# Patient Record
Sex: Male | Born: 1950
Health system: Southern US, Community
[De-identification: ages and names within clinical notes are randomized; demographics above are authoritative.]

## PROBLEM LIST (undated history)

## (undated) DIAGNOSIS — J302 Other seasonal allergic rhinitis: Secondary | ICD-10-CM

## (undated) DIAGNOSIS — E785 Hyperlipidemia, unspecified: Secondary | ICD-10-CM

## (undated) DIAGNOSIS — K219 Gastro-esophageal reflux disease without esophagitis: Secondary | ICD-10-CM

## (undated) DIAGNOSIS — I10 Essential (primary) hypertension: Secondary | ICD-10-CM

## (undated) DIAGNOSIS — M1990 Unspecified osteoarthritis, unspecified site: Secondary | ICD-10-CM

## (undated) DIAGNOSIS — D119 Benign neoplasm of major salivary gland, unspecified: Secondary | ICD-10-CM

## (undated) DIAGNOSIS — T884XXA Failed or difficult intubation, initial encounter: Secondary | ICD-10-CM

## (undated) DIAGNOSIS — D369 Benign neoplasm, unspecified site: Secondary | ICD-10-CM

## (undated) DIAGNOSIS — M5090 Cervical disc disorder, unspecified, unspecified cervical region: Secondary | ICD-10-CM

## (undated) DIAGNOSIS — K635 Polyp of colon: Secondary | ICD-10-CM

## (undated) HISTORY — PX: OTHER SURGICAL HISTORY: SHX169

## (undated) HISTORY — DX: Benign neoplasm, unspecified site: D36.9

## (undated) HISTORY — DX: Benign neoplasm of major salivary gland, unspecified: D11.9

## (undated) HISTORY — PX: LIPOMA EXCISION: SHX5283

## (undated) HISTORY — DX: Hyperlipidemia, unspecified: E78.5

## (undated) HISTORY — DX: Other seasonal allergic rhinitis: J30.2

## (undated) HISTORY — DX: Essential (primary) hypertension: I10

## (undated) HISTORY — DX: Polyp of colon: K63.5

---

## 1971-12-20 HISTORY — PX: KNEE SURGERY: SHX244

## 1993-12-19 HISTORY — PX: APPENDECTOMY: SHX54

## 1999-05-11 ENCOUNTER — Ambulatory Visit (HOSPITAL_COMMUNITY): Admission: RE | Admit: 1999-05-11 | Discharge: 1999-05-11 | Payer: Self-pay | Admitting: Orthopedic Surgery

## 1999-05-11 ENCOUNTER — Encounter: Payer: Self-pay | Admitting: Orthopedic Surgery

## 1999-05-25 ENCOUNTER — Encounter: Payer: Self-pay | Admitting: Orthopedic Surgery

## 1999-05-25 ENCOUNTER — Ambulatory Visit (HOSPITAL_COMMUNITY): Admission: RE | Admit: 1999-05-25 | Discharge: 1999-05-25 | Payer: Self-pay | Admitting: Orthopedic Surgery

## 1999-06-08 ENCOUNTER — Encounter: Payer: Self-pay | Admitting: Orthopedic Surgery

## 1999-06-08 ENCOUNTER — Ambulatory Visit (HOSPITAL_COMMUNITY): Admission: RE | Admit: 1999-06-08 | Discharge: 1999-06-08 | Payer: Self-pay | Admitting: Orthopedic Surgery

## 2000-05-09 ENCOUNTER — Emergency Department (HOSPITAL_COMMUNITY): Admission: EM | Admit: 2000-05-09 | Discharge: 2000-05-09 | Payer: Self-pay | Admitting: *Deleted

## 2001-07-11 ENCOUNTER — Ambulatory Visit (HOSPITAL_COMMUNITY): Admission: RE | Admit: 2001-07-11 | Discharge: 2001-07-11 | Payer: Self-pay | Admitting: Surgery

## 2001-07-11 ENCOUNTER — Encounter (INDEPENDENT_AMBULATORY_CARE_PROVIDER_SITE_OTHER): Payer: Self-pay | Admitting: Specialist

## 2001-07-11 ENCOUNTER — Encounter: Payer: Self-pay | Admitting: Surgery

## 2001-12-19 HISTORY — PX: INGUINAL HERNIA REPAIR: SUR1180

## 2004-12-19 DIAGNOSIS — K635 Polyp of colon: Secondary | ICD-10-CM

## 2004-12-19 HISTORY — DX: Polyp of colon: K63.5

## 2004-12-19 HISTORY — PX: COLONOSCOPY W/ POLYPECTOMY: SHX1380

## 2005-01-13 ENCOUNTER — Ambulatory Visit (HOSPITAL_COMMUNITY): Admission: RE | Admit: 2005-01-13 | Discharge: 2005-01-13 | Payer: Self-pay | Admitting: *Deleted

## 2005-01-13 ENCOUNTER — Encounter (INDEPENDENT_AMBULATORY_CARE_PROVIDER_SITE_OTHER): Payer: Self-pay | Admitting: *Deleted

## 2005-09-23 ENCOUNTER — Ambulatory Visit (HOSPITAL_COMMUNITY): Admission: RE | Admit: 2005-09-23 | Discharge: 2005-09-23 | Payer: Self-pay | Admitting: Orthopedic Surgery

## 2005-09-23 ENCOUNTER — Ambulatory Visit (HOSPITAL_BASED_OUTPATIENT_CLINIC_OR_DEPARTMENT_OTHER): Admission: RE | Admit: 2005-09-23 | Discharge: 2005-09-23 | Payer: Self-pay | Admitting: Orthopedic Surgery

## 2005-12-19 HISTORY — PX: KNEE ARTHROSCOPY: SUR90

## 2007-07-19 ENCOUNTER — Emergency Department (HOSPITAL_COMMUNITY): Admission: EM | Admit: 2007-07-19 | Discharge: 2007-07-19 | Payer: Self-pay | Admitting: Emergency Medicine

## 2007-12-20 HISTORY — PX: REPLACEMENT TOTAL KNEE: SUR1224

## 2008-08-20 ENCOUNTER — Inpatient Hospital Stay (HOSPITAL_COMMUNITY): Admission: RE | Admit: 2008-08-20 | Discharge: 2008-08-23 | Payer: Self-pay | Admitting: Orthopedic Surgery

## 2008-08-24 ENCOUNTER — Inpatient Hospital Stay (HOSPITAL_COMMUNITY): Admission: EM | Admit: 2008-08-24 | Discharge: 2008-08-26 | Payer: Self-pay | Admitting: Emergency Medicine

## 2008-08-26 ENCOUNTER — Encounter (INDEPENDENT_AMBULATORY_CARE_PROVIDER_SITE_OTHER): Payer: Self-pay | Admitting: Orthopedic Surgery

## 2008-08-26 ENCOUNTER — Ambulatory Visit: Payer: Self-pay | Admitting: Surgery

## 2008-12-19 HISTORY — PX: CERVICAL LAMINECTOMY: SHX94

## 2009-04-23 ENCOUNTER — Encounter: Admission: RE | Admit: 2009-04-23 | Discharge: 2009-04-23 | Payer: Self-pay | Admitting: Orthopedic Surgery

## 2009-04-27 ENCOUNTER — Ambulatory Visit (HOSPITAL_COMMUNITY): Admission: RE | Admit: 2009-04-27 | Discharge: 2009-04-28 | Payer: Self-pay | Admitting: Neurosurgery

## 2009-06-12 ENCOUNTER — Encounter: Admission: RE | Admit: 2009-06-12 | Discharge: 2009-06-12 | Payer: Self-pay | Admitting: Neurosurgery

## 2010-02-26 ENCOUNTER — Encounter (INDEPENDENT_AMBULATORY_CARE_PROVIDER_SITE_OTHER): Payer: Self-pay | Admitting: *Deleted

## 2010-03-04 ENCOUNTER — Ambulatory Visit: Payer: Self-pay | Admitting: Internal Medicine

## 2010-03-04 DIAGNOSIS — Z86010 Personal history of colonic polyps: Secondary | ICD-10-CM

## 2010-03-04 DIAGNOSIS — I10 Essential (primary) hypertension: Secondary | ICD-10-CM

## 2010-03-04 DIAGNOSIS — J309 Allergic rhinitis, unspecified: Secondary | ICD-10-CM

## 2010-03-04 DIAGNOSIS — E785 Hyperlipidemia, unspecified: Secondary | ICD-10-CM | POA: Insufficient documentation

## 2010-03-09 ENCOUNTER — Encounter (INDEPENDENT_AMBULATORY_CARE_PROVIDER_SITE_OTHER): Payer: Self-pay | Admitting: *Deleted

## 2010-03-18 ENCOUNTER — Encounter (INDEPENDENT_AMBULATORY_CARE_PROVIDER_SITE_OTHER): Payer: Self-pay | Admitting: *Deleted

## 2010-03-22 ENCOUNTER — Ambulatory Visit: Payer: Self-pay | Admitting: Gastroenterology

## 2010-03-31 ENCOUNTER — Ambulatory Visit: Payer: Self-pay | Admitting: Gastroenterology

## 2010-07-08 ENCOUNTER — Ambulatory Visit: Payer: Self-pay | Admitting: Internal Medicine

## 2010-07-13 ENCOUNTER — Encounter: Payer: Self-pay | Admitting: Internal Medicine

## 2010-07-21 ENCOUNTER — Ambulatory Visit: Payer: Self-pay | Admitting: Internal Medicine

## 2011-01-16 LAB — CONVERTED CEMR LAB
BUN: 22 mg/dL (ref 6–23)
Calcium: 10.3 mg/dL (ref 8.4–10.5)
GFR calc non Af Amer: 76.44 mL/min (ref >60)
Glucose, Bld: 99 mg/dL (ref 70–99)
Hgb A1c MFr Bld: 5.8 % (ref 4.6–6.5)

## 2011-01-18 NOTE — Assessment & Plan Note (Signed)
Summary: new to est//bcbs ins//lch   Vital Signs:  Patient profile:   60 year old male Height:      67.25 inches Weight:      192.4 pounds BMI:     30.02 Temp:     98.6 degrees F oral Pulse rate:   64 / minute Resp:     14 per minute BP sitting:   120 / 76  (left arm) Cuff size:   large  Vitals Entered By: Shonna Chock (March 04, 2010 2:03 PM)  CC: New Patient , General Medical Evaluation   CC:  New Patient  and General Medical Evaluation.  History of Present Illness: Ryan Cobb is here  to establish as a new patient ; Lipitor 20 mg  was Rxed 30 days ago for LDL  of 147. Previously on Red Yeast Rice; LDL gradually increased since he quit running.  Preventive Screening-Counseling & Management  Alcohol-Tobacco     Smoking Status: quit  Caffeine-Diet-Exercise     Does Patient Exercise: yes  Allergies (verified): No Known Drug Allergies  Past History:  Past Medical History: Allergic rhinitis Hyperlipidemia Hypertension Colonic polyps, hx of  Past Surgical History: R knee arthroscopy 2007;L knee reconstruction 1973;   L Total knee replacement 2009; Lipoma ectomy R waist Cervical laminectomy 2010, Dr Channing Mutters Inguinal herniorrhaphy Colon polypectomy 2006, due 2011  Family History: Father: Deceased-Alcoholism, DM;  PGM :Elevated lipids;PGF:Elevated lipids,Angina,HTN,  lung CA Mother: Deceased-Alcoholism, cirrhosis; MGM:neg; MGF : lipids,CVA Siblings:  sister : Breast Cancer  Social History: 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[employer] Married Former Smoker total < 20 yrs(off for 25 yr span) Alcohol use-no Regular exercise-yes: biking, elliptical Smoking Status:  quit Does Patient Exercise:  yes  Review of Systems  The patient denies anorexia, fever, vision loss, decreased hearing, hoarseness, chest pain, syncope, dyspnea on exertion, peripheral edema, prolonged cough, headaches, hemoptysis, abdominal pain, melena, hematochezia, severe indigestion/heartburn, hematuria,  incontinence, suspicious skin lesions, depression, unusual weight change, abnormal bleeding, enlarged lymph nodes, and angioedema.         Weight up 10# over past year. Nose bleeds in Winter. Lexapro was initiated in 2009 due to work & finances stresses, "situational anxiety". He has taken it irregularly.  Physical Exam  General:  well-nourished; alert,appropriate and cooperative throughout examination Head:  Normocephalic and atraumatic without obvious abnormalities.  Eyes:  No corneal or conjunctival inflammation noted.Marland Kitchen Perrla. Funduscopic exam benign, without hemorrhages, exudates or papilledema.  Ears:  External ear exam shows no significant lesions or deformities.  Otoscopic examination reveals clear canals, tympanic membranes are intact bilaterally without bulging, retraction, inflammation or discharge. Hearing is grossly normal bilaterally. Nose:  External nasal examination shows no deformity or inflammation. Nasal mucosa are pink and moist without lesions or exudates. Mouth:  Oral mucosa and oropharynx without lesions or exudates.  Teeth in good repair. Neck:  No deformities, masses, or tenderness noted. Lungs:  Normal respiratory effort, chest expands symmetrically. Lungs are clear to auscultation, no crackles or wheezes. Heart:  Normal rate and regular rhythm.Increased  S1 ; S2 normal without gallop, murmur, click, rub or other extra sounds. Abdomen:  Bowel sounds positive,abdomen soft and non-tender without masses, organomegaly or hernias noted. Prostate:  Done in Fall; PSA was normal Pulses:  R and L carotid,radial,dorsalis pedis and posterior tibial pulses are full and equal bilaterally Extremities:  No clubbing, cyanosis, edema. SEVERE crepitus L knee Neurologic:  alert & oriented X3 and DTRs symmetrical and normal.   Skin:  Intact without suspicious lesions or rashes  Cervical Nodes:  No lymphadenopathy noted Axillary Nodes:  No palpable lymphadenopathy Psych:  memory intact for  recent and remote, normally interactive, good eye contact, not anxious appearing, and not depressed appearing.     Impression & Recommendations:  Problem # 1:  HYPERLIPIDEMIA (ICD-272.4)  The following medications were removed from the medication list:    Lipitor 20 Mg Tabs (Atorvastatin calcium) .Marland Kitchen... 1 by mouth once daily  Problem # 2:  HYPERTENSION (ICD-401.9) controlled His updated medication list for this problem includes:    Lisinopril-hydrochlorothiazide 20-25 Mg Tabs (Lisinopril-hydrochlorothiazide) .Marland Kitchen... 1 by mouth once daily  Problem # 3:  COLONIC POLYPS, HX OF (ICD-V12.72) F/U as per GI  Problem # 4:  ALLERGIC RHINITIS (ICD-477.9)  His updated medication list for this problem includes:    Loratadine 10 Mg Tabs (Loratadine) .Marland Kitchen... 1 by mouth once daily  Complete Medication List: 1)  Singulair 10 Mg Tabs (Montelukast sodium) .Marland Kitchen.. 1 by mouth once daily 2)  Lisinopril-hydrochlorothiazide 20-25 Mg Tabs (Lisinopril-hydrochlorothiazide) .Marland Kitchen.. 1 by mouth once daily 3)  Lexapro 5 Mg Tabs (Escitalopram oxalate) .Marland Kitchen.. 1 by mouth once daily 4)  Aspirin 81 Mg Tabs (Aspirin) .Marland Kitchen.. 1 by mouth once daily 5)  Loratadine 10 Mg Tabs (Loratadine) .Marland Kitchen.. 1 by mouth once daily  Patient Instructions: 1)  Read Dr Gildardo Griffes Eat,Drink & Be Healthy. CVE 3-4 X/week X 30-45 min. 2)  Please schedule a follow-up appointment in 4 months. 3)  BMP prior to visit, ICD-9:401.9  4)  NMR Lipoprofile Lipid Panel prior to visit, ICD-9:272.4 5)  HbgA1C prior to visit, ICD-9:272.4. Weaning trial of Lexapro. 6)  Schedule a colonoscopy as per [facility]  as discussed  to help detect colon cancer. 7)  Take  3  coated  Aspirin 81 mg  every day. Prescriptions: LISINOPRIL-HYDROCHLOROTHIAZIDE 20-25 MG TABS (LISINOPRIL-HYDROCHLOROTHIAZIDE) 1 by mouth once daily  #90 x 1   Entered and Authorized by:   Marga Melnick MD   Signed by:   Marga Melnick MD on 03/04/2010   Method used:   Faxed to ...       [facility]*  (retail)       [address]       [place], [place]  [id number]       Ph: [phone]       Fax: [phone]   RxID:   [id number] SINGULAIR 10 MG TABS (MONTELUKAST SODIUM) 1 by mouth once daily  #90 x 3   Entered and Authorized by:   Marga Melnick MD   Signed by:   Marga Melnick MD on 03/04/2010   Method used:   Faxed to ...       [facility]* (retail)       [address]       [place], [place]  [address]       Ph: [phone]       Fax: [phone]   RxID:   [id number]

## 2011-01-18 NOTE — Letter (Signed)
Summary: New Patient Letter  [facility] [place]  [address] [place], [place] [address]   Phone: [phone]  Fax: [phone]       02/26/2010 MRN: [id number]  Ryan Cobb [address] [place], [place]  [address]  Dear [name],   Welcome to [facility] and thank you for choosing [place] as your Primary Care Providers. Enclosed you will find information about our practice that we hope you find helpful. We have also enclosed forms to be filled out prior to your visit. This will provide [place] with the necessary information and facilitate your being seen in a timely manner. If you have any questions, please call us at:  [phone] and we will be happy to assist you. We look forward to seeing you at your scheduled appointment time.  Appointment   Thursday Mrch 17th @ 2:00pm  with Dr.  Alwyn Ren         Sincerely,  Primary Health Care Team  Please arrive 15 minutes early for your first appointment and bring your insurance card. Co-pay is required at the time of your visit.  *****Please call the office if you are not able to keep this appointment. There is a charge of $50.00 if any appointment is not cancelled or rescheduled within 24 hours.

## 2011-01-18 NOTE — Miscellaneous (Signed)
Summary: previsit  Clinical Lists Changes  Medications: Added new medication of MOVIPREP 100 GM  SOLR (PEG-KCL-NACL-NASULF-NA ASC-C) As directed - Signed Rx of MOVIPREP 100 GM  SOLR (PEG-KCL-NACL-NASULF-NA ASC-C) As directed;  #1 x 0;  Signed;  Entered by: Clide Cliff RN;  Authorized by: Mardella Layman MD [facility];  Method used: Electronically to [facility]*, [address] [place], [place]  [address], Ph: [phone], Fax: [phone] Observations: Added new observation of ALLERGY REV: Done (03/22/2010 16:14)    Prescriptions: MOVIPREP 100 GM  SOLR (PEG-KCL-NACL-NASULF-NA ASC-C) As directed  #1 x 0   Entered by:   Clide Cliff RN   Authorized by:   Mardella Layman MD [facility]   Signed by:   Clide Cliff RN on 03/22/2010   Method used:   Electronically to        [facility]* (retail)       [address]       [place], [place]  [address]       Ph: [phone]       Fax: [phone]   RxID:   [id number]

## 2011-01-18 NOTE — Procedures (Signed)
Summary: Colonoscopy  Patient: Ryan Cobb Note: All result statuses are Final unless otherwise noted.  Tests: (1) Colonoscopy (COL)   COL Colonoscopy           DONE     [facility]     [address]     [place], [place]  [address]           COLONOSCOPY PROCEDURE REPORT           PATIENT:  Ryan Cobb, Ryan Cobb  MR#:  [id number]     BIRTHDATE:  1951/06/17, 59 yrs. old  GENDER:  male     ENDOSCOPIST:  Vania Rea. Jarold Motto, MD, [facility]     REF. BY:  Marga Melnick, M.D.     PROCEDURE DATE:  03/31/2010     PROCEDURE:  Higher-risk screening colonoscopy G0105           ASA CLASS:  Class II     INDICATIONS:  history of polyps     MEDICATIONS:   Fentanyl 50 mcg IV, Versed 5 mg IV           DESCRIPTION OF PROCEDURE:   After the risks benefits and     alternatives of the procedure were thoroughly explained, informed     consent was obtained.  Digital rectal exam was performed and     revealed no abnormalities.   The LB [id number] endoscope     was introduced through the anus and advanced to the cecum, which     was identified by both the appendix and ileocecal valve, without     limitations.  The quality of the prep was excellent, using     MoviPrep.  The instrument was then slowly withdrawn as the colon     was fully examined.     <<PROCEDUREIMAGES>>           FINDINGS:  Moderate diverticulosis was found in the sigmoid to     descending colon segments.  No polyps or cancers were seen.  This     was otherwise a normal examination of the colon.   Retroflexed     views in the rectum revealed no abnormalities.    The scope was     then withdrawn from the patient and the procedure completed.           COMPLICATIONS:  None     ENDOSCOPIC IMPRESSION:     1) Moderate diverticulosis in the sigmoid to descending colon     segments     2) No polyps or cancers     3) Otherwise normal examination     RECOMMENDATIONS:     1) high fiber diet     2) Follow up colonoscopy in 5  years     REPEAT EXAM:  No           ______________________________     Vania Rea. Jarold Motto, MD, [facility]           CC:           n.     eSIGNED:   Vania Rea. Camaron Cammack at 03/31/2010 12:04 PM           [facility], [id number]  Note: An exclamation mark (!) indicates a result that was not dispersed into the flowsheet. Document Creation Date: 03/31/2010 12:05 PM _______________________________________________________________________  (1) Order result status: Final Collection or observation date-time: 03/31/2010 12:00 Requested date-time:  Receipt date-time:  Reported date-time:  Referring Physician:   Ordering Physician: Sheryn Bison (  [id number]) Specimen Source:  Source: Launa Grill Order Number: [id number] Lab site:   Appended Document: Colonoscopy    Clinical Lists Changes  Observations: Added new observation of COLONNXTDUE: 03/2015 (03/31/2010 14:19)

## 2011-01-18 NOTE — Assessment & Plan Note (Signed)
Summary: review lipid profile copy at chraes desk/cbs   Vital Signs:  Patient profile:   60 year old male Height:      67.25 inches Weight:      178 pounds Temp:     98.5 degrees F oral Pulse rate:   72 / minute Resp:     12 per minute BP sitting:   110 / 64  (left arm)  Vitals Entered By: Jeremy Johann CMA (July 21, 2010 1:16 PM) CC: review labs, Lipid Management Comments copy of labs given to pt   CC:  review labs and Lipid Management.  History of Present Illness: NMR Lipoprofile reviewed & risks discussed. CVE as elliptical > 30 min/ day  6 X/week & weights ,w/o symptoms.  He is on  decreased carb , heart healthy diet with 17# weight loss.  LDL previously 147 on Red Yeast Rice. He took Lipitor for < 30 days. Hypertension Follow-Up      This is a 60 year old man who also  presents for Hypertension follow-up.  The patient denies lightheadedness, urinary frequency, headaches, and fatigue.  The patient denies the following associated symptoms: exercise intolerance, syncope, leg edema, and pedal edema.  Compliance with medications (by patient report) has been near 100%.  Adjunctive measures currently used by the patient include salt restriction.   He is not monitoring BP.  Lipid Management History:      Positive NCEP/ATP III risk factors include male age 61 years old or older and hypertension.  Negative NCEP/ATP III risk factors include non-diabetic, no family history for ischemic heart disease, non-tobacco-user status, no ASHD (atherosclerotic heart disease), no prior stroke/TIA, no peripheral vascular disease, and no history of aortic aneurysm.     Current Medications (verified): 1)  Singulair 10 Mg Tabs (Montelukast Sodium) .Marland Kitchen.. 1 By Mouth Once Daily 2)  Lisinopril-Hydrochlorothiazide 20-25 Mg Tabs (Lisinopril-Hydrochlorothiazide) .Marland Kitchen.. 1 By Mouth Once Daily 3)  Aspirin 81 Mg Tabs (Aspirin) .Marland Kitchen.. 1 By Mouth Once Daily 4)  Loratadine 10 Mg Tabs (Loratadine) .Marland Kitchen.. 1 By Mouth Once  Daily  Allergies (verified): No Known Drug Allergies  Past History:  Past Medical History: Allergic rhinitis Hyperlipidemia: NMR 2011: LDL 98 (1665/ 396), HDL 57, TG 55. LDL goal =< 80. Framingham Study LDL goal = < 130. Hypertension Colonic polyps, hx of  Review of Systems CV:  Denies chest pain or discomfort, leg cramps with exertion, palpitations, shortness of breath with exertion, swelling of feet, and swelling of hands. Resp:  Denies cough. Allergy:  Denies hives or rash; No angioedema.  Physical Exam  General:  well-nourished; alert,appropriate and cooperative throughout examination Lungs:  Normal respiratory effort, chest expands symmetrically. Lungs are clear to auscultation, no crackles or wheezes. Heart:  Normal rate and regular rhythm. S1 and S2 normal without gallop, murmur, click, rub . S4 Abdomen:  Bowel sounds positive,abdomen soft and non-tender without masses, organomegaly or hernias noted. No AAA or bruits Pulses:  R and L carotid,radial,dorsalis pedis and posterior tibial pulses are full and equal bilaterally Extremities:  No clubbing, cyanosis, edema. Neurologic:  alert & oriented X3.   Skin:  Intact without suspicious lesions or rashes Psych:  memory intact for recent and remote, normally interactive, and good eye contact.   Focused & intelligent   Impression & Recommendations:  Problem # 1:  HYPERLIPIDEMIA (ICD-272.4)  Problem # 2:  HYPERTENSION (ICD-401.9)  Complete Medication List: 1)  Singulair 10 Mg Tabs (Montelukast sodium) .Marland Kitchen.. 1 by mouth once daily 2)  Lisinopril-hydrochlorothiazide 20-12.5 Mg Tabs  .Marland KitchenMarland Kitchen. 1 by mouth once daily 3)  Aspirin 81 Mg Tabs (Aspirin) .Marland Kitchen.. 1 by mouth once daily 4)  Loratadine 10 Mg Tabs (Loratadine) .Marland Kitchen.. 1 by mouth once daily  Lipid Assessment/Plan:      Based on NCEP/ATP III, the patient's risk factor category is "2 or more risk factors and a calculated 10 year CAD risk of > 20%".  The patient's lipid goals are as  follows: Total cholesterol goal is 200; LDL cholesterol goal is 130; HDL cholesterol goal is 40; Triglyceride goal is 150.    Patient Instructions: 1)  Monitor Lipids annually .Check your Blood Pressure regularly. If it is above: 135/85 ON AVERAGE on thee reduced dose you should make an appointment. Prescriptions: LISINOPRIL-HYDROCHLOROTHIAZIDE 20-12.5  MG TABS 1 by mouth once daily  #90 x 1   Entered and Authorized by:   Marga Melnick MD   Signed by:   Marga Melnick MD on 07/21/2010   Method used:   Print then Give to Patient   RxID:   [id number]

## 2011-01-18 NOTE — Letter (Signed)
Summary: Previsit letter  [facility]  [address] [place], [place] [address]   Phone: [phone]  Fax: [phone]       03/09/2010 MRN: [id number]  Ryan Cobb [address] [place], [place]  [address]  Dear [name],  Welcome to the Gastroenterology Division at [facility].    You are scheduled to see a nurse for your pre-procedure visit on 03/22/2010 at 4:30PM on the 3rd floor at [facility], [address]  We ask that you try to arrive at our office 15 minutes prior to your appointment time to allow for check-in.  Your nurse visit will consist of discussing your medical and surgical history, your immediate family medical history, and your medications.    Please bring a complete list of all your medications or, if you prefer, bring the medication bottles and we will list them.  We will need to be aware of both prescribed and over the counter drugs.  We will need to know exact dosage information as well.  If you are on blood thinners (Coumadin, Plavix, Aggrenox, Ticlid, etc.) please call our office today/prior to your appointment, as we need to consult with your physician about holding your medication.   Please be prepared to read and sign documents such as consent forms, a financial agreement, and acknowledgement forms.  If necessary, and with your consent, a friend or relative is welcome to sit-in on the nurse visit with you.  Please bring your insurance card so that we may make a copy of it.  If your insurance requires a referral to see a specialist, please bring your referral form from your primary care physician.  No co-pay is required for this nurse visit.     If you cannot keep your appointment, please call [phone] to cancel or reschedule prior to your appointment date.  This allows [place] the opportunity to schedule an appointment for another patient in need of care.    Thank you for choosing  Gastroenterology for your  medical needs.  We appreciate the opportunity to care for you.  Please visit [place] at our website  to learn more about our practice.                     Sincerely.                                                                                                                   The Gastroenterology Division

## 2011-01-18 NOTE — Letter (Signed)
Summary: [facility] Instructions  [facility]  [address] [place], [place] [address]   Phone: [phone]  Fax: [phone]       Ryan Cobb    May 25, 1951    MRN: [id number]        Procedure Day /Date: 03/31/2010     Arrival Time: 10:30AM      Procedure Time: 11:30AM     Location of Procedure:                    X  [facility] (4th Floor)                        PREPARATION FOR COLONOSCOPY WITH MOVIPREP   Starting 5 days prior to your procedure, 03/26/2010, do not eat nuts, seeds, popcorn, corn, beans, peas,  salads, or any raw vegetables.  Do not take any fiber supplements (e.g. Metamucil, Citrucel, and Benefiber).  THE DAY BEFORE YOUR PROCEDURE         DATE: 03/30/2010  DAY: Tuesday  1.  Drink clear liquids the entire day-NO SOLID FOOD  2.  Do not drink anything colored red or purple.  Avoid juices with pulp.  No orange juice.  3.  Drink at least 64 oz. (8 glasses) of fluid/clear liquids during the day to prevent dehydration and help the prep work efficiently.  CLEAR LIQUIDS INCLUDE: Water Jello Ice Popsicles Tea (sugar ok, no milk/cream) Powdered fruit flavored drinks Coffee (sugar ok, no milk/cream) Gatorade Juice: apple, white grape, white cranberry  Lemonade Clear bullion, consomm, broth Carbonated beverages (any kind) Strained chicken noodle soup Hard Candy                             4.  In the morning, mix first dose of MoviPrep solution:    Empty 1 Pouch A and 1 Pouch B into the disposable container    Add lukewarm drinking water to the top line of the container. Mix to dissolve    Refrigerate (mixed solution should be used within 24 hrs)  5.  Begin drinking the prep at 5:00 p.m. The MoviPrep container is divided by 4 marks.   Every 15 minutes drink the solution down to the next mark (approximately 8 oz) until the full liter is complete.   6.  Follow completed prep with 16 oz of clear liquid of your choice  (Nothing red or purple).  Continue to drink clear liquids until bedtime.  7.  Before going to bed, mix second dose of MoviPrep solution:    Empty 1 Pouch A and 1 Pouch B into the disposable container    Add lukewarm drinking water to the top line of the container. Mix to dissolve    Refrigerate  THE DAY OF YOUR PROCEDURE      DATE: 03/31/2010 DAY: Wednesday  Beginning at 6:30AM (5 hours before procedure):         1. Every 15 minutes, drink the solution down to the next mark (approx 8 oz) until the full liter is complete.  2. Follow completed prep with 16 oz. of clear liquid of your choice.    3. You may drink clear liquids until 09:30AM (2 HOURS BEFORE PROCEDURE).   MEDICATION INSTRUCTIONS  Unless otherwise instructed, you should take regular prescription medications with a small sip of water   as early as possible the morning of your procedure.  OTHER INSTRUCTIONS  You will need a responsible adult at least 61 years of age to accompany you and drive you home.   This person must remain in the waiting room during your procedure.  Wear loose fitting clothing that is easily removed.  Leave jewelry and other valuables at home.  However, you may wish to bring a book to read or  an iPod/MP3 player to listen to music as you wait for your procedure to start.  Remove all body piercing jewelry and leave at home.  Total time from sign-in until discharge is approximately 2-3 hours.  You should go home directly after your procedure and rest.  You can resume normal activities the  day after your procedure.  The day of your procedure you should not:   Drive   Make legal decisions   Operate machinery   Drink alcohol   Return to work  You will receive specific instructions about eating, activities and medications before you leave.    The above instructions have been reviewed and explained to me by   Clide Cliff, RN______________________    I fully  understand and can verbalize these instructions _____________________________ Date _________

## 2011-01-21 ENCOUNTER — Encounter: Payer: Self-pay | Admitting: Internal Medicine

## 2011-01-21 ENCOUNTER — Ambulatory Visit (INDEPENDENT_AMBULATORY_CARE_PROVIDER_SITE_OTHER): Payer: PRIVATE HEALTH INSURANCE | Admitting: Internal Medicine

## 2011-01-21 DIAGNOSIS — J209 Acute bronchitis, unspecified: Secondary | ICD-10-CM

## 2011-01-21 DIAGNOSIS — J069 Acute upper respiratory infection, unspecified: Secondary | ICD-10-CM | POA: Insufficient documentation

## 2011-01-26 NOTE — Assessment & Plan Note (Signed)
Summary: sore throat-congested/cbs   Vital Signs:  Patient profile:   60 year old male Weight:      171.4 pounds BMI:     26.74 Temp:     98.4 degrees F oral Pulse rate:   72 / minute Resp:     13 per minute BP sitting:   126 / 80  (left arm) Cuff size:   large  Vitals Entered By: Shonna Chock CMA (January 21, 2011 10:25 AM) CC: Congestion and sinus discomfort, URI symptoms   CC:  Congestion and sinus discomfort and URI symptoms.  History of Present Illness:    Onset as ST & burning in sinsuses 2 weeks ago;essential resolution by 01/27. On 01/30 same symptoms recurred with congestion.  The patient  now reports productive cough with scant yellow, but denies purulent nasal discharge and earache.  Associated symptoms include dyspnea with stairs.  The patient denies fever and wheezing.  The patient also reports frontal  headache.  Risk factors for Strep sinusitis include bilateral facial pain and double sickening.  The patient denies the following risk factors for Strep sinusitis: tooth pain and tender adenopathy. Rx: Claritin D, Zicam, Aleve.   Current Medications (verified): 1)  Singulair 10 Mg Tabs (Montelukast Sodium) .Marland Kitchen.. 1 By Mouth Once Daily 2)  Lisinopril-Hydrochlorothiazide 20-12.5  Mg Tabs .Marland Kitchen.. 1 By Mouth Once Daily 3)  Aspirin 81 Mg Tabs (Aspirin) .... 3 By Mouth Once Daily 4)  Loratadine 10 Mg Tabs (Loratadine) .Marland Kitchen.. 1 By Mouth Once Daily  Allergies (verified): No Known Drug Allergies  Physical Exam  General:  in no acute distress; alert,appropriate and cooperative throughout examination Ears:  External ear exam shows no significant lesions or deformities.  Otoscopic examination reveals clear canals, tympanic membranes are intact bilaterally without bulging, retraction, inflammation or discharge. Hearing is grossly normal bilaterally. Nose:  External nasal examination shows no deformity or inflammation. Nasal mucosa are  dry without lesions or exudates. Mouth:  Oral  mucosa and oropharynx without lesions or exudates.  Teeth in good repair. Slightly hoarse Lungs:  Normal respiratory effort, chest expands symmetrically. Lungs are clear to auscultation, no crackles or wheezes. Heart:  Normal rate and regular rhythm. S1 and S2 normal without gallop, murmur, click, rub.S4 Cervical Nodes:  No lymphadenopathy noted Axillary Nodes:  No palpable lymphadenopathy   Impression & Recommendations:  Problem # 1:  BRONCHITIS-ACUTE (ICD-466.0)  His updated medication list for this problem includes:    Singulair 10 Mg Tabs (Montelukast sodium) .Marland Kitchen... 1 by mouth once daily    Amoxicillin-pot Clavulanate 875-125 Mg Tabs (Amoxicillin-pot clavulanate) .Marland Kitchen... 1 every 12 hrs with food  Problem # 2:  URI (ICD-465.9) Sinusitis suggested His updated medication list for this problem includes:    Aspirin 81 Mg Tabs (Aspirin) .Marland KitchenMarland KitchenMarland KitchenMarland Kitchen 3 by mouth once daily    Loratadine 10 Mg Tabs (Loratadine) .Marland Kitchen... 1 by mouth once daily  Complete Medication List: 1)  Singulair 10 Mg Tabs (Montelukast sodium) .Marland Kitchen.. 1 by mouth once daily 2)  Lisinopril-hydrochlorothiazide 20-12.5 Mg Tabs  .Marland KitchenMarland Kitchen. 1 by mouth once daily 3)  Aspirin 81 Mg Tabs (Aspirin) .... 3 by mouth once daily 4)  Loratadine 10 Mg Tabs (Loratadine) .Marland Kitchen.. 1 by mouth once daily 5)  Amoxicillin-pot Clavulanate 875-125 Mg Tabs (Amoxicillin-pot clavulanate) .Marland Kitchen.. 1 every 12 hrs with food 6)  Fluticasone Propionate 50 Mcg/act Susp (Fluticasone propionate) .Marland Kitchen.. 1 spray two times a day as needed  Patient Instructions: 1)  Neti pot once daily - two times a day .  Stop decongestant. 2)  Drink as much NON dairy  fluid as you can tolerate for the next few days. Prescriptions: AMOXICILLIN-POT CLAVULANATE 875-125 MG TABS (AMOXICILLIN-POT CLAVULANATE) 1 every 12 hrs with food  #20 x 0   Entered and Authorized by:   Marga Melnick MD   Signed by:   Marga Melnick MD on 01/21/2011   Method used:   Electronically to        [facility]*  (retail)       [address]       [place], [place]  [id number]       Ph: [phone]       Fax: [phone]   RxID:   [id number]    Orders Added: 1)  Est. Patient Level III [24401]

## 2011-03-29 LAB — PROTIME-INR: INR: 1 (ref 0.00–1.49)

## 2011-03-29 LAB — CBC
HCT: 47 % (ref 39.0–52.0)
Hemoglobin: 16.2 g/dL (ref 13.0–17.0)
MCHC: 34.5 g/dL (ref 30.0–36.0)
MCV: 89.1 fL (ref 78.0–100.0)
RDW: 13.9 % (ref 11.5–15.5)

## 2011-03-29 LAB — COMPREHENSIVE METABOLIC PANEL
Alkaline Phosphatase: 56 U/L (ref 39–117)
BUN: 12 mg/dL (ref 6–23)
Calcium: 9.3 mg/dL (ref 8.4–10.5)
Creatinine, Ser: 0.89 mg/dL (ref 0.4–1.5)
Glucose, Bld: 87 mg/dL (ref 70–99)
Potassium: 3.6 mEq/L (ref 3.5–5.1)
Total Protein: 7.4 g/dL (ref 6.0–8.3)

## 2011-03-29 LAB — DIFFERENTIAL
Basophils Relative: 1 % (ref 0–1)
Lymphocytes Relative: 34 % (ref 12–46)
Monocytes Relative: 7 % (ref 3–12)
Neutro Abs: 4.3 10*3/uL (ref 1.7–7.7)
Neutrophils Relative %: 56 % (ref 43–77)

## 2011-03-29 LAB — TYPE AND SCREEN: Antibody Screen: NEGATIVE

## 2011-03-29 LAB — URINALYSIS, ROUTINE W REFLEX MICROSCOPIC
Bilirubin Urine: NEGATIVE
Ketones, ur: NEGATIVE mg/dL
Nitrite: NEGATIVE
Specific Gravity, Urine: 1.013 (ref 1.005–1.030)
Urobilinogen, UA: 0.2 mg/dL (ref 0.0–1.0)

## 2011-03-29 LAB — APTT: aPTT: 31 seconds (ref 24–37)

## 2011-04-11 ENCOUNTER — Other Ambulatory Visit: Payer: Self-pay | Admitting: *Deleted

## 2011-04-11 MED ORDER — MONTELUKAST SODIUM 10 MG PO TABS: 10.0000 mg | ORAL_TABLET | Freq: Every day | ORAL | Status: DC

## 2011-04-30 ENCOUNTER — Encounter: Payer: Self-pay | Admitting: Internal Medicine

## 2011-05-02 ENCOUNTER — Encounter: Payer: Self-pay | Admitting: Internal Medicine

## 2011-05-02 ENCOUNTER — Ambulatory Visit (INDEPENDENT_AMBULATORY_CARE_PROVIDER_SITE_OTHER): Payer: PRIVATE HEALTH INSURANCE | Admitting: Internal Medicine

## 2011-05-02 DIAGNOSIS — H9190 Unspecified hearing loss, unspecified ear: Secondary | ICD-10-CM

## 2011-05-02 DIAGNOSIS — H9390 Unspecified disorder of ear, unspecified ear: Secondary | ICD-10-CM

## 2011-05-02 DIAGNOSIS — H9319 Tinnitus, unspecified ear: Secondary | ICD-10-CM

## 2011-05-02 DIAGNOSIS — Z01118 Encounter for examination of ears and hearing with other abnormal findings: Secondary | ICD-10-CM

## 2011-05-02 NOTE — Progress Notes (Signed)
  Subjective:    Patient ID: Ryan Cobb, male    DOB: Sep 18, 1951, 60 y.o.   MRN: [id number]  HPI He has had some chronic hearing loss; he feels this has been progressive over the last several months. Additionally he has some tinnitus. He denies headache or vertigo. He's had no signs of significant rhinosinusitis. The major symptoms are lack of discrimination in crowds; this is especially problematic at church      Review of Systems     Objective:   Physical Exam   On exam he is in no acute distress. Pupils are equal round reactive to light. Extraocular motions are intact. The  Otolaryngologic  exam was unremarkable visually. He does have decreased  whisper at 6 feet, worse on the right than the left. The tuning fork exam suggests  lateralization to the right ear. Dental  hygiene is good; there are no exudates. He has no lymphadenopathy about the neck or axilla. Thyroid is normal to palpation. Gait is normal; Romberg and finger to nose testing are normal.        Assessment & Plan:  #1 hearing loss; this is a chronic issue which has become progressive over the last several months  #2 tinnitus  #3 abnormal tuning fork exam with lateralization to the right with bone conduction  Plan:  Audiology  referral

## 2011-05-02 NOTE — Patient Instructions (Signed)
Please review this note; add any  additional symptoms or signs that you've noted.

## 2011-05-03 NOTE — Op Note (Signed)
NAME:  Ryan Cobb, Ryan Cobb             ACCOUNT NO.:  [id number]   MEDICAL RECORD NO.:  [id number]          PATIENT TYPE:  INP   LOCATION:  5035                         FACILITY:  [facility]   PHYSICIAN:  Harvie Junior, M.D.   DATE OF BIRTH:  May 11, 1951   DATE OF PROCEDURE:  08/20/2008  DATE OF DISCHARGE:                               OPERATIVE REPORT   PREOPERATIVE DIAGNOSIS:  End-stage degenerative joint disease, left  knee.   POSTOPERATIVE DIAGNOSIS:  End-stage degenerative joint disease, left  knee.   PRINCIPAL PROCEDURE:  1. Left total knee replacement with a sigma system, size 4 femur, size      4 tibia, 10-mm bridging bearing and a 38-mm all polyethylene      patella.  2. Computer-assisted left total knee replacement.   SURGEON:  Harvie Junior, MD   ASSISTANT:  Marshia Ly, PA   ANESTHESIA:  General.   BRIEF HISTORY:  Ryan Cobb is a 60 year old male with long history of  having had a open meniscectomy many years ago.  He had off and on again  significant pain on the medial side and because of continuing complaints  of pain, he was evaluated and treated conservatively for a period of  time.  Because of continuing complaints of pain, he was also taken to  the operating room for operative fixation of his knee.   PROCEDURE:  The patient was taken to the operating room.  After adequate  anesthesia was obtained with general anesthetic, the patient was placed  supine upon the operating table, the left leg was prepped and draped in  usual sterile fashion.  Following this, the leg was exsanguinated and  the blood pressure insufflated to 350 mmHg.  Following this, a midline  incision was made with subcutaneous tissue was taken down to the level  of extensor mechanism.  Medial parapatellar pleurotomy was undertaken  and attention was then turned to the knee where medial and lateral  meniscus were removed.  A retropatellar fat pad was excised, a anterior  and posterior cruciates  were excised as well as medial and lateral  meniscus.  At this point, attention was turned towards exposing the knee  and allowing for the computer assistance modules to be placed and at  this point, with the computer assistance module, I placed 2 pins in the  tibia and 2 pins in the femur.  The arrays were placed and then the  registration process was undertaken, this had about 30 minutes to the  surgical procedure.  Once this was completed, the knee was then cut  perpendicular to the long axis under computer assistance technique.  The  femur was then cut perpendicular to the anatomic axis under computer  assistance.  Once this was completed, the spacer blocks were put in  place and we could get excellent full extension.  At this point,  attention was then turned to sizing the femur.  The femur was sized to a  size 5 and was cut anterior and posterior, and the chamfers and box were  cut.  At this point, the guides were used and  it just was very tight in  flexion.  At this point, we felt that we needed to downsize the femur to  a 4 to get better opening and then flexion.  The 4 block was then placed  at an anterior reference and the posterior portion was then opened up.  At this point, this phase block could be placed easily in flexion and  the patient was then turned towards back to the 4 where the block was  used anterior and posterior cuts, and chamfers were performed.  At that  point, attention was then turned towards the tibia, tibia was sized to a  size 4 and it was drilled and keeled, and then attention was turned  towards the 10-mm bridging bearing, which was put in place and excellent  full extension was achieved.  Computer assistance showed as perfect  neutral long alignment, perfect gap balance in extension, and flexion.  There was symmetric gap balance.  At this point, attention was turned  towards the tibia where all trial components were removed.  The patella  was then cut  down to a level of 15 mm, which removed 11 mm of bone, and  I drilled for a 38-mm patella and a 38-mm trial was then placed.  All  the trials were removed.  The knee was copiously and thoroughly lavaged  and suctioned dry.  The attention was then turned towards the placement  of the final components after the knee was also was thoroughly irrigated  with pulsatile lavage irrigation and suctioned dry.  Attention was then  turned towards the drying out the bone interfaces and the final  components were cemented into place, size 4 femur, size 4 tibia, 10-mm  bridging bearing trial was placed to allow the cement to harden fully  and attention was then turned towards the patella where the 38 mm was  held with a clamp.  All excess bone cement was removed at this point.  Attention was then turned towards one of the cement that was allowed to  harden to the tourniquet being let down, there was one bleeder medially,  which was controlled and the remainder of the bleeding seemed minimal.  At this point, the knee was thoroughly lavaged and irrigated.  The trial  poly was removed and the final poly was put in place and the knee was  then reduced.  Excellent easy full range of motion.  Medial and lateral  gaps were neutral that the computer alignment module was then removed at  this point and the knee was then allowed to move very easily through a  range of motion.  The medium Hemovac drain was placed and medial  parapatellar arthrotomy.  At this point, the cement was allowed to  harden and the medial parapatellar arthrotomy was closed with one Vicryl  running suture.  The skin was then closed with 2-0 Vicryl and the skin  with staples.  Sterile compressive dressing was applied and the patient  was taken to the recovery room and was noted to be in satisfactory  condition.  Estimated blood loss for this procedure was less than 25 mL.      Harvie Junior, M.D.  Electronically Signed     JLG/MEDQ   D:  08/20/2008  T:  08/21/2008  Job:  [id number]

## 2011-05-03 NOTE — H&P (Signed)
NAME:  Ryan Cobb, Ryan Cobb NO.:  [id number]   MEDICAL RECORD NO.:  [id number]          PATIENT TYPE:  OIB   LOCATION:  3022                         FACILITY:  [facility]   PHYSICIAN:  Payton Doughty, M.D.      DATE OF BIRTH:  07-Oct-1951   DATE OF ADMISSION:  04/27/2009  DATE OF DISCHARGE:                              HISTORY & PHYSICAL   ADMITTING DIAGNOSIS:  Herniated disk at C7-T1 on the left.   DICTATING DOCTOR:  Payton Doughty, MD, Neurosurgery.   BODY TEXT:  This is a 60 year old right-handed white gentleman, for some  weeks had pain in his left shoulder and neck.  It has been getting down  his arms, with tingling and dysesthesias of his left hand.  MR couple  ago shows a disk at C7-T1, he is now admitted for an anterior  decompression and fusion.   MEDICAL HISTORY:  Remarkable for hypertension.  He takes lisinopril,  hydrochlorothiazide, Lexapro, Singulair, aspirin, and Oracea.   ALLERGIES:  Has no allergies.   SURGICAL HISTORY:  There has been knee replacements, knee arthroscopies,  hernia, and appendectomy.   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   FAMILY HISTORY:  Both parents are deceased.  His father had cirrhosis.  Mother had pancreatitis and diabetes.   REVIEW OF SYSTEMS:  Remarkable for arm weakness, arm pain, and neck  pain.   PHYSICAL EXAMINATION:  HEENT:  Within normal limits.  NECK:  He has reasonable range of motion of neck with reproduction of  his left arm pain turning his head towards the left.  CHEST:  Clear.  CARDIOVASCULAR:  Regular rate and rhythm.  ABDOMEN:  Nontender with no hepatosplenomegaly.  EXTREMITIES:  Without clubbing, cyanosis.  Peripheral pulses are good.  GU:  Deferred.  NEUROLOGIC:  He is awake, alert, and oriented.  His cranial nerves were  intact.  Motor exam shows 5/5 strength throughout the right upper  extremity and left upper extremity.  He has weakness in the interossei  and the grip.  Reflexes are 2 at the biceps, 2 at the triceps, and 1 at  the brachial radialis.  Hoffmann is negative.  He is nonmyelopathic.   MRI shows large herniated disk at C7-T1 eccentric to the left,  compression of left C8 nerve root.   CLINICAL IMPRESSION:  Left C8 radiculopathy secondary to herniated disk  at C7-T1.   PLAN:  For an anterior decompression and fusion at C7-T1.  Foregoing  more conservative measures because of the weakness in his hand.  The  risks and benefits have been discussed with him and he wished to  proceed.           ______________________________  Payton Doughty, M.D.     MWR/MEDQ  D:  04/27/2009  T:  04/28/2009  Job:  [id number]

## 2011-05-03 NOTE — Op Note (Signed)
NAMECHALMER, ZHENG             ACCOUNT NO.:  [id number]   MEDICAL RECORD NO.:  [id number]          PATIENT TYPE:  OIB   LOCATION:  3022                         FACILITY:  [facility]   PHYSICIAN:  Payton Doughty, M.D.      DATE OF BIRTH:  August 26, 1951   DATE OF PROCEDURE:  04/27/2009  DATE OF DISCHARGE:                               OPERATIVE REPORT   PREOPERATIVE DIAGNOSES:  Left C8 radiculopathy and herniated disk at C7-  T1.   POSTOPERATIVE DIAGNOSES:  Left C8 radiculopathy and herniated disk at C7-  T1.   OPERATIVE PROCEDURE:  C7-T1 anterior cervical decompression and fusion  with a Reflex hybrid plate.   SURGEON:  Payton Doughty, MD   ANESTHESIA:  General endotracheal.   PREPARATION:  Prepped and draped with alcohol wipe.   COMPLICATION:  None.   ASSISTANT:  Bedelia Person, MD   A 60 year old with left C8 radiculopathy and herniated disk at C7-T1 on  the left.  Taken to operative room, smoothly anesthetized, intubated,  placed supine on the operating table.  Following shave, prep, and drape  in the usual sterile fashion, skin was incised from midline to the  medial border of sternocleidomastoid muscle on the left side  fingerbreadth above the clavicle.  The platysma was identified,  elevated, divided, and undermined.  Sternocleidomastoids identified.  Medial dissection revealed the carotid artery and jugular vein retracted  laterally to the left.  Trachea and esophagus retracted laterally to the  right, exposing the bones in the anterior cervical spine.  Marker was  placed.  An intraoperative x-ray obtained to confirm correct level and  confirmed correct level.  Diskectomy was carried out under gross  observation.  We then brought in the operating microscope and we used  microdissection technique to remove the remaining disks, divided the  posterior annulus, explored the neural foramina.  On the left side,  there was a large fragment of disk that extended out into the neural  foramen.  This was carefully dissected free and removed without  difficulty.  The nerve root had a good appearance.  Following complete  decompression and division of the posterior longitudinal ligament, an 8-  mm bone graft tapped into place.  A 14-mm Reflex hybrid plate was placed  using 12-mm screws, 2 in C7 and 2 in T1.  X-ray was not obtained because  it was obvious from the prior films that I was not going to be able to  visualize C7-T1 interspace.  Successive layers of 3-0  Vicryl and 4-0 Vicryl were used to close the platysma, subcutaneous  tissues, and skin.  Benzoin and Steri-Strips were placed, made occlusive  with Telfa and OpSite, and the patient returned to recovery room in good  condition.   .           ______________________________  Payton Doughty, M.D.     MWR/MEDQ  D:  04/27/2009  T:  04/28/2009  Job:  [id number]

## 2011-05-06 NOTE — Discharge Summary (Signed)
Ryan Cobb, Ryan Cobb             ACCOUNT NO.:  [id number]   MEDICAL RECORD NO.:  [id number]          PATIENT TYPE:  INP   LOCATION:  5035                         FACILITY:  [facility]   PHYSICIAN:  Harvie Junior, M.D.   DATE OF BIRTH:  09-28-1951   DATE OF ADMISSION:  08/20/2008  DATE OF DISCHARGE:  08/23/2008                               DISCHARGE SUMMARY   ADMITTING DIAGNOSES:  1. End-stage degenerative joint disease, left knee.  2. Hypertension.  3. Seasonal allergies.   DISCHARGE DIAGNOSES:  1. End-stage degenerative joint disease, left knee.  2. Hypertension.  3. Seasonal allergies.   PROCEDURES IN THE HOSPITAL:  Left total knee arthroplasty, computer-  assisted, by Jodi Geralds, MD, on August 20, 2008.   BRIEF HISTORY:  Ryan Cobb is a 60 year old male who has a long history of  pain in his left knee associated with ambulation.  Standing x-rays of  the left knee shows he has bone-on-bone degenerative arthritis.  He has  night pain and pain with ambulation and got only temporary relief with  conservative treatment including use of anti-inflammatories,  modification of activity, and cortisone injection therapy.  Based upon  his clinical and radiographic findings, he is felt to be a candidate for  a left total knee replacement and is admitted for this.   PERTINENT LABORATORY STUDIES:  The patient's hemoglobin on admission was  15.1, hematocrit 43.7, WBC 8.4.  On postop day #1, hemoglobin was 10.9,  on postop day #2, is 10.9, on postop day #3, 10.6.  Indices were within  normal limits.  Pro time on admission was 13.1 seconds and INR of 1.0  and a PTT of 34.  On the day of discharge, his pro time was 44.9 seconds  and INR of 4.3.  CMET on admission showed no abnormalities other than  slightly elevated glucose of 135.  His eGFR was normal.  Urinalysis  showed no abnormalities.   HOSPITAL COURSE:  The patient underwent left total knee arthroplasty, as  well described in Dr.  Luiz Blare' operative note.  Preoperatively, he was  given 80 mg IV of gentamicin and 2 g IV of Ancef.  Postoperatively, he  was given a gram of Ancef q.8 h. x3 doses prophylactically.  PCA  morphine pump was used for pain control.  Physical Therapy ordered for  walker and ambulation, weightbearing as tolerated on the left.  On  postoperative day #1, he had no complaints other than moderate knee  pain.  His hemoglobin was 10.9.  BMET was normal.  His INR was 1.3.  His  left leg bandage was somewhat bloody and this was reinforced.  He was  put on Coumadin anticoagulation for DVT prophylaxis.  On postoperative  day #2, he complained of knee soreness.  He was taking fluids and  voiding without difficulty.  His Foley catheter that was placed at the  time of surgery was removed on postoperative day #1.  His left knee  dressing was clean and dry at this point, and his dressing was changed  and the Hemovac was pulled.  His INR was 2.7.  His  hemoglobin was 10.9.  His PCA morphine pump discontinued.  His IV was converted to a saline  lock.  He continued to make good progress with physical therapy and was  then discharged home on August 23, 2008, on postoperative day #3.  The  patient was passing gas without difficulty and has not had a bowel  movement.  He was making progress with physical therapy.  His left knee  incision was clean and dry.  Calf was soft and he was intact distally.  The patient was in improved condition and was on a regular diet.  He was  given Rx Percocet 5 mg preop for pain and Coumadin times 1 month postop  for DVT prophylaxis.  He will need home health physical therapy and  home health RN for pro times and Coumadin management.  He will use a  home CPM as well.  Activity status, he will weight bear as tolerated on  the left with a walker.  He will follow up with Dr. Luiz Blare in the office  in about 10 days or sooner if any problems occur.      Marshia Ly, P.A.       Harvie Junior, M.D.  Electronically Signed    JB/MEDQ  D:  10/15/2008  T:  10/16/2008  Job:  [id number]   cc:   Tally Joe, M.D.

## 2011-05-06 NOTE — Op Note (Signed)
[facility]  Patient:    Ryan Cobb, Ryan Cobb                      MRN: [id number] Proc. Date: 07/11/01 Attending:  Thornton Park. Daphine Deutscher, M.D. [facility]  Elana Alm. Eliezer Lofts., M.D.   Operative Report  PREOPERATIVE DIAGNOSIS:  Left inguinal hernia.  POSTOPERATIVE DIAGNOSIS:  Left indirect inguinal hernia.  OPERATION/PROCEDURE:  Left indirect herniorrhaphy with mesh.  SURGEON:  Thornton Park. Daphine Deutscher, M.D.  ANESTHESIA:  Local, MAC.  DESCRIPTION OF PROCEDURE:  Mr. Haynes Bast was taken to Room #1 at [facility] on the afternoon of 7/24.  The abdomen was prepped with Betadine and draped sterilely.  I did a regional block with a mixture of Marcaine, lidocaine and bicarb.  This was done over the anterior superior iliac spine and then a small wound was injected and a small oblique incision made.  This was carried down to the external oblique which was identified, opened and cord was mobilized. The ilioinguinal nerve branch was separated from the cord and was preserved with the inferior flap.  We mobilized the cord and found a large indirect sac proximally along with the large lipoma on the cord.  I opened the sac, put my finger inside, felt the floor, felt the structures and then separated this from the rest of the cord structures and twisted it off and removed the remaining excess sac and oversewed the base with a figure-of-eight suture of 2-0 silk.  The sac remnant retracted up into the abdomen.  The large lipoma of the cord was removed and again the base was oversewn with 2-0 silk.  A piece of Marlex mesh was cut to fit and sutured to the inguinal ligament with a running 2-0 Prolene.  This was split and brought around the cord and sutured to itself again with a single horizontal mattress suture of 2-0 Prolene.  It was sutured medially with running 2-0 Prolene.  This was tucked beneath the external oblique and closed with a running 2-0 Vicryl.  A  4-0 Vicryl was used in the subcutaneous tissue and subcuticular with Benzoin and Steri-Strips in the skin.  The patient seemed to tolerate the procedure well and was taken to the recovery room in satisfactory condition.  He will be given Tylox to take for pain and he will be followed up in the office in about two to three weeks.DD:  07/11/01 TD:  07/12/01 Job: [id number]

## 2011-05-06 NOTE — Discharge Summary (Signed)
Ryan Cobb, Ryan Cobb             ACCOUNT NO.:  [id number]   MEDICAL RECORD NO.:  [id number]          PATIENT TYPE:  INP   LOCATION:  5031                         FACILITY:  [facility]   PHYSICIAN:  Ryan Cobb, M.D.   DATE OF BIRTH:  17-Jan-1951   DATE OF ADMISSION:  08/24/2008  DATE OF DISCHARGE:  08/26/2008                               DISCHARGE SUMMARY   ADMITTING DIAGNOSES:  1. Status post left total knee arthroplasty on August 20, 2008, with      pain and swelling.  2. Possible infection, left knee, status post total knee replacement.  3. Hemarthrosis, left knee, status post total knee replacement.  4. Elevated INR, on Coumadin therapy for deep vein thrombosis      prophylaxis.   DISCHARGE DIAGNOSES:  1. Status post left total knee arthroplasty on August 20, 2008, with      pain and swelling.  2. Tense hemarthrosis, left knee, status post total knee replacement      on August 20, 2008.  3. Elevated INR, on Coumadin therapy for deep vein thrombosis      prophylaxis.   PROCEDURE IN HOSPITAL:  None.   ADMITTING PHYSICIAN:  Ryan Severe, MD/Ryan Starling Manns, MD   BRIEF HISTORY:  [name] is a 60 year old male who on the date of  this admission was 4 days status post left total knee replacement.  He  was discharged from the hospital the day prior and had an INR of 4.3 on  Coumadin therapy.  His Coumadin was held.  He had increased left knee  pain and swelling in the past 24 hours.  He presented to the emergency  room with a swollen left knee with ecchymosis and induration along the  medial thigh approximately and a fairly significant effusion.  He had no  neurologic deficit distally and had good range of motion of his ankle  and limited motion of his knee secondary to the swelling but had distal  pulses intact.  The patient was felt need to be admitted to the  hospital, was held with physical therapy, and was instructed doing quad  sets.  Following morning, blood  cultures were obtained, physical therapy  was held, and Coumadin was managed per pharmacy.  The INR came down to  2.1 on August 25, 2008.  Dr. Luiz Cobb had evaluated the patient.  He had  little less pain and swelling.  He had an INR of 2.8 on August 26, 2008.  His vital signs were stable.  He was afebrile.  He had some  effusion and erythema of the medial side and anterior shin area.  He had  tenderness in the calf with a plus or minus Homans sign.  He was sent  for a venous Doppler which was negative for DVT.  He was given a couple  grams of Ancef IV and his knee was aspirated of blood.  This was sent to  the laboratory where cultures were negative.  The patient was then  discharged home in improved condition.  He will continue to work on  range of motion, icing, and elevation.  He was discharged home on Keflex  500 mg p.o. q.i.d. # 30 and Percocet 5 mg p.r.n. for pain.  He will  continue with anticoagulation per home health and home health physical  therapy will resume for knee range of motion.   LABORATORY DATA:  On admission, the patient had a venous Doppler on  August 26, 2008, which showed no evidence of DVT, superficial  thrombosis, or Baker cyst in left lower extremity.  The patient had a  hemoglobin of 10.4 on admission with WBC of 11.2.  Later the same day,  hemoglobin was 9.9.  On August 25, 2008, hemoglobin was 9.7.  On  August 26, 2008, hemoglobin was 10.1 with a WBC of 10.5.  Sed rate was  81 mm per hour.  Protime on admission was 24.8 seconds with an INR of  2.1.  On August 26, 2008, the INR was 2.8.  BMET  showed no abnormalities.  No abnormalities of CMET as well with normal  liver studies.  Blood cultures showed no growth in 5 days and gram stain  of the left knee showed no organisms and no growth in 3 days.  The  patient will follow up with Dr. Luiz Cobb in the office in about 10 days or  sooner if any problems occur.      Ryan Cobb, P.A.       Ryan Cobb, M.D.  Electronically Signed    JB/MEDQ  D:  10/15/2008  T:  10/16/2008  Job:  [id number]

## 2011-05-06 NOTE — Op Note (Signed)
NAME:  Ryan Cobb, Ryan Cobb             ACCOUNT NO.:  [id number]   MEDICAL RECORD NO.:  [id number]          PATIENT TYPE:  AMB   LOCATION:  DSC                          FACILITY:  [facility]   PHYSICIAN:  Harvie Junior, M.D.   DATE OF BIRTH:  01/25/1951   DATE OF PROCEDURE:  09/23/2005  DATE OF DISCHARGE:                                 OPERATIVE REPORT   PREOPERATIVE DIAGNOSIS:  Medial meniscal tear with suspected patellofemoral  problems.   POSTOPERATIVE DIAGNOSES:  1.  Medial meniscal tear.  2.  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   PROCEDURE:  1.  Partial posterior horn medial meniscal tear with corresponding      debridement of medial femoral condyle.  2.  Debridement of patellofemoral trochlea and under-surface of patella.   SURGEON:  Harvie Junior, M.D.   ASSISTANT:  Marshia Ly, P.A.   ANESTHESIA:  General.   INDICATIONS FOR PROCEDURE:  Mr. Bougie is a 60 year old male with a long  history of having had open meniscectomy on the left knee.  He has done  reasonably well with that.  The right knee began hurting him.  He was  thought to have significant pain over the medial joint line and suspected  medial meniscal tear.  We talked about treatment options including MRI,  versus arthroscopy.  Ultimately he elected to have an arthroscopy.  He is  brought to the operating room for this procedure.   DESCRIPTION OF PROCEDURE:  The patient was brought to the operating room.  After adequate anesthesia was obtained with general anesthesia, the patient  was placed on the operating room table.  The right leg was prepped and  draped in the usual sterile fashion.  Following this the arthroscopic  examination of the knee revealed that there was an obvious posterior horn  medial meniscal tear which was debrided back to a smooth and stable rim with  straight-biting forceps on the right-hand side and biting forceps and up-  biting forceps.  The remainder  of the meniscal rim was taken down with a  suction shaver.   Attention was turned to the medial femoral condyle where there were some  grade 2 and grade 3 changes.  This was debrided to a smooth and stable rim.  Attention was turned to the ACL which was normal.  Lateral side was normal.  Attention was turned up into the patellofemoral joint.  There were some  grade 2 changes in the trochlea and some fissuring of the patellar  cartilage.  The trochlea was debrided.  The tracking of the patella was  midline.  No significant tightness under the patella.  At this point the  knee was copiously irrigated and suctioned dry.  A final  check was made for any loose fragment pieces in the meniscus.  Seeing none,  a sterile compressive dressing was applied.   The patient was taken to the recovery room and noted to be in satisfactory  condition.      Harvie Junior, M.D.  Electronically Signed     JLG/MEDQ  D:  09/23/2005  T:  09/23/2005  Job:  [id number]

## 2011-08-11 ENCOUNTER — Other Ambulatory Visit: Payer: Self-pay | Admitting: Internal Medicine

## 2011-09-21 LAB — PROTIME-INR
INR: 2.8 — ABNORMAL HIGH
Prothrombin Time: 26.9 — ABNORMAL HIGH
Prothrombin Time: 31.1 — ABNORMAL HIGH
Prothrombin Time: 44.9 — ABNORMAL HIGH

## 2011-09-21 LAB — BASIC METABOLIC PANEL
CO2: 31
Glucose, Bld: 135 — ABNORMAL HIGH
Potassium: 3.6
Sodium: 139

## 2011-09-21 LAB — CBC
HCT: 31.9 — ABNORMAL LOW
HCT: 32.3 — ABNORMAL LOW
Hemoglobin: 10.4 — ABNORMAL LOW
Hemoglobin: 10.9 — ABNORMAL LOW
MCHC: 34
MCHC: 34.2
MCHC: 34.3
MCHC: 34.7
MCHC: 34.8
MCV: 90.6
MCV: 90.6
MCV: 90.8
MCV: 91.2
MCV: 92.2
Platelets: 183
Platelets: 192
Platelets: 236
RBC: 3.39 — ABNORMAL LOW
RDW: 12.9
RDW: 12.9
RDW: 13.1
RDW: 13.3
RDW: 13.4

## 2011-09-21 LAB — COMPREHENSIVE METABOLIC PANEL
ALT: 34
AST: 35
CO2: 29
Chloride: 103
GFR calc Af Amer: >60
GFR calc non Af Amer: >60
Sodium: 139
Total Bilirubin: 1.2

## 2011-09-21 LAB — POCT I-STAT, CHEM 8
BUN: 14
Hemoglobin: 9.9 — ABNORMAL LOW
Potassium: 4.4
Sodium: 139
TCO2: 29

## 2011-09-21 LAB — GRAM STAIN

## 2011-09-21 LAB — DIFFERENTIAL
Basophils Absolute: 0
Lymphocytes Relative: 16
Lymphs Abs: 1.8
Monocytes Absolute: 1
Neutro Abs: 8.2 — ABNORMAL HIGH

## 2011-09-21 LAB — CULTURE, BLOOD (ROUTINE X 2): Culture: NO GROWTH

## 2011-09-21 LAB — APTT: aPTT: 64 — ABNORMAL HIGH

## 2011-09-21 LAB — BODY FLUID CULTURE: Culture: NO GROWTH

## 2011-10-03 LAB — COMPREHENSIVE METABOLIC PANEL
ALT: 28
AST: 27
Albumin: 3.7
CO2: 24
Calcium: 8.8
GFR calc Af Amer: >60
GFR calc non Af Amer: >60
Sodium: 137
Total Protein: 6.8

## 2011-10-03 LAB — DIFFERENTIAL
Basophils Relative: 0
Eosinophils Relative: 0
Lymphs Abs: 0.8
Monocytes Absolute: 0.3
Neutro Abs: 15.8 — ABNORMAL HIGH

## 2011-10-03 LAB — CBC
MCHC: 35.2
RBC: 5.05
WBC: 16.9 — ABNORMAL HIGH

## 2011-10-26 ENCOUNTER — Encounter: Payer: Self-pay | Admitting: Internal Medicine

## 2011-10-27 ENCOUNTER — Ambulatory Visit (INDEPENDENT_AMBULATORY_CARE_PROVIDER_SITE_OTHER): Payer: PRIVATE HEALTH INSURANCE | Admitting: Internal Medicine

## 2011-10-27 ENCOUNTER — Encounter: Payer: Self-pay | Admitting: Internal Medicine

## 2011-10-27 VITALS — BP 116/70 | HR 58 | Temp 98.3°F | Resp 12 | Ht 65.75 in | Wt 165.0 lb

## 2011-10-27 DIAGNOSIS — E785 Hyperlipidemia, unspecified: Secondary | ICD-10-CM

## 2011-10-27 DIAGNOSIS — I10 Essential (primary) hypertension: Secondary | ICD-10-CM

## 2011-10-27 DIAGNOSIS — Z0000 Encounter for general adult medical examination without abnormal findings: Secondary | ICD-10-CM

## 2011-10-27 LAB — LIPID PANEL
Cholesterol: 159 mg/dL (ref 0–200)
HDL: 51 mg/dL (ref >39.00)
VLDL: 13.4 mg/dL (ref 0.0–40.0)

## 2011-10-27 LAB — BASIC METABOLIC PANEL
BUN: 20 mg/dL (ref 6–23)
Calcium: 9.2 mg/dL (ref 8.4–10.5)
Creatinine, Ser: 1 mg/dL (ref 0.4–1.5)
GFR: 79.6 mL/min (ref >60.00)
Glucose, Bld: 98 mg/dL (ref 70–99)

## 2011-10-27 LAB — HEPATIC FUNCTION PANEL
ALT: 24 U/L (ref 0–53)
AST: 28 U/L (ref 0–37)
Albumin: 4.3 g/dL (ref 3.5–5.2)
Alkaline Phosphatase: 53 U/L (ref 39–117)
Total Bilirubin: 0.8 mg/dL (ref 0.3–1.2)

## 2011-10-27 LAB — CBC WITH DIFFERENTIAL/PLATELET
Basophils Absolute: 0.1 10*3/uL (ref 0.0–0.1)
Eosinophils Absolute: 0 10*3/uL (ref 0.0–0.7)
Lymphocytes Relative: 18.1 % (ref 12.0–46.0)
MCHC: 33.7 g/dL (ref 30.0–36.0)
Monocytes Relative: 3.6 % (ref 3.0–12.0)
Platelets: 240 10*3/uL (ref 150.0–400.0)
RDW: 13.4 % (ref 11.5–14.6)

## 2011-10-27 NOTE — Progress Notes (Signed)
Subjective:    Patient ID: Ryan Cobb, male    DOB: [birth year], 60 y.o.   MRN: [id number]  HPI  Mr. Broadus  is here for a physical; he has no acute issues except having been rated based on elevated calcium level on  labs done for insurance coverage .   Review of Systems At the time of his cervical surgery in 2009; he was told that his bones were "soft". Since that time he has been taking calcium 600 mg daily and vitamin D 1000 mg daily. Mr. June his calcium was 12.3 on the insurance exam. Review of prior labs reveals a calcium of 9.3 and 04/2009 and 10.3 in 06/2010, both within normal limits.  He denies abdominal pain, dyspepsia, significant pain or palpitations.     Objective:   Physical Exam Gen.: Healthy and well-nourished in appearance. Alert, appropriate and cooperative throughout exam. Head: Normocephalic without obvious abnormalities  Eyes: No corneal or conjunctival inflammation noted. Pupils equal round reactive to light and accommodation. Fundal exam difficult to visualize. Extraocular motion intact. Vision grossly normal. Ears: External  ear exam reveals no significant lesions or deformities. Canals clear .TMs normal. Hearing is grossly decreased  Bilaterally to whisper @ 6 ft. Nose: External nasal exam reveals no deformity or inflammation. Nasal mucosa are pink and moist. No lesions or exudates noted.  Mouth: Oral mucosa and oropharynx reveal no lesions or exudates. Teeth in good repair. Neck: No deformities, masses, or tenderness noted. Range of motion &  Thyroid normal. Lungs: Normal respiratory effort; chest expands symmetrically. Lungs are clear to auscultation without rales, wheezes, or increased work of breathing. Heart: Slow rate and regular rhythm. Normal S1 and S2. No gallop, click, or rub. S 4 with slight slurring; no  murmur. Abdomen: Bowel sounds normal; abdomen soft and nontender. No masses, organomegaly or hernias noted. Genitalia /DRE:  Genital exam is  unremarkable. Prostate is normal without induration, and enlargement , asymmetry or nodules.  .                                                                                   Musculoskeletal/extremities: No deformity or scoliosis noted of  the thoracic or lumbar spine. No clubbing, cyanosis, edema, or deformity noted. Range of motion  normal .Tone & strength  normal.Joints normal. Nail health  good. Vascular: Carotid, radial artery, dorsalis pedis and  posterior tibial pulses are full and equal. No bruits present. Neurologic: Alert and oriented x3. Deep tendon reflexes symmetrical and normal.          Skin: Intact without suspicious lesions or rashes. Lymph: No cervical, axillary, or inguinal lymphadenopathy present. Psych: Mood and affect are normal. Normally interactive  Assessment & Plan:  #1 comprehensive physical exam; no acute findings #2 see Problem List with Assessments & Recommendations  #3 hypercalcemia; this is most likely related to calcium and vitamin D supplementation. Clinically and historically there is no evidence of hyperparathyroidism. Plan: see Orders   EKG reveals sinus bradycardia. There is slight "sagging"  depression of the ST-T segment in leads 2, 3, and aVF. No ischemic changes are noted. This may be related to hypertension or possibly to the hypercalcemia as noted above. Comparison with old EKGs is recommended.

## 2011-10-27 NOTE — Patient Instructions (Signed)
Preventive Health Care: Exercise at least 30-45 minutes a day,  3-4 days a week.  Eat a low-fat diet with lots of fruits and vegetables, up to 7-9 servings per day. Consume less than 40 grams of sugar per day from foods & drinks with High Fructose Corn Sugar as # 1,2,3 or # 4 on label.  

## 2011-10-28 LAB — VITAMIN D 25 HYDROXY (VIT D DEFICIENCY, FRACTURES): Vit D, 25-Hydroxy: 79 ng/mL (ref 30–89)

## 2011-11-14 ENCOUNTER — Other Ambulatory Visit: Payer: Self-pay | Admitting: Internal Medicine

## 2011-11-14 MED ORDER — MONTELUKAST SODIUM 10 MG PO TABS: 10.0000 mg | ORAL_TABLET | Freq: Every day | ORAL | Status: DC

## 2011-11-14 NOTE — Telephone Encounter (Signed)
RX called in, would not go through electronically  

## 2011-11-14 NOTE — Telephone Encounter (Signed)
Rx sent 

## 2012-01-24 ENCOUNTER — Encounter: Payer: Self-pay | Admitting: Internal Medicine

## 2012-01-24 ENCOUNTER — Ambulatory Visit (INDEPENDENT_AMBULATORY_CARE_PROVIDER_SITE_OTHER): Payer: PRIVATE HEALTH INSURANCE | Admitting: Internal Medicine

## 2012-01-24 ENCOUNTER — Telehealth: Payer: Self-pay | Admitting: Internal Medicine

## 2012-01-24 VITALS — BP 118/80 | HR 61 | Temp 98.1°F | Wt 175.8 lb

## 2012-01-24 DIAGNOSIS — A499 Bacterial infection, unspecified: Secondary | ICD-10-CM

## 2012-01-24 DIAGNOSIS — J329 Chronic sinusitis, unspecified: Secondary | ICD-10-CM

## 2012-01-24 DIAGNOSIS — B9689 Other specified bacterial agents as the cause of diseases classified elsewhere: Secondary | ICD-10-CM

## 2012-01-24 MED ORDER — FLUTICASONE PROPIONATE 50 MCG/ACT NA SUSP: 1.0000 | Freq: Two times a day (BID) | NASAL | Status: DC

## 2012-01-24 MED ORDER — CEFUROXIME AXETIL 500 MG PO TABS: 500.0000 mg | ORAL_TABLET | Freq: Two times a day (BID) | ORAL | Status: AC

## 2012-01-24 NOTE — Progress Notes (Signed)
  Subjective:    Patient ID: Ryan Cobb, male    DOB: February 11, 1951, 61 y.o.   MRN: [id number]  HPI Respiratory tract infection Onset/symptoms:3 weeks ago as ST & sniffles Exposures (illness/environmental/extrinsic):grandchildren ill Progression of symptoms:to head congestion Treatments/response:Neti pot twice a day, Mucinex with partial benefit; NSAIDS  Present symptoms: Fever/chills/sweats:no Frontal headache:yes Facial pain:yes Nasal purulence:green Sore throat:in am Dental pain:no Lymphadenopathy:no Wheezing/shortness of breath:slight DOE Cough/sputum/hemoptysis:scant green sputum Associated extrinsic/allergic symptoms:itchy eyes/ sneezing:no Smoking history;quit 1981  He takes amoxicillin pre-dental work because of a total knee replacement, he has no history of valvular heart           Review of Systems     Objective:   Physical Exam General appearance:good health ;well nourished; no acute distress or increased work of breathing is present.  No  lymphadenopathy about the head, neck, or axilla noted.   Eyes: No conjunctival inflammation or lid edema is present. .  Ears:  External ear exam shows no significant lesions or deformities.  Otoscopic examination reveals clear canals, tympanic membranes are intact bilaterally without bulging, retraction, inflammation or discharge.  Nose:  External nasal examination shows no deformity or inflammation. Nasal mucosa are dry without lesions or exudates. No septal dislocation or deviation.No obstruction to airflow.   Oral exam: Dental hygiene is good; lips and gums are healthy appearing.There is mild  oropharyngeal erythema , but no  exudate noted.      Heart:  Normal rate and regular rhythm. S1 accentuated; no gallop, murmur, click, rub or other extra sounds.   Lungs:Chest clear to auscultation; no wheezes, rhonchi,rales ,or rubs present.No increased work of breathing.    Extremities:  No cyanosis, edema, or clubbing   noted    Skin: Warm & dry           Assessment & Plan:  #1 rhinosinusitis, acute.  Plan: Amoxicillin should be avoided as he uses this for SBE prophylaxis and there  could be increased risk of resistant organisms

## 2012-01-24 NOTE — Patient Instructions (Signed)
Plain Mucinex for thick secretions ;force NON dairy fluids . Use a Neti pot daily as needed for sinus congestion .Flonase 1 spray in each nostril twice a day as needed. Use the "crossover" technique as discussed   

## 2012-01-24 NOTE — Telephone Encounter (Signed)
[facility] Triage Call Report Triage Record Num: [id number] Operator: Baldomero Lamy Patient Name: Ryan Cobb Call Date & Time: 01/24/2012 9:10:02AM Patient Phone: [phone] PCP: Marga Melnick Patient Gender: Male PCP Fax : [phone] Patient DOB: Dec 27, 1950 Practice Name: [facility] Reason for Call: Caller: Kaleem/Patient; PCP: Marga Melnick; CB#: [phone]; ; ; Call regarding Cough/Congestion; Pt calling regarding URI sxs for 3 weeks now. Pt c/o yellow/green mucus and "just not getting better". Afebrile. Using Robitussin x 2 bottles-no relief. Emergent sxs of Upper Respiratory Infection r/o. Disp: 24 hrs. Made pt appt in [facility] with Dr. Alwyn Ren at 1530 on 2/5. Relayed to pt. Protocol(s) Used: Upper Respiratory Infection (URI) Recommended Outcome per Protocol: See Provider within 24 hours Reason for Outcome: Productive cough with colored sputum (other than clear or white sputum) Care Advice: ~ Use a cool mist humidifier to moisten air. Be sure to clean according to manufacturer's instructions. ~ May inhale steam from hot shower or heated water. Be careful to avoid burns. Limit or avoid exposure to irritants and allergens (e.g. air pollution, smoke/smoking, chemicals, dust, pollen, pet dander, etc.) ~ ~ SYMPTOM / CONDITION MANAGEMENT ~ CAUTIONS Analgesic/Antipyretic Advice - Acetaminophen: Consider acetaminophen as directed on label or by pharmacist/provider for pain or fever PRECAUTIONS: - Use if there is no history of liver disease, alcoholism, or intake of three or more alcohol drinks per day - Only if approved by provider during pregnancy or when breastfeeding - During pregnancy, acetaminophen should not be taken more than 3 consecutive days without telling provider - Do not exceed recommended dose or frequency ~ ~ Go to the ED if having chest pain with breathing or breathing is becoming more difficult. Call provider if has a fever over 101.5  F (38.6 C) that has not responded to home care measures, having shaking chills or any fever in someone immunocompromised/frail elderly. ~ Analgesic/Antipyretic Advice - NSAIDs: Consider aspirin, ibuprofen, naproxen or ketoprofen for pain or fever as directed on label or by pharmacist/provider. PRECAUTIONS: - If over 53 years of age, should not take longer than 1 week without consulting provider. EXCEPTIONS: - Should not be used if taking blood thinners or have bleeding problems. - Do not use if have history of sensitivity/allergy to any of these medications; or history of cardiovascular, ulcer, kidney, liver disease or diabetes unless approved by provider. - Do not exceed recommended dose or frequency. ~ 02/

## 2012-02-01 ENCOUNTER — Telehealth: Payer: Self-pay | Admitting: Internal Medicine

## 2012-02-01 NOTE — Telephone Encounter (Signed)
Spoke with patient, patient would like to update his Tetanus vaccine prior to go to [place] in March of 2013. Appointment schedule for Friday to receive TDaP for patient is not sure if he had TD or TDaP in 2003

## 2012-02-01 NOTE — Telephone Encounter (Signed)
The pt called and is hoping to get advice on if he needs a Tetanus vaccine before going to [place] in March 2013.  His last tetanus was 07/2002.  Please advise.   pts number - [phone]  Thanks!

## 2012-02-03 ENCOUNTER — Ambulatory Visit (INDEPENDENT_AMBULATORY_CARE_PROVIDER_SITE_OTHER): Payer: PRIVATE HEALTH INSURANCE | Admitting: *Deleted

## 2012-02-03 DIAGNOSIS — Z23 Encounter for immunization: Secondary | ICD-10-CM

## 2012-02-09 ENCOUNTER — Other Ambulatory Visit: Payer: Self-pay | Admitting: Internal Medicine

## 2012-08-15 ENCOUNTER — Other Ambulatory Visit: Payer: Self-pay | Admitting: Internal Medicine

## 2012-08-15 MED ORDER — LISINOPRIL-HYDROCHLOROTHIAZIDE 20-12.5 MG PO TABS: ORAL_TABLET | ORAL | Status: DC

## 2012-08-15 NOTE — Telephone Encounter (Signed)
Refill Lisinopril-Hydrochlorothiazide (Tab) PRINZIDE,ZESTORETIC 20-12.5 MG TAKE 1 TABLET ONCE DAILY # 31-last fill 07.28.13 Last ov acute 2.513

## 2012-08-15 NOTE — Telephone Encounter (Signed)
RX sent

## 2012-09-26 ENCOUNTER — Ambulatory Visit (INDEPENDENT_AMBULATORY_CARE_PROVIDER_SITE_OTHER): Payer: BC Managed Care – PPO | Admitting: Internal Medicine

## 2012-09-26 ENCOUNTER — Encounter: Payer: Self-pay | Admitting: Internal Medicine

## 2012-09-26 VITALS — BP 110/66 | HR 67 | Temp 98.3°F | Wt 176.8 lb

## 2012-09-26 DIAGNOSIS — A499 Bacterial infection, unspecified: Secondary | ICD-10-CM

## 2012-09-26 DIAGNOSIS — J069 Acute upper respiratory infection, unspecified: Secondary | ICD-10-CM

## 2012-09-26 DIAGNOSIS — J209 Acute bronchitis, unspecified: Secondary | ICD-10-CM

## 2012-09-26 DIAGNOSIS — B9689 Other specified bacterial agents as the cause of diseases classified elsewhere: Secondary | ICD-10-CM

## 2012-09-26 DIAGNOSIS — J329 Chronic sinusitis, unspecified: Secondary | ICD-10-CM

## 2012-09-26 MED ORDER — FLUTICASONE PROPIONATE 50 MCG/ACT NA SUSP: 1.0000 | Freq: Two times a day (BID) | NASAL | Status: DC

## 2012-09-26 MED ORDER — CEFUROXIME AXETIL 500 MG PO TABS: 500.0000 mg | ORAL_TABLET | Freq: Two times a day (BID) | ORAL | Status: DC

## 2012-09-26 NOTE — Progress Notes (Signed)
  Subjective:    Patient ID: Ryan Cobb, male    DOB: July 25, 1951, 61 y.o.   MRN: [id number]  HPI   Symptoms began approximately 7 days ago as postnasal drainage and scratchy throat. He also experienced some itchy, watery eyes as well as sneezing.  He's had progressive nasal congestion despite using nasal lavage twice a day. He has now developed burning pain in the maxillary sinus area. Nasal discharge has been clear, but he's developed cough with some yellow sputum. He's also noticed some exertional dyspnea. He has had chills but no fever or sweats.    Review of Systems  He denies frontal headache, nasal purulence, otic pain, dental pain, or otic discharge.     Objective:   Physical Exam  General appearance:good health ;well nourished; no acute distress or increased work of breathing is present.  No  lymphadenopathy about the head, neck, or axilla noted. ? Cervical spur on L  Eyes: No conjunctival inflammation or lid edema is present.   Ears:  External ear exam shows no significant lesions or deformities.  Otoscopic examination reveals clear canals, tympanic membranes are intact bilaterally without bulging, retraction, inflammation or discharge.  Nose:  External nasal examination shows no deformity or inflammation. Nasal mucosa are dry & erythematous  without lesions or exudates. No septal dislocation or deviation.No obstruction to airflow.   Oral exam: Dental hygiene is good; lips and gums are healthy appearing.There is no oropharyngeal erythema or exudate noted.   Neck:  No deformities,  masses, or tenderness noted.    Heart:  Normal rate and regular rhythm. S1 and S2 normal without gallop, click, rub or other extra sounds. Faint R base murmur   Lungs:Chest clear to auscultation; no wheezes, rhonchi,rales ,or rubs present.No increased work of breathing.    Extremities:  No cyanosis, edema, or clubbing  noted    Skin: Warm & dry           Assessment & Plan:  #1  rhinosinusitis & bronchitis  Plan: Nasal hygiene interventions discussed. See prescription medications

## 2012-09-26 NOTE — Patient Instructions (Addendum)
Plain Mucinex for thick secretions ;force NON dairy fluids . Use a Neti pot only  as needed for sinus congestion; going from open side to congested side . Nasal cleansing in the shower as discussed. Make sure that all residual soap is removed to prevent irritation. Fluticasone 1 spray in each nostril twice a day as needed. Use the "crossover" technique as discussed. Plain Allegra 160 daily as needed for itchy eyes & sneezing.

## 2012-10-18 ENCOUNTER — Ambulatory Visit (INDEPENDENT_AMBULATORY_CARE_PROVIDER_SITE_OTHER): Payer: BC Managed Care – PPO

## 2012-10-18 DIAGNOSIS — Z23 Encounter for immunization: Secondary | ICD-10-CM

## 2012-10-18 DIAGNOSIS — Z299 Encounter for prophylactic measures, unspecified: Secondary | ICD-10-CM

## 2012-11-11 ENCOUNTER — Other Ambulatory Visit: Payer: Self-pay | Admitting: Internal Medicine

## 2013-01-01 ENCOUNTER — Ambulatory Visit (INDEPENDENT_AMBULATORY_CARE_PROVIDER_SITE_OTHER): Payer: BC Managed Care – PPO | Admitting: Internal Medicine

## 2013-01-01 ENCOUNTER — Encounter: Payer: Self-pay | Admitting: Internal Medicine

## 2013-01-01 VITALS — BP 130/86 | HR 52 | Temp 98.0°F | Resp 12 | Ht 65.08 in | Wt 176.0 lb

## 2013-01-01 DIAGNOSIS — Z0000 Encounter for general adult medical examination without abnormal findings: Secondary | ICD-10-CM

## 2013-01-01 DIAGNOSIS — R9431 Abnormal electrocardiogram [ECG] [EKG]: Secondary | ICD-10-CM | POA: Insufficient documentation

## 2013-01-01 NOTE — Progress Notes (Signed)
  Subjective:    Patient ID: Ryan Cobb, male    DOB: Apr 30, 1951, 62 y.o.   MRN: [id number]  HPI  Mr Burdin is here for a physical;acute issues include rotator cuff for which he is seeing Dr Luiz Blare      Review of Systems He is on modified heart healthy diet; he exercises 25-30 minutes 3-4 times per week without symptoms. Specifically he denies chest pain, palpitations, dyspnea, or claudication. Family history is negative for premature coronary disease. Advanced cholesterol testing reveals his LDL goal was less than 100.     Objective:   Physical Exam Gen.: Healthy and well-nourished in appearance. Alert, appropriate and cooperative throughout exam. Appears younger than stated age  Head: Normocephalic without obvious abnormalities  Eyes: No corneal or conjunctival inflammation noted. Pupils equal round reactive to light and accommodation.  Extraocular motion intact. Mono vision grossly normal. Ears: External  ear exam reveals no significant lesions or deformities. Canals clear .TMs normal. Hearing is grossly decreased L > R. Nose: External nasal exam reveals no deformity or inflammation. Nasal mucosa are pink and moist. No lesions or exudates noted.   Mouth: Oral mucosa and oropharynx reveal no lesions or exudates. Teeth in good repair. Neck: No deformities, masses, or tenderness noted. Range of motion & Thyroid normal. Lungs: Normal respiratory effort; chest expands symmetrically. Lungs are clear to auscultation without rales, wheezes, or increased work of breathing. Heart: Normal rate and rhythm. Accentuated S1; normal S2. No gallop, click, or rub.S4 with slurring. No murmur. Abdomen: Bowel sounds normal; abdomen soft and nontender. No masses, organomegaly or hernias noted. Genitalia: Genitalia normal except for left varices. Prostate is essentially normal ; minimal  asymmetry R > L  w/o nodularity or induration.                                    Musculoskeletal/extremities: No  deformity or scoliosis noted of  the thoracic or lumbar spine. No clubbing, cyanosis, edema, or significant extremity  deformity noted. Range of motion normal .Tone & strength  normal.Joints normal . Nail health good. Able to lie down & sit up w/o help. Negative SLR bilaterally Vascular: Carotid, radial artery, dorsalis pedis and  posterior tibial pulses are full and equal. No bruits present. Neurologic: Alert and oriented x3. Deep tendon reflexes symmetrical and normal.  Skin: Intact without suspicious lesions or rashes. Lymph: No cervical, axillary, or inguinal lymphadenopathy present. Psych: Mood and affect are normal. Normally interactive                                                                                        Assessment & Plan:  #1 comprehensive physical exam; no acute findings #2 see Problem List with Assessments & Recommendations Plan: see Orders

## 2013-01-01 NOTE — Patient Instructions (Addendum)
Preventive Health Care: Exercise at least 30-45 minutes a day,  3-4 days a week.  Eat a low-fat diet with lots of fruits and vegetables, up to 7-9 servings per day.  Consume less than 40 grams of sugar per day from foods & drinks with High Fructose Corn Sugar as #1,2,3 or # 4 on label.  If you activate My Chart; the results can be released to you as soon as they populate from the lab. If you choose not to use this program; the labs have to be reviewed, copied & mailed   causing a delay in getting the results to you.  Take the EKG to any emergency room or preop visits. There are nonspecific changes; as long as there is no new change these are not clinically significant . If the old EKG is not available for comparison; it may result in unnecessary hospitalization for observation with significant unnecessary expense.

## 2013-01-02 LAB — BASIC METABOLIC PANEL
BUN: 17 mg/dL (ref 6–23)
CO2: 27 mEq/L (ref 19–32)
Calcium: 9.5 mg/dL (ref 8.4–10.5)
Creatinine, Ser: 0.9 mg/dL (ref 0.4–1.5)
Glucose, Bld: 77 mg/dL (ref 70–99)
Sodium: 139 mEq/L (ref 135–145)

## 2013-01-02 LAB — CBC WITH DIFFERENTIAL/PLATELET
Basophils Absolute: 0 10*3/uL (ref 0.0–0.1)
Eosinophils Absolute: 0.1 10*3/uL (ref 0.0–0.7)
Hemoglobin: 14.8 g/dL (ref 13.0–17.0)
Lymphocytes Relative: 24.6 % (ref 12.0–46.0)
MCHC: 34 g/dL (ref 30.0–36.0)
Neutro Abs: 6.1 10*3/uL (ref 1.4–7.7)
Platelets: 206 10*3/uL (ref 150.0–400.0)
RDW: 13.2 % (ref 11.5–14.6)

## 2013-01-02 LAB — HEPATIC FUNCTION PANEL
Albumin: 4.3 g/dL (ref 3.5–5.2)
Alkaline Phosphatase: 50 U/L (ref 39–117)
Total Bilirubin: 1.1 mg/dL (ref 0.3–1.2)

## 2013-01-02 LAB — PSA: PSA: 0.82 ng/mL (ref 0.10–4.00)

## 2013-01-02 LAB — TSH: TSH: 1.47 u[IU]/mL (ref 0.35–5.50)

## 2013-01-02 LAB — LIPID PANEL
Cholesterol: 187 mg/dL (ref 0–200)
VLDL: 15.8 mg/dL (ref 0.0–40.0)

## 2013-02-08 ENCOUNTER — Other Ambulatory Visit: Payer: Self-pay | Admitting: Internal Medicine

## 2013-02-16 HISTORY — PX: OTHER SURGICAL HISTORY: SHX169

## 2013-02-24 ENCOUNTER — Encounter: Payer: Self-pay | Admitting: Internal Medicine

## 2013-02-25 ENCOUNTER — Ambulatory Visit (INDEPENDENT_AMBULATORY_CARE_PROVIDER_SITE_OTHER): Payer: BC Managed Care – PPO | Admitting: Internal Medicine

## 2013-02-25 ENCOUNTER — Encounter: Payer: Self-pay | Admitting: Internal Medicine

## 2013-02-25 VITALS — BP 122/76 | HR 63 | Temp 98.1°F | Wt 170.0 lb

## 2013-02-25 DIAGNOSIS — J209 Acute bronchitis, unspecified: Secondary | ICD-10-CM

## 2013-02-25 MED ORDER — AZITHROMYCIN 250 MG PO TABS: ORAL_TABLET | ORAL | Status: DC

## 2013-02-25 MED ORDER — HYDROCODONE-HOMATROPINE 5-1.5 MG/5ML PO SYRP: 5.0000 mL | ORAL_SOLUTION | Freq: Four times a day (QID) | ORAL | Status: DC | PRN

## 2013-02-25 NOTE — Progress Notes (Signed)
  Subjective:    Patient ID: Ryan Cobb, male    DOB: 1951/10/26, 62 y.o.   MRN: [id number]  HPI The respiratory tract symptoms began  02/22/13 as sneezing, chest congestion, & productive cough with clear sputum.  Exposures reported  to wife who is on treatment. No allergic or environmental factors as triggers except recent travel [place] .  Significant active  associated symptoms include dental pain, sore throat.  Cough is associated with  dyspnea on stairs. No wheezing.    Flu shot current  Treatment with Flonase,  Mucinex was partially effective. There is no history of asthma . He has  seasonal  allergies.  The patient had quit smoking in 1981                Review of Systems Myalgias and arthralgias were present but not associated but due to cold weather.  Symptoms not present include frontal headache,facial pain, nasal purulence, earache, &  otic discharge. Fever, chills, sweats were not present . Itchy/  watery eyes  were not noted.       Objective:   Physical Exam  General appearance:good health ;well nourished; no acute distress or increased work of breathing is present.   Eyes: No conjunctival inflammation or lid edema is present.  Ears:  External ear exam shows no significant lesions or deformities.  Otoscopic examination reveals clear canals, tympanic membranes are intact bilaterally without bulging, retraction, inflammation or discharge. Nose:  External nasal examination shows no deformity or inflammation. Nasal mucosa are pink and moist without lesions or exudates. No septal dislocation or deviation.No obstruction to airflow.  Oral exam: Dental hygiene is good; lips and gums are healthy appearing.There is no oropharyngeal erythema or exudate noted.  Neck:  No deformities, masses, or tenderness noted.   Heart:  Normal rate and regular rhythm. S1 and S2 normal without gallop, click, rub or murmur.  Lungs:Chest clear to auscultation; no wheezes,  rhonchi,rales ,or rubs present.No increased work of breathing.   Extremities:  No cyanosis, edema, or clubbing  noted  No  lymphadenopathy about the head, neck, or axilla noted.  Skin: Warm & dry           Assessment & Plan:  #1 acute bronchitis w/o bronchospasm or sinusitis Plan: See orders and recommendations

## 2013-02-25 NOTE — Telephone Encounter (Signed)
Called pt. He is scheduled for 1:00pm this afternoon.

## 2013-02-25 NOTE — Patient Instructions (Addendum)

## 2013-05-19 ENCOUNTER — Other Ambulatory Visit: Payer: Self-pay | Admitting: Internal Medicine

## 2013-08-13 ENCOUNTER — Encounter: Payer: Self-pay | Admitting: Family Medicine

## 2013-08-13 ENCOUNTER — Ambulatory Visit (INDEPENDENT_AMBULATORY_CARE_PROVIDER_SITE_OTHER): Payer: BC Managed Care – PPO | Admitting: Family Medicine

## 2013-08-13 VITALS — BP 154/82 | HR 54 | Temp 98.6°F | Wt 183.2 lb

## 2013-08-13 DIAGNOSIS — I10 Essential (primary) hypertension: Secondary | ICD-10-CM

## 2013-08-13 MED ORDER — LISINOPRIL-HYDROCHLOROTHIAZIDE 20-25 MG PO TABS: 1.0000 | ORAL_TABLET | Freq: Every day | ORAL | Status: DC

## 2013-08-13 NOTE — Patient Instructions (Addendum)

## 2013-08-14 ENCOUNTER — Encounter: Payer: Self-pay | Admitting: Family Medicine

## 2013-08-14 DIAGNOSIS — E669 Obesity, unspecified: Secondary | ICD-10-CM | POA: Insufficient documentation

## 2013-08-14 NOTE — Progress Notes (Signed)
  Subjective:    Patient here for follow-up of elevated blood pressure.  He  is not adherent to a low-salt diet.  Blood pressure is not well controlled at home. Cardiac symptoms: none. Patient denies: chest pain, chest pressure/discomfort, claudication, dyspnea, exertional chest pressure/discomfort, fatigue, irregular heart beat, lower extremity edema, near-syncope, orthopnea, palpitations, paroxysmal nocturnal dyspnea, syncope and tachypnea. Cardiovascular risk factors: advanced age (older than 85 for men, 53 for women), dyslipidemia, hypertension and male gender. Use of agents associated with hypertension: none. History of target organ damage: none.  The following portions of the patient's history were reviewed and updated as appropriate: allergies, current medications, past family history, past medical history, past social history, past surgical history and problem list.  Review of Systems Pertinent items are noted in HPI.     Objective:    BP 154/82  Pulse 54  Temp(Src) 98.6 F (37 C) (Oral)  Wt 183 lb 3.2 oz (83.099 kg)  BMI 30.41 kg/m2  SpO2 96% General appearance: alert, cooperative, appears stated age and no distress Nose: Nares normal. Septum midline. Mucosa normal. No drainage or sinus tenderness. Throat: lips, mucosa, and tongue normal; teeth and gums normal Neck: no adenopathy, supple, symmetrical, trachea midline and thyroid not enlarged, symmetric, no tenderness/mass/nodules Lungs: clear to auscultation bilaterally Heart: S1, S2 normal Extremities: extremities normal, atraumatic, no cyanosis or edema    Assessment:    Hypertension, stage 1 . Evidence of target organ damage: none.    Plan:    Medication: increase to lisinopril 20/25. Dietary sodium restriction. Regular aerobic exercise. Check blood pressures 2-3 times weekly and record. Follow up: 3 weeks and as needed.

## 2013-08-14 NOTE — Assessment & Plan Note (Addendum)
Inc lisinopril 20/25 Watch salt in diet F/u 3-4 week s with hopp

## 2013-09-13 ENCOUNTER — Ambulatory Visit (INDEPENDENT_AMBULATORY_CARE_PROVIDER_SITE_OTHER): Payer: BC Managed Care – PPO | Admitting: Internal Medicine

## 2013-09-13 ENCOUNTER — Encounter: Payer: Self-pay | Admitting: Internal Medicine

## 2013-09-13 VITALS — BP 137/83 | HR 58 | Temp 97.7°F | Resp 16 | Wt 174.0 lb

## 2013-09-13 DIAGNOSIS — I10 Essential (primary) hypertension: Secondary | ICD-10-CM

## 2013-09-13 LAB — CREATININE, SERUM: Creatinine, Ser: 1 mg/dL (ref 0.4–1.5)

## 2013-09-13 LAB — BUN: BUN: 18 mg/dL (ref 6–23)

## 2013-09-13 MED ORDER — LISINOPRIL-HYDROCHLOROTHIAZIDE 20-25 MG PO TABS: 1.0000 | ORAL_TABLET | Freq: Every day | ORAL | Status: DC

## 2013-09-13 NOTE — Patient Instructions (Addendum)

## 2013-09-13 NOTE — Assessment & Plan Note (Signed)
Good control See labs

## 2013-09-13 NOTE — Progress Notes (Signed)
  Subjective:    Patient ID: Ryan Cobb, male    DOB: 12-09-1951, 62 y.o.   MRN: [id number]  HPI CHRONIC HYPERTENSION follow-up:  Home blood pressure range 140-150/80s  Patient is compliant with medications  No adverse effects noted from medication  Exercise program as biking & calisthentics @ 4   times per week for 30-60 minutes  On low-salt diet         Review of Systems No chest pain, palpitations, dyspnea,cough, claudication,edema or paroxysmal nocturnal dyspnea described  Some positional lightheadedness. No headache, epistaxis, or syncope       Objective:   Physical Exam  Appears healthy and well-nourished & in no acute distress  No carotid bruits are present.No neck pain distention present at 10 - 15 degrees. Thyroid normal to palpation  Heart rhythm and rate are normal with no significant murmurs or gallops. Accentuated S1  Chest is clear with no increased work of breathing  There is no evidence of aortic aneurysm or renal artery bruits  Abdomen soft with no organomegaly or masses. No HJR  No clubbing, cyanosis or edema present.  Pedal pulses are intact   No ischemic skin changes are present . Nails healthy   Alert and oriented. Strength, tone normal         Assessment & Plan:  #1HTN Plan: see Orders

## 2013-09-19 ENCOUNTER — Encounter: Payer: Self-pay | Admitting: Internal Medicine

## 2013-10-24 ENCOUNTER — Other Ambulatory Visit: Payer: Self-pay

## 2013-12-17 ENCOUNTER — Encounter: Payer: Self-pay | Admitting: Internal Medicine

## 2013-12-17 ENCOUNTER — Ambulatory Visit (INDEPENDENT_AMBULATORY_CARE_PROVIDER_SITE_OTHER): Payer: BC Managed Care – PPO | Admitting: Internal Medicine

## 2013-12-17 ENCOUNTER — Other Ambulatory Visit: Payer: Self-pay | Admitting: Internal Medicine

## 2013-12-17 VITALS — BP 119/69 | HR 48 | Temp 98.4°F | Wt 191.6 lb

## 2013-12-17 DIAGNOSIS — J209 Acute bronchitis, unspecified: Secondary | ICD-10-CM

## 2013-12-17 MED ORDER — AZITHROMYCIN 250 MG PO TABS: ORAL_TABLET | ORAL | Status: DC

## 2013-12-17 MED ORDER — BENZONATATE 200 MG PO CAPS: 200.0000 mg | ORAL_CAPSULE | Freq: Three times a day (TID) | ORAL | Status: DC | PRN

## 2013-12-17 MED ORDER — PREDNISONE 20 MG PO TABS: 20.0000 mg | ORAL_TABLET | Freq: Two times a day (BID) | ORAL | Status: DC

## 2013-12-17 NOTE — Progress Notes (Signed)
   Subjective:    Patient ID: Ryan Cobb, male    DOB: Dec 09, 1951, 62 y.o.   MRN: [id number]  HPI  Symptoms began 12/11/13 as shortness of breath and weakness after 30 minutes on elliptical machine. This was followed by cough which has been productive of yellow sputum. He describes a cough as "raspy". It did improve with narcotic cough syrup but this caused headaches. There's been some wheezing accompanying they shortness of breath.  His headaches are in the occipital not frontal area.  The shortness of breath and weakness post exercise were not associated with chest pain or palpitations.  He has no history of asthma; he quit smoking in 1981.    Review of Systems As stated he denies frontal headache; he also denies facial pain, nasal purulence, dental pain, sore throat, otic pain, otic discharge  He does take an antihistamine; he has not had extrinsic symptoms of itchy, watery eyes, or sneezing with present illness.  He has had no definite fever, chills, or sweats.       Objective:   Physical Exam  General appearance:good health ;well nourished; no acute distress or increased work of breathing is present.  No  lymphadenopathy about the head, neck, or axilla noted.   Eyes: No conjunctival inflammation or lid edema is present. .  Ears:  External ear exam shows no significant lesions or deformities.  Otoscopic examination reveals clear canals, tympanic membranes are intact bilaterally without bulging, retraction, inflammation or discharge.  Nose:  External nasal examination shows no deformity or inflammation. Nasal mucosa are pink and moist without lesions or exudates. No septal dislocation or deviation.No obstruction to airflow.   Oral exam: Dental hygiene is good; lips and gums are healthy appearing.There is no oropharyngeal erythema or exudate noted.   Neck:  No deformities,  masses, or tenderness noted.      Heart:  Normal rate and regular rhythm. S1 and S2 normal without  gallop, murmur, click, rub or other extra sounds.   Lungs:Chest clear to auscultation; no wheezes, rhonchi,rales ,or rubs present.No increased work of breathing.  Intermittent raspy cough Extremities:  No cyanosis or clubbing  noted    Skin: Warm & dry .         Assessment & Plan:  #1 acute bronchitis without associated rhinosinusitis  Plan :see orders

## 2013-12-17 NOTE — Progress Notes (Signed)
Pre visit review using our clinic review tool, if applicable. No additional management support is needed unless otherwise documented below in the visit note. 

## 2013-12-17 NOTE — Patient Instructions (Signed)
Carry room temperature water and sip liberally after coughing.The prednisone can be discontinued once the cough has been resolved for 36 hours; it does not need to be weaned. Plain Mucinex (NOT D) for thick secretions ;force NON dairy fluids .   Nasal cleansing in the shower as discussed with lather of mild shampoo.After 10 seconds wash off lather while  exhaling through nostrils. Make sure that all residual soap is removed to prevent irritation.  Fluticasone 1 spray in each nostril twice a day as needed. Use the "crossover" technique into opposite nostril spraying toward opposite ear @ 45 degree angle, not straight up into nostril.  Use a Neti pot daily only  as needed for significant sinus congestion; going from open side to congested side . Plain Allegra (NOT D )  160 daily , Loratidine 10 mg , OR Zyrtec 10 mg @ bedtime  as needed for itchy eyes & sneezing.

## 2013-12-17 NOTE — Telephone Encounter (Signed)
Montelukast refilled per protocol. JG//CMA 

## 2014-03-15 ENCOUNTER — Other Ambulatory Visit: Payer: Self-pay | Admitting: Internal Medicine

## 2014-04-25 ENCOUNTER — Ambulatory Visit (INDEPENDENT_AMBULATORY_CARE_PROVIDER_SITE_OTHER): Payer: BC Managed Care – PPO | Admitting: Internal Medicine

## 2014-04-25 ENCOUNTER — Encounter: Payer: Self-pay | Admitting: Internal Medicine

## 2014-04-25 ENCOUNTER — Other Ambulatory Visit (INDEPENDENT_AMBULATORY_CARE_PROVIDER_SITE_OTHER): Payer: BC Managed Care – PPO

## 2014-04-25 VITALS — BP 138/90 | HR 61 | Temp 98.0°F | Resp 14 | Ht 67.0 in | Wt 183.4 lb

## 2014-04-25 DIAGNOSIS — R9431 Abnormal electrocardiogram [ECG] [EKG]: Secondary | ICD-10-CM

## 2014-04-25 DIAGNOSIS — I10 Essential (primary) hypertension: Secondary | ICD-10-CM

## 2014-04-25 DIAGNOSIS — Z0000 Encounter for general adult medical examination without abnormal findings: Secondary | ICD-10-CM

## 2014-04-25 DIAGNOSIS — J209 Acute bronchitis, unspecified: Secondary | ICD-10-CM

## 2014-04-25 LAB — HEPATIC FUNCTION PANEL
ALT: 27 U/L (ref 0–53)
AST: 27 U/L (ref 0–37)
Albumin: 4.2 g/dL (ref 3.5–5.2)
Alkaline Phosphatase: 57 U/L (ref 39–117)
BILIRUBIN TOTAL: 0.9 mg/dL (ref 0.2–1.2)
Bilirubin, Direct: 0.1 mg/dL (ref 0.0–0.3)
Total Protein: 6.9 g/dL (ref 6.0–8.3)

## 2014-04-25 LAB — BASIC METABOLIC PANEL
BUN: 12 mg/dL (ref 6–23)
CALCIUM: 9.5 mg/dL (ref 8.4–10.5)
CO2: 29 meq/L (ref 19–32)
Chloride: 100 mEq/L (ref 96–112)
Creatinine, Ser: 1 mg/dL (ref 0.4–1.5)
GFR: 79.88 mL/min (ref >60.00)
GLUCOSE: 98 mg/dL (ref 70–99)
Potassium: 3.7 mEq/L (ref 3.5–5.1)
Sodium: 138 mEq/L (ref 135–145)

## 2014-04-25 LAB — CBC WITH DIFFERENTIAL/PLATELET
BASOS ABS: 0 10*3/uL (ref 0.0–0.1)
Basophils Relative: 0.2 % (ref 0.0–3.0)
Eosinophils Absolute: 0.3 10*3/uL (ref 0.0–0.7)
Eosinophils Relative: 2.5 % (ref 0.0–5.0)
HEMATOCRIT: 45.1 % (ref 39.0–52.0)
Hemoglobin: 15.3 g/dL (ref 13.0–17.0)
LYMPHS ABS: 2.2 10*3/uL (ref 0.7–4.0)
LYMPHS PCT: 20.2 % (ref 12.0–46.0)
MCHC: 33.9 g/dL (ref 30.0–36.0)
MCV: 90.7 fl (ref 78.0–100.0)
Monocytes Absolute: 0.5 10*3/uL (ref 0.1–1.0)
Monocytes Relative: 4.1 % (ref 3.0–12.0)
Neutro Abs: 8.1 10*3/uL — ABNORMAL HIGH (ref 1.4–7.7)
Neutrophils Relative %: 73 % (ref 43.0–77.0)
PLATELETS: 230 10*3/uL (ref 150.0–400.0)
RBC: 4.98 Mil/uL (ref 4.22–5.81)
RDW: 12.9 % (ref 11.5–15.5)
WBC: 11 10*3/uL — ABNORMAL HIGH (ref 4.0–10.5)

## 2014-04-25 LAB — LIPID PANEL
CHOLESTEROL: 171 mg/dL (ref 0–200)
HDL: 40.2 mg/dL (ref >39.00)
LDL Cholesterol: 111 mg/dL — ABNORMAL HIGH (ref 0–99)
Total CHOL/HDL Ratio: 4
Triglycerides: 98 mg/dL (ref 0.0–149.0)
VLDL: 19.6 mg/dL (ref 0.0–40.0)

## 2014-04-25 LAB — PSA: PSA: 1.04 ng/mL (ref 0.10–4.00)

## 2014-04-25 LAB — TSH: TSH: 1.5 u[IU]/mL (ref 0.35–4.50)

## 2014-04-25 MED ORDER — AMOXICILLIN-POT CLAVULANATE 875-125 MG PO TABS: 1.0000 | ORAL_TABLET | Freq: Two times a day (BID) | ORAL | Status: DC

## 2014-04-25 NOTE — Patient Instructions (Addendum)
Your next office appointment will be determined based upon review of your pending labs . Those instructions will be transmitted to you through My Chart    Minimal Blood Pressure Goal= AVERAGE < 140/90;  Ideal is an AVERAGE < 135/85. This AVERAGE should be calculated from @ least 5-7 BP readings taken @ different times of day on different days of week. You should not respond to isolated BP readings , but rather the AVERAGE for that week .Please bring your  blood pressure cuff to office visits to verify that it is reliable.It  can also be checked against the blood pressure device at the pharmacy.  

## 2014-04-25 NOTE — Assessment & Plan Note (Signed)
Lipids, LFTs, TSH  

## 2014-04-25 NOTE — Assessment & Plan Note (Signed)
Blood pressure goals reviewed. BMET 

## 2014-04-25 NOTE — Progress Notes (Signed)
Subjective:    Patient ID: Ryan Cobb, male    DOB: [birth year], 63 y.o.   MRN: [id number]  HPI  He  is here for a physical;acute issues include RTI symptoms as of 04/21/14  A heart healthy diet is followed; exercise encompasses 25-35 minutes 3  times per week as  Bike/walk/elliptical without symptoms.  Family history is negative for premature coronary disease. Advanced cholesterol testing reveals  LDL goal is less than 100 ; ideally < 70 . To date no statin.  Low dose ASA taken. BP @ home 120s/70-80s.    Review of Systems Specifically denied are  chest pain, palpitations, dyspnea, or claudication.   At this time he denies frontal headache, facial pain, dental pain, or otic discharge. He has had some discomfort anterior to the right ear. He's had some yellow nasal discharge as well as a greater volume of yellow sputum. He is not having fever, chills, sweats.  Significantly he does take amoxicillin for SBE prophylaxis with dental work       Objective:   Physical Exam   Significant or distinguishing findings include accentuated S1 without murmur. There is crepitus, fusiform change, and decreased range of motion of the left knee. The prostate is upper limits of normal to minimally enlarged. There is minimal asymmetry. There is slight induration to the lateral aspect of the right lobe.  Gen.: Healthy and well-nourished in appearance. Alert, appropriate and cooperative throughout exam. Appears younger than stated age  Head: Normocephalic without obvious abnormalities  Eyes: No corneal or conjunctival inflammation noted. Pupils equal round reactive to light and accommodation. Extraocular motion intact.  Ears: External  ear exam reveals no significant lesions or deformities. Canals clear .TMs normal. Hearing is grossly decreased bilaterally. Nose: External nasal exam reveals no deformity or inflammation. Nasal mucosa are pink and moist. No lesions or exudates noted.   Mouth: Oral  mucosa and oropharynx reveal no lesions or exudates. Teeth in good repair. Neck: No deformities, masses, or tenderness noted. Range of motion decreased. Thyroid normal. Lungs: Normal respiratory effort; chest expands symmetrically. Lungs are clear to auscultation without rales, wheezes, or increased work of breathing. Heart: Normal rate and rhythm. Normal S2. No gallop, click, or rub.  Abdomen: Bowel sounds normal; abdomen soft and nontender. No masses, organomegaly or hernias noted. Genitalia: Genitalia normal except for left varices.See prostate exam above.  Musculoskeletal/extremities: No deformity or scoliosis noted of  the thoracic or lumbar spine.  No clubbing, cyanosis, edema, or significant extremity  deformity noted. Range of motion normal .Tone & strength normal. Hand joints normal  Fingernail  health good. Able to lie down & sit up w/o help. Negative SLR bilaterally Vascular: Carotid, radial artery, dorsalis pedis and  posterior tibial pulses are full and equal. No bruits present. Neurologic: Alert and oriented x3. Deep tendon reflexes symmetrical and normal.  Gait normal . Skin: Intact without suspicious lesions or rashes. Lymph: No cervical, axillary, or inguinal lymphadenopathy present. Psych: Mood and affect are normal. Normally interactive  Assessment & Plan:  #1 comprehensive physical exam; no acute findings #2 RTI #3 new NS ST-T changes on EKG  Plan: see Orders  & Recommendations

## 2014-04-25 NOTE — Progress Notes (Signed)
Pre visit review using our clinic review tool, if applicable. No additional management support is needed unless otherwise documented below in the visit note. 

## 2014-05-19 ENCOUNTER — Other Ambulatory Visit: Payer: Self-pay | Admitting: Internal Medicine

## 2014-05-27 ENCOUNTER — Encounter (HOSPITAL_COMMUNITY): Payer: BC Managed Care – PPO

## 2014-06-03 ENCOUNTER — Ambulatory Visit (HOSPITAL_COMMUNITY)
Admission: RE | Admit: 2014-06-03 | Discharge: 2014-06-03 | Disposition: A | Discharge status: Home / Self Care | Payer: BC Managed Care – PPO | Source: Ambulatory Visit | Attending: Internal Medicine | Admitting: Internal Medicine

## 2014-06-03 DIAGNOSIS — R9431 Abnormal electrocardiogram [ECG] [EKG]: Secondary | ICD-10-CM | POA: Insufficient documentation

## 2014-06-03 NOTE — Procedures (Signed)
Exercise Treadmill Test  Pre-Exercise Testing Evaluation Rhythm: normal sinus  Rate: 92   ST Segments:  no significant ST changes at rest     Test  Exercise Tolerance Test Ordering MD: Glenard Haring  Interpreting MD: Kerman Passey  Unique Test No:   Treadmill:  1  Indication for ETT: chest pain - rule out ischemia  Contraindication to ETT: No   Stress Modality: exercise - treadmill  Cardiac Imaging Performed: non   Protocol: standard Bruce - maximal  Max BP:  186/92  Max MPHR (bpm): 156 85% MPR (bpm): 132  MPHR obtained (bpm):  155 % MPHR obtained: 99  Reached 85% MPHR (min:sec):   Total Exercise Time (min-sec): 10:28  Workload in METS:  12.40 Borg Scale:   Reason ETT Terminated:  fatigue    ST Segment Analysis At Rest:  With Exercise:   Other Information Arrhythmia:  No Angina during ETT:  absent (0) Quality of ETT:  diagnostic  ETT Interpretation:  normal - no evidence of ischemia by ST analysis; minimal upsloping STsegments in V4-V6 at peak exercise.  Comments: Excellent exercise tolerance for Age. Hypertensive BP response to exercise. Nl GXT - Duke TM Score + 10.  LOW RISK   Recommendations: No EKG evidence of Myocardial Ischemia at an adequate HR response to exercise.  Additional Imaging not required.  Leonie Man, M.D., M.S. Interventional Cardiologist   Pager # [phone] 06/04/2014

## 2014-07-22 ENCOUNTER — Other Ambulatory Visit: Payer: Self-pay | Admitting: Internal Medicine

## 2015-01-23 ENCOUNTER — Other Ambulatory Visit: Payer: Self-pay | Admitting: Internal Medicine

## 2015-01-23 ENCOUNTER — Ambulatory Visit (INDEPENDENT_AMBULATORY_CARE_PROVIDER_SITE_OTHER): Payer: BLUE CROSS/BLUE SHIELD | Admitting: Internal Medicine

## 2015-01-23 ENCOUNTER — Other Ambulatory Visit (INDEPENDENT_AMBULATORY_CARE_PROVIDER_SITE_OTHER): Payer: BLUE CROSS/BLUE SHIELD

## 2015-01-23 ENCOUNTER — Encounter: Payer: Self-pay | Admitting: Internal Medicine

## 2015-01-23 VITALS — BP 128/80 | HR 53 | Temp 98.4°F | Resp 15 | Ht 67.0 in | Wt 195.2 lb

## 2015-01-23 DIAGNOSIS — E785 Hyperlipidemia, unspecified: Secondary | ICD-10-CM

## 2015-01-23 DIAGNOSIS — I10 Essential (primary) hypertension: Secondary | ICD-10-CM

## 2015-01-23 DIAGNOSIS — Z86010 Personal history of colonic polyps: Secondary | ICD-10-CM

## 2015-01-23 LAB — BASIC METABOLIC PANEL
BUN: 13 mg/dL (ref 6–23)
CALCIUM: 9.5 mg/dL (ref 8.4–10.5)
CO2: 29 mEq/L (ref 19–32)
Chloride: 103 mEq/L (ref 96–112)
Creatinine, Ser: 1.08 mg/dL (ref 0.40–1.50)
GFR: 72.92 mL/min (ref >60.00)
GLUCOSE: 102 mg/dL — AB (ref 70–99)
POTASSIUM: 4 meq/L (ref 3.5–5.1)
SODIUM: 138 meq/L (ref 135–145)

## 2015-01-23 LAB — CBC WITH DIFFERENTIAL/PLATELET
BASOS ABS: 0 10*3/uL (ref 0.0–0.1)
Basophils Relative: 0.5 % (ref 0.0–3.0)
EOS PCT: 1.9 % (ref 0.0–5.0)
Eosinophils Absolute: 0.2 10*3/uL (ref 0.0–0.7)
HCT: 45.3 % (ref 39.0–52.0)
HEMOGLOBIN: 15.9 g/dL (ref 13.0–17.0)
Lymphocytes Relative: 27 % (ref 12.0–46.0)
Lymphs Abs: 2.3 10*3/uL (ref 0.7–4.0)
MCHC: 35.2 g/dL (ref 30.0–36.0)
MCV: 88.2 fl (ref 78.0–100.0)
MONO ABS: 0.7 10*3/uL (ref 0.1–1.0)
Monocytes Relative: 8 % (ref 3.0–12.0)
NEUTROS ABS: 5.4 10*3/uL (ref 1.4–7.7)
NEUTROS PCT: 62.6 % (ref 43.0–77.0)
PLATELETS: 245 10*3/uL (ref 150.0–400.0)
RBC: 5.13 Mil/uL (ref 4.22–5.81)
RDW: 13.2 % (ref 11.5–15.5)
WBC: 8.7 10*3/uL (ref 4.0–10.5)

## 2015-01-23 LAB — HEPATIC FUNCTION PANEL
ALK PHOS: 57 U/L (ref 39–117)
ALT: 22 U/L (ref 0–53)
AST: 21 U/L (ref 0–37)
Albumin: 4.2 g/dL (ref 3.5–5.2)
Bilirubin, Direct: 0.1 mg/dL (ref 0.0–0.3)
Total Bilirubin: 0.5 mg/dL (ref 0.2–1.2)
Total Protein: 7.2 g/dL (ref 6.0–8.3)

## 2015-01-23 LAB — TSH: TSH: 2.64 u[IU]/mL (ref 0.35–4.50)

## 2015-01-23 MED ORDER — LISINOPRIL-HYDROCHLOROTHIAZIDE 20-25 MG PO TABS: ORAL_TABLET | ORAL | Status: DC

## 2015-01-23 NOTE — Progress Notes (Signed)
Pre visit review using our clinic review tool, if applicable. No additional management support is needed unless otherwise documented below in the visit note. 

## 2015-01-23 NOTE — Progress Notes (Signed)
   Subjective:    Patient ID: Ryan Cobb, male    DOB: 03-22-1951, 64 y.o.   MRN: [id number]  HPI The patient is here to assess status of active health conditions.  PMH, FH, & Social History reviewed & updated.  He has been compliant with his antihypertensive medication as well as his medication for allergies without adverse effects. He is on a modified heart healthy, low-salt diet. Blood pressure ranges 125-130/80-85. He exercises 3 times a week 20-30 minutes at a high level without cardiopulmonary symptoms.  Paternal grandfather had a stroke in his 44s. There is no family history heart attack.  Colonoscopy is up-to-date; it was last performed 2011. He was negative that time. He does have a history of hyperplastic polyps. He has no active GI symptoms. There is no family history colon cancer.  Review of Systems   Chest pain, palpitations, tachycardia, exertional dyspnea, paroxysmal nocturnal dyspnea, claudication or edema are absent.  Unexplained weight loss, abdominal pain, significant dyspepsia, dysphagia, melena, rectal bleeding, or persistently small caliber stools are denied.  GU ROS negative except nocturia X1    Objective:   Physical Exam Appears healthy and well-nourished & in no acute distress  No carotid bruits are present.No neck vein distention present at 10 - 15 degrees. Thyroid normal to palpation  Heart rhythm and rate are normal with no gallop or murmur  Chest is clear with no increased work of breathing  There is no evidence of aortic aneurysm or renal artery bruits  Abdomen soft with no organomegaly or masses. No HJR  No clubbing, cyanosis or edema present.  Pedal pulses are intact   No ischemic skin changes are present . Fingernails/ toenails healthy   Alert and oriented. Strength, tone, DTRs reflexes normal         Assessment & Plan:  See Current Assessment & Plan in Problem List under specific Diagnosis

## 2015-01-23 NOTE — Assessment & Plan Note (Signed)
Blood pressure goals reviewed. BMET 

## 2015-01-23 NOTE — Assessment & Plan Note (Signed)
CBC & dif 

## 2015-01-23 NOTE — Assessment & Plan Note (Signed)
NMR Lipoprofile LFTs, TSH

## 2015-01-23 NOTE — Patient Instructions (Signed)
Your next office appointment will be determined based upon review of your pending labs . Those instructions will be transmitted to you through My Chart  Critical values will be called . Followup as needed for your acute issue. Please report any significant change in your symptoms.Minimal Blood Pressure Goal= AVERAGE < 140/90;  Ideal is an AVERAGE < 135/85. This AVERAGE should be calculated from @ least 5-7 BP readings taken @ different times of day on different days of week. You should not respond to isolated BP readings , but rather the AVERAGE for that week .Please bring your  blood pressure cuff to office visits to verify that it is reliable.It  can also be checked against the blood pressure device at the pharmacy. Finger or wrist cuffs are not dependable; an arm cuff is.

## 2015-01-26 ENCOUNTER — Telehealth: Payer: Self-pay

## 2015-01-26 ENCOUNTER — Other Ambulatory Visit (INDEPENDENT_AMBULATORY_CARE_PROVIDER_SITE_OTHER): Payer: BLUE CROSS/BLUE SHIELD

## 2015-01-26 DIAGNOSIS — R739 Hyperglycemia, unspecified: Secondary | ICD-10-CM

## 2015-01-26 LAB — NMR LIPOPROFILE WITH LIPIDS
CHOLESTEROL, TOTAL: 190 mg/dL (ref 100–199)
HDL Particle Number: 27.7 umol/L — ABNORMAL LOW (ref >=30.5)
HDL SIZE: 8.8 nm — AB (ref >=9.2)
HDL-C: 45 mg/dL (ref >39)
LARGE HDL: 3 umol/L — AB (ref >=4.8)
LDL CALC: 121 mg/dL — AB (ref 0–99)
LDL PARTICLE NUMBER: 1639 nmol/L — AB (ref <1000)
LDL Size: 20.9 nm (ref >=20.8)
LP-IR Score: 39 (ref <=45)
Large VLDL-P: 0.9 nmol/L (ref <=2.7)
Small LDL Particle Number: 868 nmol/L — ABNORMAL HIGH (ref <=527)
Triglycerides: 122 mg/dL (ref 0–149)
VLDL Size: 40.4 nm (ref <=46.6)

## 2015-01-26 LAB — HEMOGLOBIN A1C: Hgb A1c MFr Bld: 5.9 % (ref 4.6–6.5)

## 2015-01-26 NOTE — Telephone Encounter (Signed)
Request for add a1c on has been faxed to lab

## 2015-02-02 ENCOUNTER — Encounter: Payer: Self-pay | Admitting: Internal Medicine

## 2015-03-05 ENCOUNTER — Encounter: Payer: Self-pay | Admitting: Internal Medicine

## 2015-03-17 ENCOUNTER — Encounter: Payer: Self-pay | Admitting: Internal Medicine

## 2015-03-23 ENCOUNTER — Other Ambulatory Visit: Payer: Self-pay | Admitting: Internal Medicine

## 2015-05-05 ENCOUNTER — Ambulatory Visit (AMBULATORY_SURGERY_CENTER): Payer: Self-pay

## 2015-05-05 VITALS — Ht 67.0 in | Wt 183.2 lb

## 2015-05-05 DIAGNOSIS — Z86010 Personal history of colonic polyps: Secondary | ICD-10-CM

## 2015-05-05 MED ORDER — NA SULFATE-K SULFATE-MG SULF 17.5-3.13-1.6 GM/177ML PO SOLN
ORAL | Status: DC
Start: 2015-05-05 — End: 2015-12-18

## 2015-05-05 NOTE — Progress Notes (Signed)
Per pt, no allergies to soy or egg products.Pt not taking any weight loss meds or using  O2 at home. 

## 2015-05-20 ENCOUNTER — Encounter: Payer: Self-pay | Admitting: Gastroenterology

## 2015-05-20 ENCOUNTER — Encounter: Payer: Self-pay | Admitting: Internal Medicine

## 2015-05-26 ENCOUNTER — Encounter: Payer: Self-pay | Admitting: Internal Medicine

## 2015-05-26 ENCOUNTER — Ambulatory Visit (AMBULATORY_SURGERY_CENTER): Payer: BLUE CROSS/BLUE SHIELD | Admitting: Internal Medicine

## 2015-05-26 VITALS — BP 126/73 | HR 54 | Temp 96.5°F | Resp 20 | Ht 67.0 in | Wt 183.0 lb

## 2015-05-26 DIAGNOSIS — D124 Benign neoplasm of descending colon: Secondary | ICD-10-CM | POA: Diagnosis not present

## 2015-05-26 DIAGNOSIS — D122 Benign neoplasm of ascending colon: Secondary | ICD-10-CM

## 2015-05-26 DIAGNOSIS — D123 Benign neoplasm of transverse colon: Secondary | ICD-10-CM

## 2015-05-26 DIAGNOSIS — Z86010 Personal history of colonic polyps: Secondary | ICD-10-CM | POA: Diagnosis not present

## 2015-05-26 MED ORDER — SODIUM CHLORIDE 0.9 % IV SOLN: 500.0000 mL | INTRAVENOUS | Status: DC

## 2015-05-26 NOTE — Progress Notes (Signed)
To recovery, report to Smith, RN, VSS 

## 2015-05-26 NOTE — Op Note (Addendum)
[place]  [address] [place], [id number]   COLONOSCOPY PROCEDURE REPORT  PATIENT: Ryan, Cobb  MR#: [id number] BIRTHDATE: December 21, 1950 , 33  yrs. old GENDER: male ENDOSCOPIST: Jerene Bears, MD REFERRED EH:[id number] Patterson, M.D. PROCEDURE DATE:  05/26/2015 PROCEDURE:   Colonoscopy, surveillance and Colonoscopy with snare polypectomy First Screening Colonoscopy - Avg.  risk and is 50 yrs.  old or older - No.  Prior Negative Screening - Now for repeat screening. N/A  History of Adenoma - Now for follow-up colonoscopy & has been > or = to 3 yrs.  N/A  Polyps removed today? Yes ASA CLASS:   Class II INDICATIONS:Surveillance prior colonic neoplasia and PH hyperplastic polyps, ssibility of sessile serrated polyp, last 5 yrs ago MEDICATIONS: Monitored anesthesia care and Propofol 200 mg IV  DESCRIPTION OF PROCEDURE:   After the risks benefits and alternatives of the procedure were thoroughly explained, informed consent was obtained.  The digital rectal exam revealed no rectal mass.   The LB [id number]  endoscope was introduced through the anus and advanced to the cecum, which was identified by both the appendix and ileocecal valve. No adverse events experienced. The quality of the prep was good.  (Suprep was used)  The instrument was then slowly withdrawn as the colon was fully examined. Estimated blood loss is zero unless otherwise noted in this procedure report  COLON FINDINGS: A single flat polyp measuring 8 mm in size with a mucous cap was found in the ascending colon.  A polypectomy was performed with a cold snare.  The resection was complete, the polyp tissue was completely retrieved and sent to histology.   Two sessile polyps ranging between 3-52mm in size were found in the transverse colon and descending colon.  Polypectomies were performed with a cold snare.  The resection was complete, the polyp tissue was completely retrieved and sent to  histology.   There was moderate diverticulosis noted in the ascending colon and left colon with associated muscular hypertrophy.  Retroflexed views revealed no abnormalities. The time to cecum = 2.2 Withdrawal time = 12.7 The scope was withdrawn and the procedure completed. COMPLICATIONS: There were no immediate complications.  ENDOSCOPIC IMPRESSION: 1.   Single polyp was found in the ascending colon; polypectomy was performed with a cold snare 2.   Two sessile polyps ranging between 3-76mm in size were found in the transverse colon and descending colon; polypectomies were performed with a cold snare 3.   There was moderate diverticulosis noted in the ascending colon and left colon  RECOMMENDATIONS: 1.  Await pathology results 2.  High fiber diet 3.  Timing of repeat colonoscopy will be determined by pathology findings. 4.  You will receive a letter within 1-2 weeks with the results of your biopsy as well as final recommendations.  Please call my office if you have not received a letter after 3 weeks.  eSigned:  Jerene Bears, MD 05/26/2015 9:51 AM Revised: 05/26/2015 9:51 AM  cc: The Patient and Hendricks Limes, MD   PATIENT NAME:  Ryan, Cobb MR#: [id number]

## 2015-05-26 NOTE — Patient Instructions (Signed)
YOU HAD AN ENDOSCOPIC PROCEDURE TODAY AT [facility]:   Refer to the procedure report that was given to you for any specific questions about what was found during the examination.  If the procedure report does not answer your questions, please call your gastroenterologist to clarify.  If you requested that your care partner not be given the details of your procedure findings, then the procedure report has been included in a sealed envelope for you to review at your convenience later.  YOU SHOULD EXPECT: Some feelings of bloating in the abdomen. Passage of more gas than usual.  Walking can help get rid of the air that was put into your GI tract during the procedure and reduce the bloating. If you had a lower endoscopy (such as a colonoscopy or flexible sigmoidoscopy) you may notice spotting of blood in your stool or on the toilet paper. If you underwent a bowel prep for your procedure, you may not have a normal bowel movement for a few days.  Please Note:  You might notice some irritation and congestion in your nose or some drainage.  This is from the oxygen used during your procedure.  There is no need for concern and it should clear up in a day or so.  SYMPTOMS TO REPORT IMMEDIATELY:   Following lower endoscopy (colonoscopy or flexible sigmoidoscopy):  Excessive amounts of blood in the stool  Significant tenderness or worsening of abdominal pains  Swelling of the abdomen that is new, acute  Fever of 100F or higher  For urgent or emergent issues, a gastroenterologist can be reached at any hour by calling [phone].   DIET: Your first meal following the procedure should be a small meal and then it is ok to progress to your normal diet. Heavy or fried foods are harder to digest and may make you feel nauseous or bloated.  Likewise, meals heavy in dairy and vegetables can increase bloating.  Drink plenty of fluids but you should avoid alcoholic beverages for 24  hours.  ACTIVITY:  You should plan to take it easy for the rest of today and you should NOT DRIVE or use heavy machinery until tomorrow (because of the sedation medicines used during the test).    FOLLOW UP: Our staff will call the number listed on your records the next business day following your procedure to check on you and address any questions or concerns that you may have regarding the information given to you following your procedure. If we do not reach you, we will leave a message.  However, if you are feeling well and you are not experiencing any problems, there is no need to return our call.  We will assume that you have returned to your regular daily activities without incident.  If any biopsies were taken you will be contacted by phone or by letter within the next 1-3 weeks.  Please call us at [phone] if you have not heard about the biopsies in 3 weeks.    SIGNATURES/CONFIDENTIALITY: You and/or your care partner have signed paperwork which will be entered into your electronic medical record.  These signatures attest to the fact that that the information above on your After Visit Summary has been reviewed and is understood.  Full responsibility of the confidentiality of this discharge information lies with you and/or your care-partner.  Polyps/Diverticulosis handouts given Await pathology report You will receive a letter within 1-2 weeks with results and recommendations High fiber diet

## 2015-05-26 NOTE — Progress Notes (Signed)
Called to room to assist during endoscopic procedure.  Patient ID and intended procedure confirmed with present staff. Received instructions for my participation in the procedure from the performing physician.  

## 2015-05-27 ENCOUNTER — Telehealth: Payer: Self-pay

## 2015-05-27 NOTE — Telephone Encounter (Signed)
No answer, left voicemail message.

## 2015-06-01 ENCOUNTER — Encounter: Payer: Self-pay | Admitting: Internal Medicine

## 2015-07-22 ENCOUNTER — Ambulatory Visit (INDEPENDENT_AMBULATORY_CARE_PROVIDER_SITE_OTHER): Payer: BLUE CROSS/BLUE SHIELD | Admitting: Internal Medicine

## 2015-07-22 ENCOUNTER — Encounter: Payer: Self-pay | Admitting: Internal Medicine

## 2015-07-22 VITALS — BP 140/80 | HR 52 | Temp 97.8°F | Resp 16 | Wt 178.0 lb

## 2015-07-22 DIAGNOSIS — J069 Acute upper respiratory infection, unspecified: Secondary | ICD-10-CM

## 2015-07-22 MED ORDER — AZITHROMYCIN 250 MG PO TABS: ORAL_TABLET | ORAL | Status: DC

## 2015-07-22 NOTE — Progress Notes (Signed)
Pre visit review using our clinic review tool, if applicable. No additional management support is needed unless otherwise documented below in the visit note. 

## 2015-07-22 NOTE — Progress Notes (Signed)
   Subjective:    Patient ID: Ryan Cobb, male    DOB: August 12, 1951, 64 y.o.   MRN: [id number]  HPI  His symptoms began 07/19/15 as sore throat and burning in his ears. He gargled with saline with some benefit. He's also used a steroid nasal spray and cough drops with sore throat. He describes pain in the facial sinus area. He has itchy, watery eyes. The cough is productive of clear sputum; he does have shortness of breath climbing stairs.   Review of Systems He denies fever, chills, or sweats. He has no frontal headache. There is no nasal purulence; he's had no associated wheezing with the cough    Objective:   Physical Exam  General appearance:Adequately nourished; no acute distress or increased work of breathing is present.    Lymphatic: No  lymphadenopathy about the head, neck, or axilla .  Eyes: No conjunctival inflammation or lid edema is present. There is no scleral icterus.  Ears:  External ear exam shows no significant lesions or deformities.  Otoscopic examination reveals clear canals, tympanic membranes are intact bilaterally without bulging, retraction, inflammation or discharge.  Nose:  External nasal examination shows no deformity or inflammation. Nasal mucosa are erythematous without lesions or exudates No septal dislocation or deviation.No obstruction to airflow.   Oral exam: Dental hygiene is good; lips and gums are healthy appearing.There is no oropharyngeal erythema or exudate.Hoarse  Neck:  No deformities, thyromegaly, masses, or tenderness noted.   Supple with full range of motion without pain.   Heart:  Normal rate and regular rhythm. S1 and S2 normal without gallop, murmur, click, rub or other extra sounds.   Lungs:Chest clear to auscultation; no wheezes, rhonchi,rales ,or rubs present.  Extremities:  No cyanosis, edema, or clubbing  noted    Skin: Warm & dry w/o tenting or jaundice. No significant lesions or rash.       Assessment & Plan:  #1 upper  respiratory infection with pharyngitis # bronchitis , acute w/o bronchospasm Plan: See orders and recommendations

## 2015-07-22 NOTE — Patient Instructions (Signed)
Plain Mucinex (NOT D) for thick secretions ;force NON dairy fluids .   Nasal cleansing in the shower as discussed with lather of mild shampoo.After 10 seconds wash off lather while  exhaling through nostrils. Make sure that all residual soap is removed to prevent irritation.  Flonase OR Nasacort AQ 1 spray in each nostril twice a day as needed. Use the "crossover" technique into opposite nostril spraying toward opposite ear @ 45 degree angle, not straight up into nostril.  Plain Allegra (NOT D )  160 daily , Loratidine 10 mg , OR Zyrtec 10 mg @ bedtime  as needed for itchy eyes & sneezing.Zicam Melts or Zinc lozenges as per package label for sore throat .  Complementary options to boost immunity include  vitamin C 2000 mg daily; & Echinacea for 4-7 days.

## 2015-11-09 ENCOUNTER — Other Ambulatory Visit: Payer: Self-pay | Admitting: Orthopedic Surgery

## 2015-11-09 DIAGNOSIS — M5412 Radiculopathy, cervical region: Secondary | ICD-10-CM

## 2015-11-18 ENCOUNTER — Ambulatory Visit: Payer: BLUE CROSS/BLUE SHIELD | Admitting: Internal Medicine

## 2015-11-18 ENCOUNTER — Ambulatory Visit
Admission: RE | Admit: 2015-11-18 | Discharge: 2015-11-18 | Disposition: A | Discharge status: Home / Self Care | Payer: BLUE CROSS/BLUE SHIELD | Source: Ambulatory Visit | Attending: Orthopedic Surgery | Admitting: Orthopedic Surgery

## 2015-11-18 ENCOUNTER — Encounter: Payer: Self-pay | Admitting: Family

## 2015-11-18 ENCOUNTER — Ambulatory Visit (INDEPENDENT_AMBULATORY_CARE_PROVIDER_SITE_OTHER): Payer: BLUE CROSS/BLUE SHIELD | Admitting: Family

## 2015-11-18 VITALS — BP 152/98 | HR 57 | Temp 98.1°F | Resp 18 | Ht 67.0 in | Wt 183.4 lb

## 2015-11-18 DIAGNOSIS — J019 Acute sinusitis, unspecified: Secondary | ICD-10-CM

## 2015-11-18 DIAGNOSIS — M5412 Radiculopathy, cervical region: Secondary | ICD-10-CM

## 2015-11-18 DIAGNOSIS — J0100 Acute maxillary sinusitis, unspecified: Secondary | ICD-10-CM | POA: Insufficient documentation

## 2015-11-18 MED ORDER — HYDROCODONE-HOMATROPINE 5-1.5 MG/5ML PO SYRP: 5.0000 mL | ORAL_SOLUTION | Freq: Three times a day (TID) | ORAL | Status: DC | PRN

## 2015-11-18 MED ORDER — AMOXICILLIN-POT CLAVULANATE 875-125 MG PO TABS: 1.0000 | ORAL_TABLET | Freq: Two times a day (BID) | ORAL | Status: DC

## 2015-11-18 NOTE — Progress Notes (Signed)
Pre visit review using our clinic review tool, if applicable. No additional management support is needed unless otherwise documented below in the visit note. 

## 2015-11-18 NOTE — Assessment & Plan Note (Signed)
Symptoms and exam consistent with sinusitis. Start Augmentin. Start Hycodan as needed for cough and sleep. Continue over-the-counter medications as needed for symptom relief and supportive care. Follow-up if symptoms worsen or fail to improve. 

## 2015-11-18 NOTE — Patient Instructions (Signed)
Thank you for choosing [facility].  Summary/Instructions:  Your prescription(s) have been submitted to your pharmacy or been printed and provided for you. Please take as directed and contact our office if you believe you are having problem(s) with the medication(s) or have any questions.   If your symptoms worsen or fail to improve, please contact our office for further instruction, or in case of emergency go directly to the emergency room at the closest medical facility.   General Recommendations:    Please drink plenty of fluids.  Get plenty of rest   Sleep in humidified air  Use saline nasal sprays  Netti pot   OTC Medications:  Decongestants - helps relieve congestion   Flonase (generic fluticasone) or Nasacort (generic triamcinolone) - please make sure to use the "cross-over" technique at a 45 degree angle towards the opposite eye as opposed to straight up the nasal passageway.   If you have HIGH BLOOD PRESSURE - Coricidin HBP; AVOID any product that is -D as this contains pseudoephedrine which may increase your blood pressure.  Afrin (oxymetazoline) every 6-8 hours for up to 3 days.   Allergies - helps relieve runny nose, itchy eyes and sneezing   Claritin (generic loratidine), Allegra (fexofenidine), or Zyrtec (generic cyrterizine) for runny nose. These medications should not cause drowsiness.  Note - Benadryl (generic diphenhydramine) may be used however may cause drowsiness  Cough -   Delsym or Robitussin (generic dextromethorphan)  Expectorants - helps loosen mucus to ease removal   Mucinex (generic guaifenesin) as directed on the package.  Headaches / General Aches   Tylenol (generic acetaminophen) - DO NOT EXCEED 3 grams (3,000 mg) in a 24 hour time period  Advil/Motrin (generic ibuprofen)   Sore Throat -   Salt water gargle   Chloraseptic (generic benzocaine) spray or lozenges / Sucrets (generic dyclonine)    Sinusitis,  Adult Sinusitis is redness, soreness, and inflammation of the paranasal sinuses. Paranasal sinuses are air pockets within the bones of your face. They are located beneath your eyes, in the middle of your forehead, and above your eyes. In healthy paranasal sinuses, mucus is able to drain out, and air is able to circulate through them by way of your nose. However, when your paranasal sinuses are inflamed, mucus and air can become trapped. This can allow bacteria and other germs to grow and cause infection. Sinusitis can develop quickly and last only a short time (acute) or continue over a long period (chronic). Sinusitis that lasts for more than 12 weeks is considered chronic. CAUSES Causes of sinusitis include:  Allergies.  Structural abnormalities, such as displacement of the cartilage that separates your nostrils (deviated septum), which can decrease the air flow through your nose and sinuses and affect sinus drainage.  Functional abnormalities, such as when the small hairs (cilia) that line your sinuses and help remove mucus do not work properly or are not present. SIGNS AND SYMPTOMS Symptoms of acute and chronic sinusitis are the same. The primary symptoms are pain and pressure around the affected sinuses. Other symptoms include:  Upper toothache.  Earache.  Headache.  Bad breath.  Decreased sense of smell and taste.  A cough, which worsens when you are lying flat.  Fatigue.  Fever.  Thick drainage from your nose, which often is green and may contain pus (purulent).  Swelling and warmth over the affected sinuses. DIAGNOSIS Your health care provider will perform a physical exam. During your exam, your health care provider may perform any   of the following to help determine if you have acute sinusitis or chronic sinusitis:  Look in your nose for signs of abnormal growths in your nostrils (nasal polyps).  Tap over the affected sinus to check for signs of infection.  View the  inside of your sinuses using an imaging device that has a light attached (endoscope). If your health care provider suspects that you have chronic sinusitis, one or more of the following tests may be recommended:  Allergy tests.  Nasal culture. A sample of mucus is taken from your nose, sent to a lab, and screened for bacteria.  Nasal cytology. A sample of mucus is taken from your nose and examined by your health care provider to determine if your sinusitis is related to an allergy. TREATMENT Most cases of acute sinusitis are related to a viral infection and will resolve on their own within 10 days. Sometimes, medicines are prescribed to help relieve symptoms of both acute and chronic sinusitis. These may include pain medicines, decongestants, nasal steroid sprays, or saline sprays. However, for sinusitis related to a bacterial infection, your health care provider will prescribe antibiotic medicines. These are medicines that will help kill the bacteria causing the infection. Rarely, sinusitis is caused by a fungal infection. In these cases, your health care provider will prescribe antifungal medicine. For some cases of chronic sinusitis, surgery is needed. Generally, these are cases in which sinusitis recurs more than 3 times per year, despite other treatments. HOME CARE INSTRUCTIONS  Drink plenty of water. Water helps thin the mucus so your sinuses can drain more easily.  Use a humidifier.  Inhale steam 3-4 times a day (for example, sit in the bathroom with the shower running).  Apply a warm, moist washcloth to your face 3-4 times a day, or as directed by your health care provider.  Use saline nasal sprays to help moisten and clean your sinuses.  Take medicines only as directed by your health care provider.  If you were prescribed either an antibiotic or antifungal medicine, finish it all even if you start to feel better. SEEK IMMEDIATE MEDICAL CARE IF:  You have increasing pain or  severe headaches.  You have nausea, vomiting, or drowsiness.  You have swelling around your face.  You have vision problems.  You have a stiff neck.  You have difficulty breathing.   This information is not intended to replace advice given to you by your health care provider. Make sure you discuss any questions you have with your health care provider.   Document Released: 12/05/2005 Document Revised: 12/26/2014 Document Reviewed: 12/20/2011 Elsevier Interactive Patient Education 2016 [employer]   

## 2015-11-18 NOTE — Progress Notes (Signed)
Subjective:    Patient ID: Ryan Cobb, male    DOB: 02-28-1951, 64 y.o.   MRN: [id number]  Chief Complaint  Patient presents with  . Nasal Congestion    has congestion, cough, ear pain, x4 days    HPI:  Ryan Cobb is a 64 y.o. male who  has a past medical history of Hyperlipidemia; Hypertension; Seasonal rhinitis; Colon polyps (2006); and Basal cell adenoma. and presents today for an acute office visit.  This is a new problem. Associated symptoms of congestion, cough, and ear pain has been going on for approximately 4-5 days. Severity of the cough and pressure is enough to disturb his sleep. Modifying factors include Mucinex which has helped very little, but has not improved the symptoms. Course has been worsening since the initial onset. Notes there has been no signs of improvements. Denies any recent antibiotic use.   No Known Allergies   Current Outpatient Prescriptions on File Prior to Visit  Medication Sig Dispense Refill  . aspirin 81 MG tablet Take 81 mg by mouth as directed. 3 by mouth daily     . b complex vitamins tablet Take 1 tablet by mouth daily.    Marland Kitchen Bioflavonoid Products (ESTER C PO) Take 500 mg by mouth daily.    . calcium carbonate (OS-CAL) 600 MG TABS tablet Take 600 mg by mouth daily.    . Cholecalciferol (VITAMIN D3) 3000 UNITS TABS Take by mouth. Take 5000 units 4 times a week    . Coenzyme Q10 (CO Q 10) 10 MG CAPS Take by mouth daily.    . fluticasone (FLONASE) 50 MCG/ACT nasal spray Place 1 spray into the nose 2 (two) times daily. 16 g 11  . Garlic 123XX123 MG CAPS Take by mouth daily.    Marland Kitchen LECITHIN PO Take 1,200 mg by mouth daily.    Marland Kitchen lisinopril-hydrochlorothiazide (PRINZIDE,ZESTORETIC) 20-25 MG per tablet TAKE 1 TABLET EACH DAY. 90 tablet 3  . loratadine (CLARITIN) 10 MG tablet Take 10 mg by mouth daily.    . Magnesium 250 MG TABS Take by mouth. Magnesium 240 mg daily    . montelukast (SINGULAIR) 10 MG tablet TAKE ONE TABLET AT BEDTIME. 30 tablet  11  . Multiple Vitamins-Minerals (CENTRUM SILVER PO) Take by mouth daily.    . Multiple Vitamins-Minerals (OCUVITE EYE + MULTI PO) Take by mouth daily.    . Na Sulfate-K Sulfate-Mg Sulf (SUPREP BOWEL PREP) SOLN Suprep as directed / no substitutions 354 mL 0  . Omega 3 1200 MG CAPS Take by mouth daily.     No current facility-administered medications on file prior to visit.     Review of Systems  Constitutional: Negative for fever and chills.  HENT: Positive for congestion, ear pain, postnasal drip, sinus pressure and sore throat.   Respiratory: Positive for cough and chest tightness. Negative for shortness of breath and wheezing.   Neurological: Negative for headaches.      Objective:    BP 152/98 mmHg  Pulse 57  Temp(Src) 98.1 F (36.7 C) (Oral)  Resp 18  Ht 5\' 7"  (1.702 m)  Wt 183 lb 6.4 oz (83.19 kg)  BMI 28.72 kg/m2  SpO2 98% Nursing note and vital signs reviewed.  Physical Exam  Constitutional: He is oriented to person, place, and time. He appears well-developed and well-nourished. No distress.  HENT:  Right Ear: Hearing, tympanic membrane, external ear and ear canal normal.  Left Ear: Hearing, tympanic membrane, external ear and ear canal  normal.  Nose: Right sinus exhibits maxillary sinus tenderness and frontal sinus tenderness. Left sinus exhibits maxillary sinus tenderness and frontal sinus tenderness.  Mouth/Throat: Uvula is midline, oropharynx is clear and moist and mucous membranes are normal.  Neck: Neck supple.  Cardiovascular: Normal rate, regular rhythm, normal heart sounds and intact distal pulses.   Pulmonary/Chest: Effort normal and breath sounds normal.  Neurological: He is alert and oriented to person, place, and time.  Skin: Skin is warm and dry.  Psychiatric: He has a normal mood and affect. His behavior is normal. Judgment and thought content normal.       Assessment & Plan:   Problem List Items Addressed This Visit      Respiratory    Sinusitis, acute - Primary    Symptoms and exam consistent with sinusitis. Start Augmentin. Start Hycodan as needed for cough and sleep. Continue over-the-counter medications as needed for symptom relief and supportive care. Follow-up if symptoms worsen or fail to improve.      Relevant Medications   amoxicillin-clavulanate (AUGMENTIN) 875-125 MG tablet   HYDROcodone-homatropine (HYCODAN) 5-1.5 MG/5ML syrup

## 2015-11-30 ENCOUNTER — Other Ambulatory Visit (INDEPENDENT_AMBULATORY_CARE_PROVIDER_SITE_OTHER): Payer: PRIVATE HEALTH INSURANCE

## 2015-11-30 ENCOUNTER — Ambulatory Visit (INDEPENDENT_AMBULATORY_CARE_PROVIDER_SITE_OTHER): Payer: PRIVATE HEALTH INSURANCE | Admitting: Internal Medicine

## 2015-11-30 ENCOUNTER — Encounter: Payer: Self-pay | Admitting: Internal Medicine

## 2015-11-30 VITALS — BP 162/90 | HR 57 | Temp 97.7°F | Ht 67.0 in | Wt 184.0 lb

## 2015-11-30 DIAGNOSIS — I10 Essential (primary) hypertension: Secondary | ICD-10-CM

## 2015-11-30 LAB — BASIC METABOLIC PANEL
BUN: 19 mg/dL (ref 6–23)
CHLORIDE: 105 meq/L (ref 96–112)
CO2: 29 meq/L (ref 19–32)
CREATININE: 0.97 mg/dL (ref 0.40–1.50)
Calcium: 9.2 mg/dL (ref 8.4–10.5)
GFR: 82.32 mL/min (ref >60.00)
GLUCOSE: 84 mg/dL (ref 70–99)
Potassium: 4.1 mEq/L (ref 3.5–5.1)
Sodium: 140 mEq/L (ref 135–145)

## 2015-11-30 MED ORDER — CARVEDILOL 6.25 MG PO TABS: ORAL_TABLET | ORAL | Status: DC

## 2015-11-30 NOTE — Patient Instructions (Signed)
Minimal Blood Pressure Goal= AVERAGE < 140/90;  Ideal is an AVERAGE < 135/85. This AVERAGE should be calculated from @ least 5-7 BP readings taken @ different times of day on different days of week. You should not respond to isolated BP readings , but rather the AVERAGE for that week .Please bring your  blood pressure cuff to office visits to verify that it is reliable.It  can also be checked against the blood pressure device at the pharmacy. Finger or wrist cuffs are not dependable; an arm cuff is.  The carvedilol will be titrated up to a maximum of 25 mg twice a day if the blood pressure  does not average less than 140/90. The increase in dose can be initiated every 5 days.

## 2015-11-30 NOTE — Progress Notes (Signed)
Pre visit review using our clinic review tool, if applicable. No additional management support is needed unless otherwise documented below in the visit note. 

## 2015-11-30 NOTE — Progress Notes (Signed)
   Subjective:    Patient ID: Ryan Cobb, male    DOB: 1951-02-19, 64 y.o.   MRN: [id number]  HPI He was recently treated for sinus infection and was found have a blood pressure of 180/95. He's been monitoring it since; it runs in the 170s over 90s-110. He also has been on meloxicam because of pain in his neck. Surgery is planned in January of 2017.  He's been restricting salt and decreased his red meat intake. He has not been able to exercise because of the neck issues.  Review of Systems  Chest pain, palpitations, tachycardia, exertional dyspnea, paroxysmal nocturnal dyspnea, claudication or edema are absent.      Objective:   Physical Exam Appears healthy and well-nourished & in no acute distress.  Arteriolar narrowing is noted on fundal exam  No carotid bruits are present.No neck vein distention present at 10 - 15 degrees. Thyroid normal to palpation  Heart rhythm and rate are normal with no gallop or murmur. S1 and S2 or accentuated.  Chest is clear with no increased work of breathing  There is no evidence of aortic aneurysm or renal artery bruits  Abdomen soft with no organomegaly or masses. No HJR  No clubbing, cyanosis or edema present.  Pedal pulses are intact   No ischemic skin changes are present . Fingernails healthy   Alert and oriented. Strength, tone, DTRs reflexes normal      Assessment & Plan:  #1 uncontrolled blood pressure  #2 cervical radiculopathy  Plan: See orders and recommendations

## 2015-12-08 ENCOUNTER — Other Ambulatory Visit: Payer: Self-pay | Admitting: Orthopedic Surgery

## 2015-12-18 ENCOUNTER — Encounter: Payer: Self-pay | Admitting: Internal Medicine

## 2015-12-18 ENCOUNTER — Ambulatory Visit (INDEPENDENT_AMBULATORY_CARE_PROVIDER_SITE_OTHER): Payer: PRIVATE HEALTH INSURANCE | Admitting: Internal Medicine

## 2015-12-18 ENCOUNTER — Other Ambulatory Visit (INDEPENDENT_AMBULATORY_CARE_PROVIDER_SITE_OTHER): Payer: PRIVATE HEALTH INSURANCE

## 2015-12-18 VITALS — BP 124/70 | HR 52 | Temp 98.1°F | Resp 16 | Wt 182.0 lb

## 2015-12-18 DIAGNOSIS — M5010 Cervical disc disorder with radiculopathy, unspecified cervical region: Secondary | ICD-10-CM | POA: Diagnosis not present

## 2015-12-18 DIAGNOSIS — Z0000 Encounter for general adult medical examination without abnormal findings: Secondary | ICD-10-CM | POA: Diagnosis not present

## 2015-12-18 DIAGNOSIS — Z23 Encounter for immunization: Secondary | ICD-10-CM | POA: Diagnosis not present

## 2015-12-18 DIAGNOSIS — I10 Essential (primary) hypertension: Secondary | ICD-10-CM | POA: Diagnosis not present

## 2015-12-18 DIAGNOSIS — E785 Hyperlipidemia, unspecified: Secondary | ICD-10-CM

## 2015-12-18 LAB — COMPREHENSIVE METABOLIC PANEL
ALK PHOS: 64 U/L (ref 39–117)
ALT: 23 U/L (ref 0–53)
AST: 23 U/L (ref 0–37)
Albumin: 4.4 g/dL (ref 3.5–5.2)
BILIRUBIN TOTAL: 0.6 mg/dL (ref 0.2–1.2)
BUN: 21 mg/dL (ref 6–23)
CO2: 28 mEq/L (ref 19–32)
CREATININE: 1.07 mg/dL (ref 0.40–1.50)
Calcium: 10.4 mg/dL (ref 8.4–10.5)
Chloride: 103 mEq/L (ref 96–112)
GFR: 73.5 mL/min (ref >60.00)
GLUCOSE: 127 mg/dL — AB (ref 70–99)
Potassium: 4.1 mEq/L (ref 3.5–5.1)
SODIUM: 140 meq/L (ref 135–145)
TOTAL PROTEIN: 7.4 g/dL (ref 6.0–8.3)

## 2015-12-18 LAB — CBC WITH DIFFERENTIAL/PLATELET
BASOS ABS: 0.1 10*3/uL (ref 0.0–0.1)
Basophils Relative: 0.4 % (ref 0.0–3.0)
Eosinophils Absolute: 0.3 10*3/uL (ref 0.0–0.7)
Eosinophils Relative: 2.7 % (ref 0.0–5.0)
HCT: 48.4 % (ref 39.0–52.0)
Hemoglobin: 16.5 g/dL (ref 13.0–17.0)
LYMPHS ABS: 2.5 10*3/uL (ref 0.7–4.0)
Lymphocytes Relative: 20.2 % (ref 12.0–46.0)
MCHC: 34.2 g/dL (ref 30.0–36.0)
MCV: 90.1 fl (ref 78.0–100.0)
MONO ABS: 0.8 10*3/uL (ref 0.1–1.0)
Monocytes Relative: 6.5 % (ref 3.0–12.0)
NEUTROS PCT: 70.2 % (ref 43.0–77.0)
Neutro Abs: 8.7 10*3/uL — ABNORMAL HIGH (ref 1.4–7.7)
Platelets: 250 10*3/uL (ref 150.0–400.0)
RBC: 5.37 Mil/uL (ref 4.22–5.81)
RDW: 13.4 % (ref 11.5–15.5)
WBC: 12.4 10*3/uL — AB (ref 4.0–10.5)

## 2015-12-18 LAB — LIPID PANEL
CHOL/HDL RATIO: 4
Cholesterol: 198 mg/dL (ref 0–200)
HDL: 51.8 mg/dL (ref >39.00)
LDL Cholesterol: 128 mg/dL — ABNORMAL HIGH (ref 0–99)
NONHDL: 145.83
Triglycerides: 87 mg/dL (ref 0.0–149.0)
VLDL: 17.4 mg/dL (ref 0.0–40.0)

## 2015-12-18 LAB — HEMOGLOBIN A1C: Hgb A1c MFr Bld: 5.8 % (ref 4.6–6.5)

## 2015-12-18 LAB — TSH: TSH: 1.88 u[IU]/mL (ref 0.35–4.50)

## 2015-12-18 NOTE — Progress Notes (Signed)
Pre visit review using our clinic review tool, if applicable. No additional management support is needed unless otherwise documented below in the visit note. 

## 2015-12-18 NOTE — Progress Notes (Signed)
Subjective:    Patient ID: Ryan Cobb, male    DOB: 11/15/1951, 64 y.o.   MRN: [id number]  HPI He is here for a physical exam.   He is having neck surgery next month - fusion.  He has neck pain and pain down his left arm.  He is not exercising as much because of that.    Hypertension: He is taking his medication daily. He was recently started on coreg in addition to his other medication.  He is compliant with a low sodium diet.  He denies chest pain, palpitations, edema, shortness of breath and regular headaches. He is not exercising regularly.  He does monitor his blood pressure at home and it has been coming down with the addition of coreg.  He wonders if he should continue both medications.   Medications and allergies reviewed with patient and updated if appropriate.  Patient Active Problem List   Diagnosis Date Noted  . Sinusitis, acute 11/18/2015  . Nonspecific abnormal electrocardiogram (ECG) (EKG) 01/01/2013  . Hyperlipidemia 03/04/2010  . Essential hypertension 03/04/2010  . ALLERGIC RHINITIS 03/04/2010  . History of colonic polyps 03/04/2010    Current Outpatient Prescriptions on File Prior to Visit  Medication Sig Dispense Refill  . aspirin 81 MG tablet Take 81 mg by mouth as directed. 3 by mouth daily     . b complex vitamins tablet Take 1 tablet by mouth daily.    Marland Kitchen Bioflavonoid Products (ESTER C PO) Take 500 mg by mouth daily.    . calcium carbonate (OS-CAL) 600 MG TABS tablet Take 600 mg by mouth daily.    . carvedilol (COREG) 6.25 MG tablet 1/2 bid 60 tablet 1  . Cholecalciferol (VITAMIN D3) 3000 UNITS TABS Take by mouth. Take 5000 units 4 times a week    . Coenzyme Q10 (CO Q 10) 10 MG CAPS Take by mouth daily.    . fluticasone (FLONASE) 50 MCG/ACT nasal spray Place 1 spray into the nose 2 (two) times daily. 16 g 11  . Garlic 123XX123 MG CAPS Take by mouth daily.    Marland Kitchen LECITHIN PO Take 1,200 mg by mouth daily.    Marland Kitchen lisinopril-hydrochlorothiazide  (PRINZIDE,ZESTORETIC) 20-25 MG per tablet TAKE 1 TABLET EACH DAY. 90 tablet 3  . loratadine (CLARITIN) 10 MG tablet Take 10 mg by mouth daily.    . Magnesium 250 MG TABS Take by mouth. Magnesium 240 mg daily    . montelukast (SINGULAIR) 10 MG tablet TAKE ONE TABLET AT BEDTIME. 30 tablet 11  . Multiple Vitamins-Minerals (CENTRUM SILVER PO) Take by mouth daily.    . Multiple Vitamins-Minerals (OCUVITE EYE + MULTI PO) Take by mouth daily.    . Omega 3 1200 MG CAPS Take by mouth daily.     No current facility-administered medications on file prior to visit.    Past Medical History  Diagnosis Date  . Hyperlipidemia   . Hypertension   . Seasonal rhinitis     Flonase as needed; Singulair daily  . Colon polyps 2006    hyperplastic  . Basal cell adenoma     history of    Past Surgical History  Procedure Laterality Date  . Knee arthroscopy  2007    Dr Berenice Primas  . Knee surgery  1973    Left Knee Surgery   . Replacement total knee  2009    Dr Berenice Primas  . Lipoma excision      Right Waist  . Cervical laminectomy  2010  Dr. Carloyn Manner  . Inguinal hernia repair  2003    left  . Colonoscopy w/ polypectomy  2006    negative 2011  . Labrum tear & bone spur surgery  02/2013    Dr Berenice Primas  . Appendectomy  1995  . Abdominal cyst      removed in 1994    Social History   Social History  . Marital Status: Married    Spouse Name: N/A  . Number of Children: N/A  . Years of Education: N/A   Social History Main Topics  . Smoking status: Former Smoker    Types: Cigarettes    Quit date: 12/20/1979  . Smokeless tobacco: Never Used     Comment: smoked 1967-1981, up to 1 ppd  . Alcohol Use: 1.8 - 2.4 oz/week    3-4 Standard drinks or equivalent per week     Comment: occasional beer   . Drug Use: No  . Sexual Activity:    Partners: Female   Other Topics Concern  . Not on file   Social History Narrative    Review of Systems  Constitutional: Negative for fever, chills and fatigue.  HENT:  Positive for hearing loss and tinnitus.   Eyes: Negative for visual disturbance.  Respiratory: Negative for cough, shortness of breath and wheezing.   Cardiovascular: Negative for chest pain, palpitations and leg swelling.  Gastrointestinal: Negative for nausea, abdominal pain, constipation and blood in stool.       No GERD  Genitourinary: Negative for dysuria and difficulty urinating.  Musculoskeletal: Positive for back pain (intermittent, herniated disc) and neck pain.  Skin: Negative for rash.  Neurological: Positive for weakness (left UE - mild) and headaches (from neck problems). Negative for dizziness, light-headedness and numbness.  Psychiatric/Behavioral: Negative for sleep disturbance and dysphoric mood. The patient is not nervous/anxious.        Objective:   Filed Vitals:   12/18/15 0809  BP: 124/70  Pulse: 52  Temp: 98.1 F (36.7 C)  Resp: 16   Filed Weights   12/18/15 0809  Weight: 182 lb (82.555 kg)   Body mass index is 28.5 kg/(m^2).   Physical Exam  Constitutional: He is oriented to person, place, and time. He appears well-developed and well-nourished. No distress.  HENT:  Head: Normocephalic and atraumatic.  Right Ear: External ear normal.  Left Ear: External ear normal.  Mouth/Throat: Oropharynx is clear and moist.  Normal ear canals and TM  Eyes: Conjunctivae are normal.  Neck: Neck supple. No tracheal deviation present. No thyromegaly present.  Cardiovascular: Normal rate, regular rhythm, normal heart sounds and intact distal pulses.   No murmur heard. Pulmonary/Chest: Effort normal and breath sounds normal. No respiratory distress. He has no wheezes. He has no rales.  Abdominal: Soft. Bowel sounds are normal. He exhibits no distension. There is no tenderness. There is no rebound.  Genitourinary: Prostate normal.  Musculoskeletal: He exhibits no edema.  Lymphadenopathy:    He has no cervical adenopathy.  Neurological: He is alert and oriented to  person, place, and time.  Skin: Skin is warm and dry. He is not diaphoretic.  Psychiatric: He has a normal mood and affect. His behavior is normal.          Assessment & Plan:    Physical exam: Screening blood owork ordered Colonoscopy up to date prevnar today, other immunizations up to date Sees derm annually Eye exams up to date EKG up to date - done last year as well as a  stress test, EKG also ordered to be done prior to surgery next month Will get back into exercise after surgery  See Problem List for chronic problems A/P

## 2015-12-18 NOTE — Patient Instructions (Signed)
We have reviewed your prior records including labs and tests today.  Test(s) ordered today. Your results will be released to MyChart (or called to you) after review, usually within 72hours after test completion. If any changes need to be made, you will be notified at that same time.  All other Health Maintenance issues reviewed.   All recommended immunizations and age-appropriate screenings are up-to-date.  prevnar vaccine administered today.   Medications reviewed and updated . No changes recommended at this time.      Health Maintenance, Male A healthy lifestyle and preventative care can promote health and wellness.  Maintain regular health, dental, and eye exams.  Eat a healthy diet. Foods like vegetables, fruits, whole grains, low-fat dairy products, and lean protein foods contain the nutrients you need and are low in calories. Decrease your intake of foods high in solid fats, added sugars, and salt. Get information about a proper diet from your health care provider, if necessary.  Regular physical exercise is one of the most important things you can do for your health. Most adults should get at least 150 minutes of moderate-intensity exercise (any activity that increases your heart rate and causes you to sweat) each week. In addition, most adults need muscle-strengthening exercises on 2 or more days a week.   Maintain a healthy weight. The body mass index (BMI) is a screening tool to identify possible weight problems. It provides an estimate of body fat based on height and weight. Your health care provider can find your BMI and can help you achieve or maintain a healthy weight. For males 20 years and older:  A BMI below 18.5 is considered underweight.  A BMI of 18.5 to 24.9 is normal.  A BMI of 25 to 29.9 is considered overweight.  A BMI of 30 and above is considered obese.  Maintain normal blood lipids and cholesterol by exercising and minimizing your intake of saturated fat.  Eat a balanced diet with plenty of fruits and vegetables. Blood tests for lipids and cholesterol should begin at age 20 and be repeated every 5 years. If your lipid or cholesterol levels are high, you are over age 50, or you are at high risk for heart disease, you may need your cholesterol levels checked more frequently.Ongoing high lipid and cholesterol levels should be treated with medicines if diet and exercise are not working.  If you smoke, find out from your health care provider how to quit. If you do not use tobacco, do not start.  Lung cancer screening is recommended for adults aged 55-80 years who are at high risk for developing lung cancer because of a history of smoking. A yearly low-dose CT scan of the lungs is recommended for people who have at least a 30-pack-year history of smoking and are current smokers or have quit within the past 15 years. A pack year of smoking is smoking an average of 1 pack of cigarettes a day for 1 year (for example, a 30-pack-year history of smoking could mean smoking 1 pack a day for 30 years or 2 packs a day for 15 years). Yearly screening should continue until the smoker has stopped smoking for at least 15 years. Yearly screening should be stopped for people who develop a health problem that would prevent them from having lung cancer treatment.  If you choose to drink alcohol, do not have more than 2 drinks per day. One drink is considered to be 12 oz (360 mL) of beer, 5 oz (150 mL)   of wine, or 1.5 oz (45 mL) of liquor.  Avoid the use of street drugs. Do not share needles with anyone. Ask for help if you need support or instructions about stopping the use of drugs.  High blood pressure causes heart disease and increases the risk of stroke. High blood pressure is more likely to develop in:  People who have blood pressure in the end of the normal range (100-139/85-89 mm Hg).  People who are overweight or obese.  People who are African American.  If you are  18-39 years of age, have your blood pressure checked every 3-5 years. If you are 40 years of age or older, have your blood pressure checked every year. You should have your blood pressure measured twice--once when you are at a hospital or clinic, and once when you are not at a hospital or clinic. Record the average of the two measurements. To check your blood pressure when you are not at a hospital or clinic, you can use:  An automated blood pressure machine at a pharmacy.  A home blood pressure monitor.  If you are 45-79 years old, ask your health care provider if you should take aspirin to prevent heart disease.  Diabetes screening involves taking a blood sample to check your fasting blood sugar level. This should be done once every 3 years after age 45 if you are at a normal weight and without risk factors for diabetes. Testing should be considered at a younger age or be carried out more frequently if you are overweight and have at least 1 risk factor for diabetes.  Colorectal cancer can be detected and often prevented. Most routine colorectal cancer screening begins at the age of 50 and continues through age 75. However, your health care provider may recommend screening at an earlier age if you have risk factors for colon cancer. On a yearly basis, your health care provider may provide home test kits to check for hidden blood in the stool. A small camera at the end of a tube may be used to directly examine the colon (sigmoidoscopy or colonoscopy) to detect the earliest forms of colorectal cancer. Talk to your health care provider about this at age 50 when routine screening begins. A direct exam of the colon should be repeated every 5-10 years through age 75, unless early forms of precancerous polyps or small growths are found.  People who are at an increased risk for hepatitis B should be screened for this virus. You are considered at high risk for hepatitis B if:  You were born in a country where  hepatitis B occurs often. Talk with your health care provider about which countries are considered high risk.  Your parents were born in a high-risk country and you have not received a shot to protect against hepatitis B (hepatitis B vaccine).  You have HIV or AIDS.  You use needles to inject street drugs.  You live with, or have sex with, someone who has hepatitis B.  You are a man who has sex with other men (MSM).  You get hemodialysis treatment.  You take certain medicines for conditions like cancer, organ transplantation, and autoimmune conditions.  Hepatitis C blood testing is recommended for all people born from 1945 through 1965 and any individual with known risk factors for hepatitis C.  Healthy men should no longer receive prostate-specific antigen (PSA) blood tests as part of routine cancer screening. Talk to your health care provider about prostate cancer screening.  Testicular cancer   screening is not recommended for adolescents or adult males who have no symptoms. Screening includes self-exam, a health care provider exam, and other screening tests. Consult with your health care provider about any symptoms you have or any concerns you have about testicular cancer.  Practice safe sex. Use condoms and avoid high-risk sexual practices to reduce the spread of sexually transmitted infections (STIs).  You should be screened for STIs, including gonorrhea and chlamydia if:  You are sexually active and are younger than 24 years.  You are older than 24 years, and your health care provider tells you that you are at risk for this type of infection.  Your sexual activity has changed since you were last screened, and you are at an increased risk for chlamydia or gonorrhea. Ask your health care provider if you are at risk.  If you are at risk of being infected with HIV, it is recommended that you take a prescription medicine daily to prevent HIV infection. This is called pre-exposure  prophylaxis (PrEP). You are considered at risk if:  You are a man who has sex with other men (MSM).  You are a heterosexual man who is sexually active with multiple partners.  You take drugs by injection.  You are sexually active with a partner who has HIV.  Talk with your health care provider about whether you are at high risk of being infected with HIV. If you choose to begin PrEP, you should first be tested for HIV. You should then be tested every 3 months for as long as you are taking PrEP.  Use sunscreen. Apply sunscreen liberally and repeatedly throughout the day. You should seek shade when your shadow is shorter than you. Protect yourself by wearing long sleeves, pants, a wide-brimmed hat, and sunglasses year round whenever you are outdoors.  Tell your health care provider of new moles or changes in moles, especially if there is a change in shape or color. Also, tell your health care provider if a mole is larger than the size of a pencil eraser.  A one-time screening for abdominal aortic aneurysm (AAA) and surgical repair of large AAAs by ultrasound is recommended for men aged 65-75 years who are current or former smokers.  Stay current with your vaccines (immunizations).   This information is not intended to replace advice given to you by your health care provider. Make sure you discuss any questions you have with your health care provider.   Document Released: 06/02/2008 Document Revised: 12/26/2014 Document Reviewed: 05/02/2011 Elsevier Interactive Patient Education 2016 [employer] Inc[employer]     

## 2015-12-18 NOTE — Assessment & Plan Note (Signed)
BP controlled today  Continue both meds - we may be able to decrease the coreg, but will hold off for now Continue to monitor at home

## 2015-12-19 LAB — PSA, TOTAL AND FREE
PSA, Free Pct: 33 % (ref >25)
PSA, Free: 0.33 ng/mL
PSA: 1.01 ng/mL (ref <=4.00)

## 2015-12-21 ENCOUNTER — Encounter: Payer: Self-pay | Admitting: Internal Medicine

## 2015-12-28 NOTE — Pre-Procedure Instructions (Signed)
    Cace Orlov Derden  12/28/2015      [facility] - [place], [place] - [address] [place] [address] Phone: [phone] Fax: [phone]    Your procedure is scheduled on 01/06/16.  Report to [facility] Admitting at 630 A.M.  Call this number if you have problems the morning of surgery:  [phone]   Remember:  Do not eat food or drink liquids after midnight.  Take these medicines the morning of surgery with A SIP OF WATER --carvedilol,singular   Do not wear jewelry, make-up or nail polish.  Do not wear lotions, powders, or perfumes.  You may wear deodorant.  Do not shave 48 hours prior to surgery.  Men may shave face and neck.  Do not bring valuables to the hospital.  [facility] is not responsible for any belongings or valuables.  Contacts, dentures or bridgework may not be worn into surgery.  Leave your suitcase in the car.  After surgery it may be brought to your room.  For patients admitted to the hospital, discharge time will be determined by your treatment team.  Patients discharged the day of surgery will not be allowed to drive home.   Name and phone number of your driver:   Special instructions:    Please read over the following fact sheets that you were given. Pain Booklet, Coughing and Deep Breathing, Blood Transfusion Information, MRSA Information and Surgical Site Infection Prevention

## 2015-12-29 ENCOUNTER — Encounter (HOSPITAL_COMMUNITY)
Admission: RE | Admit: 2015-12-29 | Discharge: 2015-12-29 | Disposition: A | Discharge status: Home / Self Care | Payer: No Typology Code available for payment source | Source: Ambulatory Visit | Attending: Orthopedic Surgery | Admitting: Orthopedic Surgery

## 2015-12-29 ENCOUNTER — Encounter (HOSPITAL_COMMUNITY): Payer: Self-pay

## 2015-12-29 DIAGNOSIS — M5410 Radiculopathy, site unspecified: Secondary | ICD-10-CM | POA: Diagnosis not present

## 2015-12-29 DIAGNOSIS — Z01818 Encounter for other preprocedural examination: Secondary | ICD-10-CM

## 2015-12-29 DIAGNOSIS — Z0183 Encounter for blood typing: Secondary | ICD-10-CM | POA: Insufficient documentation

## 2015-12-29 DIAGNOSIS — R001 Bradycardia, unspecified: Secondary | ICD-10-CM | POA: Insufficient documentation

## 2015-12-29 DIAGNOSIS — R9431 Abnormal electrocardiogram [ECG] [EKG]: Secondary | ICD-10-CM | POA: Diagnosis not present

## 2015-12-29 DIAGNOSIS — Z01812 Encounter for preprocedural laboratory examination: Secondary | ICD-10-CM | POA: Insufficient documentation

## 2015-12-29 HISTORY — DX: Unspecified osteoarthritis, unspecified site: M19.90

## 2015-12-29 LAB — COMPREHENSIVE METABOLIC PANEL
ALT: 23 U/L (ref 17–63)
AST: 27 U/L (ref 15–41)
Albumin: 3.8 g/dL (ref 3.5–5.0)
Alkaline Phosphatase: 58 U/L (ref 38–126)
Anion gap: 10 (ref 5–15)
BUN: 20 mg/dL (ref 6–20)
CHLORIDE: 103 mmol/L (ref 101–111)
CO2: 28 mmol/L (ref 22–32)
CREATININE: 1.03 mg/dL (ref 0.61–1.24)
Calcium: 9.6 mg/dL (ref 8.9–10.3)
Glucose, Bld: 119 mg/dL — ABNORMAL HIGH (ref 65–99)
POTASSIUM: 4 mmol/L (ref 3.5–5.1)
Sodium: 141 mmol/L (ref 135–145)
TOTAL PROTEIN: 6.8 g/dL (ref 6.5–8.1)
Total Bilirubin: 0.9 mg/dL (ref 0.3–1.2)

## 2015-12-29 LAB — URINALYSIS, ROUTINE W REFLEX MICROSCOPIC
Bilirubin Urine: NEGATIVE
GLUCOSE, UA: NEGATIVE mg/dL
HGB URINE DIPSTICK: NEGATIVE
KETONES UR: NEGATIVE mg/dL
Leukocytes, UA: NEGATIVE
Nitrite: NEGATIVE
PROTEIN: NEGATIVE mg/dL
Specific Gravity, Urine: 1.01 (ref 1.005–1.030)
pH: 5.5 (ref 5.0–8.0)

## 2015-12-29 LAB — CBC WITH DIFFERENTIAL/PLATELET
Basophils Absolute: 0 10*3/uL (ref 0.0–0.1)
Basophils Relative: 1 %
EOS PCT: 4 %
Eosinophils Absolute: 0.3 10*3/uL (ref 0.0–0.7)
HCT: 43.6 % (ref 39.0–52.0)
Hemoglobin: 15.3 g/dL (ref 13.0–17.0)
LYMPHS ABS: 2.3 10*3/uL (ref 0.7–4.0)
LYMPHS PCT: 28 %
MCH: 31.2 pg (ref 26.0–34.0)
MCHC: 35.1 g/dL (ref 30.0–36.0)
MCV: 88.8 fL (ref 78.0–100.0)
MONO ABS: 0.7 10*3/uL (ref 0.1–1.0)
MONOS PCT: 8 %
Neutro Abs: 5 10*3/uL (ref 1.7–7.7)
Neutrophils Relative %: 59 %
PLATELETS: 201 10*3/uL (ref 150–400)
RBC: 4.91 MIL/uL (ref 4.22–5.81)
RDW: 13 % (ref 11.5–15.5)
WBC: 8.3 10*3/uL (ref 4.0–10.5)

## 2015-12-29 LAB — TYPE AND SCREEN
ABO/RH(D): O POS
Antibody Screen: NEGATIVE

## 2015-12-29 LAB — SURGICAL PCR SCREEN
MRSA, PCR: NEGATIVE
Staphylococcus aureus: POSITIVE — AB

## 2015-12-29 LAB — PROTIME-INR
INR: 1.1 (ref 0.00–1.49)
PROTHROMBIN TIME: 14.4 s (ref 11.6–15.2)

## 2015-12-29 LAB — APTT: aPTT: 32 seconds (ref 24–37)

## 2016-01-05 NOTE — H&P (Signed)
PREOPERATIVE H&P  Chief Complaint: Neck pain, left arm pain  HPI: Ryan Cobb is a 65 y.o. male who presents with ongoing pain in the neck and left arm. Patient is s/p a previous cervical fusion in 2010. A CT scan revealed a nonunion at C7/T1 and an MRI reveals bilareral NF stenosis C3-C7   Patient has failed multiple forms of conservative care and continues to have pain (see office notes for additional details regarding the patient's full course of treatment)  Past Medical History  Diagnosis Date  . Hyperlipidemia   . Hypertension   . Seasonal rhinitis     Flonase as needed; Singulair daily  . Colon polyps 2006    hyperplastic  . Basal cell adenoma     history of  . Arthritis    Past Surgical History  Procedure Laterality Date  . Knee arthroscopy  2007    Dr Berenice Primas  . Knee surgery  1973    Left Knee Surgery   . Replacement total knee  2009    Dr Berenice Primas  . Lipoma excision      Right Waist  . Cervical laminectomy  2010    Dr. Carloyn Manner  . Inguinal hernia repair  2003    left  . Colonoscopy w/ polypectomy  2006    negative 2011  . Labrum tear & bone spur surgery  02/2013    Dr Berenice Primas  . Appendectomy  1995  . Abdominal cyst      removed in 1994   Social History   Social History  . Marital Status: Married    Spouse Name: N/A  . Number of Children: N/A  . Years of Education: N/A   Social History Main Topics  . Smoking status: Former Smoker    Types: Cigarettes    Quit date: 12/20/1979  . Smokeless tobacco: Never Used     Comment: smoked 1967-1981, up to 1 ppd  . Alcohol Use: 1.8 - 2.4 oz/week    3-4 Standard drinks or equivalent per week     Comment: occasional beer   . Drug Use: No  . Sexual Activity:    Partners: Female   Other Topics Concern  . Not on file   Social History Narrative   Family History  Problem Relation Age of Onset  . Alcohol abuse Father   . Diabetes Father   . Lung cancer Paternal Grandfather   . Hyperlipidemia Paternal  Grandmother   . Alcohol abuse Mother   . Cirrhosis Mother   . Hyperlipidemia Maternal Grandfather   . Stroke Maternal Grandfather 14  . Breast cancer Sister   . Heart attack Neg Hx    No Known Allergies Prior to Admission medications   Medication Sig Start Date End Date Taking? Authorizing Provider  aspirin 81 MG tablet Take 243 mg by mouth daily.    Yes Historical Provider, MD  b complex vitamins tablet Take 1 tablet by mouth daily.   Yes Historical Provider, MD  Bioflavonoid Products (ESTER C PO) Take 500 mg by mouth daily.   Yes Historical Provider, MD  calcium carbonate (OS-CAL) 600 MG TABS tablet Take 600 mg by mouth daily.   Yes Historical Provider, MD  carvedilol (COREG) 6.25 MG tablet 1/2 bid Patient taking differently: Take 3.125 mg by mouth 2 (two) times daily with a meal.  11/30/15  Yes Hendricks Limes, MD  cholecalciferol (VITAMIN D) 1000 units tablet Take 5,000 Units by mouth 4 (four) times a week.  Yes Historical Provider, MD  Coenzyme Q10 (CO Q 10) 10 MG CAPS Take 10 mg by mouth daily.    Yes Historical Provider, MD  fluticasone (FLONASE) 50 MCG/ACT nasal spray Place 1 spray into the nose 2 (two) times daily. Patient taking differently: Place 1 spray into the nose daily as needed for allergies or rhinitis.  09/26/12  Yes Hendricks Limes, MD  Garlic 123XX123 MG CAPS Take 1,000 mg by mouth daily.    Yes Historical Provider, MD  LECITHIN PO Take 1,200 mg by mouth daily.   Yes Historical Provider, MD  lisinopril-hydrochlorothiazide (PRINZIDE,ZESTORETIC) 20-25 MG per tablet TAKE 1 TABLET EACH DAY. Patient taking differently: Take 1 tablet by mouth daily.  01/23/15  Yes Hendricks Limes, MD  loratadine (CLARITIN) 10 MG tablet Take 10 mg by mouth daily.   Yes Historical Provider, MD  Magnesium 250 MG TABS Take 250 mg by mouth daily.    Yes Historical Provider, MD  montelukast (SINGULAIR) 10 MG tablet Take 10 mg by mouth daily.   Yes Historical Provider, MD  Multiple Vitamins-Minerals  (CENTRUM SILVER PO) Take 1 tablet by mouth daily.    Yes Historical Provider, MD  Multiple Vitamins-Minerals (OCUVITE EYE + MULTI PO) Take 1 tablet by mouth daily.    Yes Historical Provider, MD  Omega 3 1200 MG CAPS Take 1,200 mg by mouth daily.    Yes Historical Provider, MD  Cholecalciferol (VITAMIN D3) 3000 UNITS TABS Take by mouth. Reported on 12/23/2015    Historical Provider, MD     All other systems have been reviewed and were otherwise negative with the exception of those mentioned in the HPI and as above.  Physical Exam: There were no vitals filed for this visit.  General: Alert, no acute distress Cardiovascular: No pedal edema Respiratory: No cyanosis, no use of accessory musculature Skin: No lesions in the area of chief complaint Neurologic: Sensation intact distally Psychiatric: Patient is competent for consent with normal mood and affect Lymphatic: No axillary or cervical lymphadenopathy  MUSCULOSKELETAL: + spurling sign on the left  Assessment/Plan: Radiculopathy, nonunion at C7/T1 Plan for Procedure(s): ANTERIOR CERVICAL DECOMPRESSION/DISCECTOMY FUSION 4 LEVELS (STAGE 1 OF 2), TO BE FOLLOWED BY A PSF C3-T1 TOMORROW FOR STAGE 2   Sinclair Ship, MD 01/05/2016 8:18 AM

## 2016-01-05 NOTE — Anesthesia Preprocedure Evaluation (Addendum)
Anesthesia Evaluation  Patient identified by MRN, date of birth, ID band Patient awake    Reviewed: Allergy & Precautions, H&P , NPO status , Patient's Chart, lab work & pertinent test results, reviewed documented beta blocker date and time   Airway Mallampati: II  TM Distance: >3 FB Neck ROM: Full    Dental no notable dental hx. (+) Teeth Intact, Dental Advisory Given   Pulmonary neg pulmonary ROS, former smoker,    Pulmonary exam normal breath sounds clear to auscultation       Cardiovascular hypertension, Pt. on medications and Pt. on home beta blockers  Rhythm:Regular Rate:Normal     Neuro/Psych negative neurological ROS  negative psych ROS   GI/Hepatic negative GI ROS, Neg liver ROS,   Endo/Other  negative endocrine ROS  Renal/GU negative Renal ROS  negative genitourinary   Musculoskeletal  (+) Arthritis , Osteoarthritis,    Abdominal   Peds  Hematology negative hematology ROS (+)   Anesthesia Other Findings   Reproductive/Obstetrics negative OB ROS                            Anesthesia Physical Anesthesia Plan  ASA: II  Anesthesia Plan: General   Post-op Pain Management:    Induction: Intravenous  Airway Management Planned: Oral ETT  Additional Equipment:   Intra-op Plan:   Post-operative Plan: Extubation in OR  Informed Consent: I have reviewed the patients History and Physical, chart, labs and discussed the procedure including the risks, benefits and alternatives for the proposed anesthesia with the patient or authorized representative who has indicated his/her understanding and acceptance.   Dental advisory given  Plan Discussed with: CRNA  Anesthesia Plan Comments:         Anesthesia Quick Evaluation

## 2016-01-06 ENCOUNTER — Inpatient Hospital Stay (HOSPITAL_COMMUNITY): Payer: No Typology Code available for payment source

## 2016-01-06 ENCOUNTER — Inpatient Hospital Stay (HOSPITAL_COMMUNITY): Payer: No Typology Code available for payment source | Admitting: Anesthesiology

## 2016-01-06 ENCOUNTER — Encounter (HOSPITAL_COMMUNITY)
Admission: RE | Disposition: A | Discharge status: Home / Self Care | Payer: No Typology Code available for payment source | Source: Ambulatory Visit | Attending: Orthopedic Surgery

## 2016-01-06 ENCOUNTER — Encounter (HOSPITAL_COMMUNITY): Payer: Self-pay | Admitting: Anesthesiology

## 2016-01-06 ENCOUNTER — Inpatient Hospital Stay (HOSPITAL_COMMUNITY)
Admission: RE | Admit: 2016-01-06 | Discharge: 2016-01-10 | DRG: 453 | Disposition: A | Discharge status: Home / Self Care | Payer: No Typology Code available for payment source | Source: Ambulatory Visit | Attending: Orthopedic Surgery | Admitting: Orthopedic Surgery

## 2016-01-06 DIAGNOSIS — J95821 Acute postprocedural respiratory failure: Secondary | ICD-10-CM | POA: Diagnosis not present

## 2016-01-06 DIAGNOSIS — Z86010 Personal history of colonic polyps: Secondary | ICD-10-CM

## 2016-01-06 DIAGNOSIS — R609 Edema, unspecified: Secondary | ICD-10-CM | POA: Diagnosis not present

## 2016-01-06 DIAGNOSIS — Z79899 Other long term (current) drug therapy: Secondary | ICD-10-CM

## 2016-01-06 DIAGNOSIS — J810 Acute pulmonary edema: Secondary | ICD-10-CM | POA: Diagnosis not present

## 2016-01-06 DIAGNOSIS — M79602 Pain in left arm: Secondary | ICD-10-CM | POA: Diagnosis present

## 2016-01-06 DIAGNOSIS — Z7982 Long term (current) use of aspirin: Secondary | ICD-10-CM | POA: Diagnosis not present

## 2016-01-06 DIAGNOSIS — K219 Gastro-esophageal reflux disease without esophagitis: Secondary | ICD-10-CM | POA: Diagnosis present

## 2016-01-06 DIAGNOSIS — Z823 Family history of stroke: Secondary | ICD-10-CM | POA: Diagnosis not present

## 2016-01-06 DIAGNOSIS — Z96659 Presence of unspecified artificial knee joint: Secondary | ICD-10-CM | POA: Diagnosis present

## 2016-01-06 DIAGNOSIS — Z419 Encounter for procedure for purposes other than remedying health state, unspecified: Secondary | ICD-10-CM

## 2016-01-06 DIAGNOSIS — R0600 Dyspnea, unspecified: Secondary | ICD-10-CM | POA: Diagnosis not present

## 2016-01-06 DIAGNOSIS — T884XXA Failed or difficult intubation, initial encounter: Secondary | ICD-10-CM | POA: Diagnosis not present

## 2016-01-06 DIAGNOSIS — M960 Pseudarthrosis after fusion or arthrodesis: Secondary | ICD-10-CM | POA: Diagnosis present

## 2016-01-06 DIAGNOSIS — J9600 Acute respiratory failure, unspecified whether with hypoxia or hypercapnia: Secondary | ICD-10-CM | POA: Diagnosis not present

## 2016-01-06 DIAGNOSIS — M5410 Radiculopathy, site unspecified: Secondary | ICD-10-CM | POA: Diagnosis present

## 2016-01-06 DIAGNOSIS — I10 Essential (primary) hypertension: Secondary | ICD-10-CM | POA: Diagnosis present

## 2016-01-06 DIAGNOSIS — M5412 Radiculopathy, cervical region: Secondary | ICD-10-CM | POA: Diagnosis present

## 2016-01-06 DIAGNOSIS — E785 Hyperlipidemia, unspecified: Secondary | ICD-10-CM | POA: Diagnosis present

## 2016-01-06 DIAGNOSIS — J9811 Atelectasis: Secondary | ICD-10-CM | POA: Diagnosis not present

## 2016-01-06 DIAGNOSIS — M4322 Fusion of spine, cervical region: Secondary | ICD-10-CM | POA: Diagnosis not present

## 2016-01-06 DIAGNOSIS — Z87891 Personal history of nicotine dependence: Secondary | ICD-10-CM

## 2016-01-06 DIAGNOSIS — Z4682 Encounter for fitting and adjustment of non-vascular catheter: Secondary | ICD-10-CM | POA: Diagnosis not present

## 2016-01-06 DIAGNOSIS — M542 Cervicalgia: Secondary | ICD-10-CM | POA: Diagnosis present

## 2016-01-06 DIAGNOSIS — Z981 Arthrodesis status: Secondary | ICD-10-CM | POA: Diagnosis not present

## 2016-01-06 DIAGNOSIS — M4802 Spinal stenosis, cervical region: Secondary | ICD-10-CM | POA: Diagnosis present

## 2016-01-06 DIAGNOSIS — R918 Other nonspecific abnormal finding of lung field: Secondary | ICD-10-CM | POA: Diagnosis not present

## 2016-01-06 DIAGNOSIS — J811 Chronic pulmonary edema: Secondary | ICD-10-CM

## 2016-01-06 DIAGNOSIS — J9690 Respiratory failure, unspecified, unspecified whether with hypoxia or hypercapnia: Secondary | ICD-10-CM

## 2016-01-06 HISTORY — DX: Gastro-esophageal reflux disease without esophagitis: K21.9

## 2016-01-06 HISTORY — DX: Failed or difficult intubation, initial encounter: T88.4XXA

## 2016-01-06 HISTORY — PX: CERVICAL FUSION: SHX112

## 2016-01-06 HISTORY — PX: ANTERIOR CERVICAL DECOMPRESSION/DISCECTOMY FUSION 4 LEVELS: SHX5556

## 2016-01-06 HISTORY — DX: Cervical disc disorder, unspecified, unspecified cervical region: M50.90

## 2016-01-06 SURGERY — ANTERIOR CERVICAL DECOMPRESSION/DISCECTOMY FUSION 4 LEVELS
Anesthesia: General

## 2016-01-06 MED ORDER — ACETAMINOPHEN 325 MG PO TABS: 650.0000 mg | ORAL_TABLET | ORAL | Status: DC | PRN

## 2016-01-06 MED ORDER — BUPIVACAINE-EPINEPHRINE 0.25% -1:200000 IJ SOLN
INTRAMUSCULAR | Status: DC | PRN
  Administered 2016-01-06: 4 mL

## 2016-01-06 MED ORDER — CARVEDILOL 3.125 MG PO TABS: 3.1250 mg | ORAL_TABLET | Freq: Two times a day (BID) | ORAL | Status: DC

## 2016-01-06 MED ORDER — CO Q 10 10 MG PO CAPS: 10.0000 mg | ORAL_CAPSULE | Freq: Every day | ORAL | Status: DC

## 2016-01-06 MED ORDER — MONTELUKAST SODIUM 10 MG PO TABS: 10.0000 mg | ORAL_TABLET | Freq: Every day | ORAL | Status: DC

## 2016-01-06 MED ORDER — LECITHIN 1200 MG PO CAPS: ORAL_CAPSULE | Freq: Every day | ORAL | Status: DC

## 2016-01-06 MED ORDER — LIDOCAINE HCL (CARDIAC) 20 MG/ML IV SOLN
INTRAVENOUS | Status: AC
  Filled 2016-01-06: qty 5

## 2016-01-06 MED ORDER — SODIUM CHLORIDE 0.9 % IV SOLN: 250.0000 mL | INTRAVENOUS | Status: DC

## 2016-01-06 MED ORDER — DOCUSATE SODIUM 100 MG PO CAPS
100.0000 mg | ORAL_CAPSULE | Freq: Two times a day (BID) | ORAL | Status: DC
  Administered 2016-01-06 – 2016-01-10 (×2): 100 mg via ORAL
  Filled 2016-01-06 (×3): qty 1

## 2016-01-06 MED ORDER — VITAMIN D 1000 UNITS PO TABS
5000.0000 [IU] | ORAL_TABLET | ORAL | Status: DC
  Administered 2016-01-10: 5000 [IU] via ORAL
  Filled 2016-01-06 (×2): qty 5

## 2016-01-06 MED ORDER — LISINOPRIL-HYDROCHLOROTHIAZIDE 20-25 MG PO TABS: 1.0000 | ORAL_TABLET | Freq: Every day | ORAL | Status: DC

## 2016-01-06 MED ORDER — PROPOFOL 10 MG/ML IV BOLUS
INTRAVENOUS | Status: DC | PRN
  Administered 2016-01-06: 130 mg via INTRAVENOUS

## 2016-01-06 MED ORDER — LACTATED RINGERS IV SOLN
INTRAVENOUS | Status: DC | PRN
  Administered 2016-01-06 (×2): via INTRAVENOUS

## 2016-01-06 MED ORDER — HYDROMORPHONE HCL 1 MG/ML IJ SOLN
INTRAMUSCULAR | Status: AC
  Administered 2016-01-06: 0.25 mg via INTRAVENOUS
  Filled 2016-01-06: qty 1

## 2016-01-06 MED ORDER — SODIUM CHLORIDE 0.9 % IJ SOLN: 3.0000 mL | INTRAMUSCULAR | Status: DC | PRN

## 2016-01-06 MED ORDER — ROCURONIUM BROMIDE 50 MG/5ML IV SOLN
INTRAVENOUS | Status: AC
  Filled 2016-01-06: qty 1

## 2016-01-06 MED ORDER — MENTHOL 3 MG MT LOZG
1.0000 | LOZENGE | OROMUCOSAL | Status: DC | PRN
  Administered 2016-01-07: 3 mg via ORAL
  Filled 2016-01-06: qty 9

## 2016-01-06 MED ORDER — VITAMIN D 1000 UNITS PO TABS: 1000.0000 [IU] | ORAL_TABLET | Freq: Every day | ORAL | Status: AC

## 2016-01-06 MED ORDER — LISINOPRIL 20 MG PO TABS
20.0000 mg | ORAL_TABLET | Freq: Every day | ORAL | Status: DC
  Administered 2016-01-06: 20 mg via ORAL
  Filled 2016-01-06: qty 1

## 2016-01-06 MED ORDER — BISACODYL 5 MG PO TBEC
5.0000 mg | DELAYED_RELEASE_TABLET | Freq: Every day | ORAL | Status: DC | PRN
Start: 2016-01-06 — End: 2016-01-07

## 2016-01-06 MED ORDER — GLYCOPYRROLATE 0.2 MG/ML IJ SOLN
INTRAMUSCULAR | Status: DC | PRN
  Administered 2016-01-06: 0.2 mg via INTRAVENOUS

## 2016-01-06 MED ORDER — LIDOCAINE HCL (CARDIAC) 20 MG/ML IV SOLN
INTRAVENOUS | Status: DC | PRN
  Administered 2016-01-06: 50 mg via INTRAVENOUS

## 2016-01-06 MED ORDER — CEFAZOLIN SODIUM-DEXTROSE 2-3 GM-% IV SOLR
2.0000 g | INTRAVENOUS | Status: AC
  Administered 2016-01-06 (×2): 2 g via INTRAVENOUS

## 2016-01-06 MED ORDER — ACETAMINOPHEN 650 MG RE SUPP: 650.0000 mg | RECTAL | Status: DC | PRN

## 2016-01-06 MED ORDER — MIDAZOLAM HCL 2 MG/2ML IJ SOLN
INTRAMUSCULAR | Status: AC
  Filled 2016-01-06: qty 2

## 2016-01-06 MED ORDER — PHENOL 1.4 % MT LIQD: 1.0000 | OROMUCOSAL | Status: DC | PRN

## 2016-01-06 MED ORDER — ONDANSETRON HCL 4 MG/2ML IJ SOLN: 4.0000 mg | INTRAMUSCULAR | Status: DC | PRN

## 2016-01-06 MED ORDER — FENTANYL CITRATE (PF) 250 MCG/5ML IJ SOLN
INTRAMUSCULAR | Status: AC
  Filled 2016-01-06: qty 5

## 2016-01-06 MED ORDER — B COMPLEX-C PO TABS
1.0000 | ORAL_TABLET | Freq: Every day | ORAL | Status: DC
  Filled 2016-01-06: qty 1

## 2016-01-06 MED ORDER — SENNOSIDES-DOCUSATE SODIUM 8.6-50 MG PO TABS: 1.0000 | ORAL_TABLET | Freq: Every evening | ORAL | Status: DC | PRN

## 2016-01-06 MED ORDER — B COMPLEX PO TABS: 1.0000 | ORAL_TABLET | Freq: Every day | ORAL | Status: DC

## 2016-01-06 MED ORDER — HYDROMORPHONE HCL 1 MG/ML IJ SOLN
INTRAMUSCULAR | Status: AC
  Filled 2016-01-06: qty 1

## 2016-01-06 MED ORDER — FLUTICASONE PROPIONATE 50 MCG/ACT NA SUSP
1.0000 | Freq: Every day | NASAL | Status: DC
  Filled 2016-01-06: qty 16

## 2016-01-06 MED ORDER — ROCURONIUM BROMIDE 100 MG/10ML IV SOLN
INTRAVENOUS | Status: DC | PRN
  Administered 2016-01-06: 10 mg via INTRAVENOUS
  Administered 2016-01-06: 30 mg via INTRAVENOUS
  Administered 2016-01-06: 10 mg via INTRAVENOUS
  Administered 2016-01-06: 50 mg via INTRAVENOUS

## 2016-01-06 MED ORDER — ONDANSETRON HCL 4 MG/2ML IJ SOLN
INTRAMUSCULAR | Status: DC | PRN
  Administered 2016-01-06: 4 mg via INTRAVENOUS

## 2016-01-06 MED ORDER — DIAZEPAM 5 MG PO TABS
5.0000 mg | ORAL_TABLET | Freq: Four times a day (QID) | ORAL | Status: DC | PRN
  Administered 2016-01-06 – 2016-01-07 (×3): 5 mg via ORAL
  Filled 2016-01-06 (×2): qty 1

## 2016-01-06 MED ORDER — PHENYLEPHRINE HCL 10 MG/ML IJ SOLN
INTRAMUSCULAR | Status: DC | PRN
  Administered 2016-01-06: 40 ug via INTRAVENOUS
  Administered 2016-01-06 (×2): 80 ug via INTRAVENOUS

## 2016-01-06 MED ORDER — ZOLPIDEM TARTRATE 5 MG PO TABS: 5.0000 mg | ORAL_TABLET | Freq: Every evening | ORAL | Status: DC | PRN

## 2016-01-06 MED ORDER — THROMBIN 20000 UNITS EX SOLR
CUTANEOUS | Status: AC
  Filled 2016-01-06: qty 20000

## 2016-01-06 MED ORDER — PROPOFOL 10 MG/ML IV BOLUS
INTRAVENOUS | Status: AC
  Filled 2016-01-06: qty 20

## 2016-01-06 MED ORDER — MIDAZOLAM HCL 5 MG/5ML IJ SOLN
INTRAMUSCULAR | Status: DC | PRN
  Administered 2016-01-06: 2 mg via INTRAVENOUS

## 2016-01-06 MED ORDER — THROMBIN 20000 UNITS EX KIT
PACK | CUTANEOUS | Status: DC | PRN
  Administered 2016-01-06: 20000 [IU] via TOPICAL

## 2016-01-06 MED ORDER — CEFAZOLIN SODIUM-DEXTROSE 2-3 GM-% IV SOLR
INTRAVENOUS | Status: AC
  Filled 2016-01-06: qty 50

## 2016-01-06 MED ORDER — FLEET ENEMA 7-19 GM/118ML RE ENEM: 1.0000 | ENEMA | Freq: Once | RECTAL | Status: DC | PRN

## 2016-01-06 MED ORDER — DIAZEPAM 5 MG PO TABS
ORAL_TABLET | ORAL | Status: AC
  Filled 2016-01-06: qty 1

## 2016-01-06 MED ORDER — HYDROMORPHONE HCL 1 MG/ML IJ SOLN
0.5000 mg | INTRAMUSCULAR | Status: DC | PRN
  Administered 2016-01-07 (×2): 1 mg via INTRAVENOUS
  Filled 2016-01-06 (×2): qty 1

## 2016-01-06 MED ORDER — HYDROMORPHONE HCL 1 MG/ML IJ SOLN
0.2500 mg | INTRAMUSCULAR | Status: DC | PRN
  Administered 2016-01-06: 0.5 mg via INTRAVENOUS
  Administered 2016-01-06 (×3): 0.25 mg via INTRAVENOUS

## 2016-01-06 MED ORDER — SUGAMMADEX SODIUM 200 MG/2ML IV SOLN
INTRAVENOUS | Status: DC | PRN
  Administered 2016-01-06: 180 mg via INTRAVENOUS

## 2016-01-06 MED ORDER — HYDROCHLOROTHIAZIDE 25 MG PO TABS
25.0000 mg | ORAL_TABLET | Freq: Every day | ORAL | Status: DC
  Administered 2016-01-06: 25 mg via ORAL
  Filled 2016-01-06: qty 1

## 2016-01-06 MED ORDER — 0.9 % SODIUM CHLORIDE (POUR BTL) OPTIME
TOPICAL | Status: DC | PRN
  Administered 2016-01-06: 1000 mL

## 2016-01-06 MED ORDER — BUPIVACAINE-EPINEPHRINE (PF) 0.25% -1:200000 IJ SOLN
INTRAMUSCULAR | Status: AC
  Filled 2016-01-06: qty 30

## 2016-01-06 MED ORDER — GLYCOPYRROLATE 0.2 MG/ML IJ SOLN
INTRAMUSCULAR | Status: AC
  Filled 2016-01-06: qty 1

## 2016-01-06 MED ORDER — ADULT MULTIVITAMIN W/MINERALS CH
1.0000 | ORAL_TABLET | Freq: Every day | ORAL | Status: DC
  Administered 2016-01-10: 1 via ORAL
  Filled 2016-01-06: qty 1

## 2016-01-06 MED ORDER — POVIDONE-IODINE 7.5 % EX SOLN
Freq: Once | CUTANEOUS | Status: DC
  Filled 2016-01-06: qty 118

## 2016-01-06 MED ORDER — EPHEDRINE SULFATE 50 MG/ML IJ SOLN
INTRAMUSCULAR | Status: DC | PRN
  Administered 2016-01-06 (×2): 20 mg via INTRAVENOUS

## 2016-01-06 MED ORDER — ALUM & MAG HYDROXIDE-SIMETH 200-200-20 MG/5ML PO SUSP: 30.0000 mL | Freq: Four times a day (QID) | ORAL | Status: DC | PRN

## 2016-01-06 MED ORDER — LORATADINE 10 MG PO TABS
10.0000 mg | ORAL_TABLET | Freq: Every day | ORAL | Status: DC
  Administered 2016-01-06 – 2016-01-10 (×2): 10 mg via ORAL
  Filled 2016-01-06 (×2): qty 1

## 2016-01-06 MED ORDER — EPHEDRINE SULFATE 50 MG/ML IJ SOLN
INTRAMUSCULAR | Status: AC
  Filled 2016-01-06: qty 1

## 2016-01-06 MED ORDER — PHENYLEPHRINE 40 MCG/ML (10ML) SYRINGE FOR IV PUSH (FOR BLOOD PRESSURE SUPPORT)
PREFILLED_SYRINGE | INTRAVENOUS | Status: AC
  Filled 2016-01-06: qty 10

## 2016-01-06 MED ORDER — SUGAMMADEX SODIUM 200 MG/2ML IV SOLN
INTRAVENOUS | Status: AC
  Filled 2016-01-06: qty 2

## 2016-01-06 MED ORDER — CALCIUM CARBONATE 1250 (500 CA) MG PO TABS: 1250.0000 mg | ORAL_TABLET | Freq: Every day | ORAL | Status: DC

## 2016-01-06 MED ORDER — FENTANYL CITRATE (PF) 100 MCG/2ML IJ SOLN
INTRAMUSCULAR | Status: DC | PRN
  Administered 2016-01-06 (×7): 50 ug via INTRAVENOUS

## 2016-01-06 MED ORDER — CEFAZOLIN SODIUM 1-5 GM-% IV SOLN
1.0000 g | Freq: Three times a day (TID) | INTRAVENOUS | Status: AC
  Administered 2016-01-06 – 2016-01-07 (×2): 1 g via INTRAVENOUS
  Filled 2016-01-06 (×2): qty 50

## 2016-01-06 MED ORDER — ONDANSETRON HCL 4 MG/2ML IJ SOLN
INTRAMUSCULAR | Status: AC
  Filled 2016-01-06: qty 2

## 2016-01-06 MED ORDER — HYDROMORPHONE HCL 2 MG PO TABS
1.0000 mg | ORAL_TABLET | ORAL | Status: DC | PRN
  Administered 2016-01-06: 2 mg via ORAL
  Filled 2016-01-06: qty 1

## 2016-01-06 SURGICAL SUPPLY — 83 items
APL SKNCLS STERI-STRIP NONHPOA (GAUZE/BANDAGES/DRESSINGS) ×1
BENZOIN TINCTURE PRP APPL 2/3 (GAUZE/BANDAGES/DRESSINGS) ×3 IMPLANT
BIT DRILL NEURO 2X3.1 SFT TUCH (MISCELLANEOUS) ×1 IMPLANT
BIT DRILL SRG 14X2.2XFLT CHK (BIT) ×1 IMPLANT
BIT DRL SRG 14X2.2XFLT CHK (BIT) ×1
BLADE SURG 15 STRL LF DISP TIS (BLADE) ×1 IMPLANT
BLADE SURG 15 STRL SS (BLADE) ×3
BLADE SURG ROTATE 9660 (MISCELLANEOUS) ×3 IMPLANT
BUR MATCHSTICK NEURO 3.0 LAGG (BURR) ×3 IMPLANT
CARTRIDGE OIL MAESTRO DRILL (MISCELLANEOUS) ×1 IMPLANT
CLOSURE WOUND 1/2 X4 (GAUZE/BANDAGES/DRESSINGS) ×1
COLLAR CERV LO CONTOUR FIRM DE (SOFTGOODS) ×3 IMPLANT
CORDS BIPOLAR (ELECTRODE) ×3 IMPLANT
COVER SURGICAL LIGHT HANDLE (MISCELLANEOUS) ×3 IMPLANT
CRADLE DONUT ADULT HEAD (MISCELLANEOUS) ×3 IMPLANT
DECANTER SPIKE VIAL GLASS SM (MISCELLANEOUS) IMPLANT
DEVICE ENDSKLTN CRVCL 5MM-0SM (Orthopedic Implant) ×1 IMPLANT
DEVICE ENDSKLTN IMPLNT MED 6MM (Orthopedic Implant) ×1 IMPLANT
DIFFUSER DRILL AIR PNEUMATIC (MISCELLANEOUS) ×3 IMPLANT
DRAIN JACKSON RD 7FR 3/32 (WOUND CARE) IMPLANT
DRAPE C-ARM 42X72 X-RAY (DRAPES) ×3 IMPLANT
DRAPE POUCH INSTRU U-SHP 10X18 (DRAPES) ×3 IMPLANT
DRAPE SURG 17X23 STRL (DRAPES) ×9 IMPLANT
DRILL BIT SKYLINE 14MM (BIT) ×3
DRILL NEURO 2X3.1 SOFT TOUCH (MISCELLANEOUS) ×3
DURAPREP 26ML APPLICATOR (WOUND CARE) ×3 IMPLANT
ELECT COATED BLADE 2.86 ST (ELECTRODE) ×3 IMPLANT
ELECT REM PT RETURN 9FT ADLT (ELECTROSURGICAL) ×3
ELECTRODE REM PT RTRN 9FT ADLT (ELECTROSURGICAL) ×1 IMPLANT
ENDOSKELETON CERVICAL 5MM-0SM (Orthopedic Implant) ×3 IMPLANT
ENDOSKELETON IMPLANT MED 6MM (Orthopedic Implant) ×3 IMPLANT
EVACUATOR SILICONE 100CC (DRAIN) IMPLANT
GAUZE SPONGE 4X4 12PLY STRL (GAUZE/BANDAGES/DRESSINGS) ×3 IMPLANT
GAUZE SPONGE 4X4 16PLY XRAY LF (GAUZE/BANDAGES/DRESSINGS) ×3 IMPLANT
GLOVE BIO SURGEON STRL SZ7 (GLOVE) ×6 IMPLANT
GLOVE BIO SURGEON STRL SZ8 (GLOVE) ×6 IMPLANT
GLOVE BIOGEL PI IND STRL 7.0 (GLOVE) ×2 IMPLANT
GLOVE BIOGEL PI IND STRL 8 (GLOVE) ×2 IMPLANT
GLOVE BIOGEL PI INDICATOR 7.0 (GLOVE) ×4
GLOVE BIOGEL PI INDICATOR 8 (GLOVE) ×4
GOWN STRL REUS W/ TWL LRG LVL3 (GOWN DISPOSABLE) ×2 IMPLANT
GOWN STRL REUS W/ TWL XL LVL3 (GOWN DISPOSABLE) ×2 IMPLANT
GOWN STRL REUS W/TWL LRG LVL3 (GOWN DISPOSABLE) ×6
GOWN STRL REUS W/TWL XL LVL3 (GOWN DISPOSABLE) ×6
INTERLOCK LRDTC CRVCL VBR 6MM (Bone Implant) ×1 IMPLANT
INTERLOCK LRDTC CRVCL VBR SM (Bone Implant) ×1 IMPLANT
IV CATH 14GX2 1/4 (CATHETERS) ×3 IMPLANT
KIT BASIN OR (CUSTOM PROCEDURE TRAY) ×3 IMPLANT
KIT ROOM TURNOVER OR (KITS) ×3 IMPLANT
LORDOTIC CERVICAL VBR 6MM SM (Bone Implant) ×3 IMPLANT
LORDOTIC CERVICAL VBR SM 5MM (Bone Implant) ×3 IMPLANT
MANIFOLD NEPTUNE II (INSTRUMENTS) ×3 IMPLANT
MARKER SKIN DUAL TIP RULER LAB (MISCELLANEOUS) ×3 IMPLANT
NEEDLE 27GAX1X1/2 (NEEDLE) ×3 IMPLANT
NEEDLE SPNL 20GX3.5 QUINCKE YW (NEEDLE) ×3 IMPLANT
NS IRRIG 1000ML POUR BTL (IV SOLUTION) ×3 IMPLANT
OIL CARTRIDGE MAESTRO DRILL (MISCELLANEOUS) ×3
PACK ORTHO CERVICAL (CUSTOM PROCEDURE TRAY) ×3 IMPLANT
PAD ARMBOARD 7.5X6 YLW CONV (MISCELLANEOUS) ×6 IMPLANT
PATTIES SURGICAL .5 X.5 (GAUZE/BANDAGES/DRESSINGS) IMPLANT
PATTIES SURGICAL .5 X1 (DISPOSABLE) IMPLANT
PIN DISTRACTION 14 (PIN) ×6 IMPLANT
PIN TEMP SKYLINE THREADED (PIN) ×3 IMPLANT
PLATE FOUR LEVEL 68MM (Plate) ×3 IMPLANT
PUTTY BONE DBX 5CC MIX (Putty) ×3 IMPLANT
SCREW SKYLINE VAR OS 14MM (Screw) ×24 IMPLANT
SCREW VAR SELF TAP SKYLINE 14M (Screw) ×6 IMPLANT
SPONGE INTESTINAL PEANUT (DISPOSABLE) ×6 IMPLANT
SPONGE SURGIFOAM ABS GEL 100 (HEMOSTASIS) ×3 IMPLANT
STRIP CLOSURE SKIN 1/2X4 (GAUZE/BANDAGES/DRESSINGS) ×2 IMPLANT
SURGIFLO W/THROMBIN 8M KIT (HEMOSTASIS) IMPLANT
SUT MNCRL AB 4-0 PS2 18 (SUTURE) ×3 IMPLANT
SUT VIC AB 2-0 CT2 18 VCP726D (SUTURE) ×3 IMPLANT
SYR BULB IRRIGATION 50ML (SYRINGE) ×3 IMPLANT
SYR CONTROL 10ML LL (SYRINGE) ×6 IMPLANT
TAPE CLOTH 4X10 WHT NS (GAUZE/BANDAGES/DRESSINGS) ×3 IMPLANT
TAPE UMBILICAL COTTON 1/8X30 (MISCELLANEOUS) ×3 IMPLANT
TOWEL OR 17X24 6PK STRL BLUE (TOWEL DISPOSABLE) ×3 IMPLANT
TOWEL OR 17X26 10 PK STRL BLUE (TOWEL DISPOSABLE) ×3 IMPLANT
TRAY FOLEY CATH 16FRSI W/METER (SET/KITS/TRAYS/PACK) IMPLANT
TRAY FOLEY W/METER SILVER 16FR (SET/KITS/TRAYS/PACK) ×3 IMPLANT
WATER STERILE IRR 1000ML POUR (IV SOLUTION) ×3 IMPLANT
YANKAUER SUCT BULB TIP NO VENT (SUCTIONS) ×6 IMPLANT

## 2016-01-06 NOTE — Anesthesia Preprocedure Evaluation (Addendum)
Anesthesia Evaluation  Patient identified by MRN, date of birth, ID band Patient awake    Reviewed: Allergy & Precautions, H&P , NPO status , Patient's Chart, lab work & pertinent test results, reviewed documented beta blocker date and time   Airway Mallampati: III  TM Distance: >3 FB Neck ROM: Limited    Dental no notable dental hx. (+) Teeth Intact, Dental Advisory Given   Pulmonary neg pulmonary ROS, former smoker,    Pulmonary exam normal breath sounds clear to auscultation       Cardiovascular hypertension, Pt. on medications and Pt. on home beta blockers  Rhythm:Regular Rate:Normal  Coreg was held this morning b/c NPO and order says "with meals".  (!)   Neuro/Psych negative neurological ROS  negative psych ROS   GI/Hepatic Neg liver ROS, GERD  Medicated and Controlled,  Endo/Other  negative endocrine ROS  Renal/GU negative Renal ROS  negative genitourinary   Musculoskeletal  (+) Arthritis , Osteoarthritis,    Abdominal   Peds  Hematology negative hematology ROS (+)   Anesthesia Other Findings Cervical Collar in place  Reproductive/Obstetrics negative OB ROS                          Anesthesia Physical Anesthesia Plan  ASA: II  Anesthesia Plan: General   Post-op Pain Management:    Induction: Intravenous  Airway Management Planned: Oral ETT and Video Laryngoscope Planned  Additional Equipment:   Intra-op Plan:   Post-operative Plan: Extubation in OR  Informed Consent: I have reviewed the patients History and Physical, chart, labs and discussed the procedure including the risks, benefits and alternatives for the proposed anesthesia with the patient or authorized representative who has indicated his/her understanding and acceptance.   Dental advisory given  Plan Discussed with: CRNA  Anesthesia Plan Comments:        Anesthesia Quick Evaluation

## 2016-01-06 NOTE — Op Note (Signed)
NAME:  Ryan Cobb, Ryan Cobb NO.:  [id number]  MEDICAL RECORD NO.:  [id number]  LOCATION:  [facility]                         FACILITY:  [facility]  PHYSICIAN:  Phylliss Bob, MD      DATE OF BIRTH:  [birth year]  DATE OF PROCEDURE:  01/06/2016                              OPERATIVE REPORT   PREOPERATIVE DIAGNOSES: 1. Left-sided cervical radiculopathy. 2. Multilevel left-sided neural foraminal stenosis, C3-C7. 3. Status post previous C7-T1 fusion that did go onto a nonunion (this     was performed in 2010 by another surgeon).  POSTOPERATIVE DIAGNOSES: 1. Left-sided cervical radiculopathy. 2. Multilevel left-sided neural foraminal stenosis, C3-C7. 3. Status post previous C7-T1 fusion that did go onto a nonunion (this     was performed in 2010 by another surgeon).  PROCEDURE (STAGE 1 OF 2): 1. Anterior cervical decompression and fusion C3-4, C4-5, C5-6, and C6-7. 1. Placement of anterior instrumentation, C3-C7. 2. Insertion of interbody device x4 (Titan intervertebral spacers). 3. Intraoperative use of fluoroscopy. 4. Use of morselized allograft-DBX mix.  SURGEON:  Phylliss Bob, MD.  ASSISTANTPricilla Holm, PA-C.  ANESTHESIA:  General endotracheal anesthesia.  COMPLICATIONS:  None.  DISPOSITION:  Stable.  ESTIMATED BLOOD LOSS:  Minimal.  INDICATIONS FOR SURGERY:  Briefly, Ryan Cobb is a pleasant 65 year old male, who did present with ongoing pain in the neck and left arm in addition to pain in the periscapular region on the left side.  The patient is status post a previous attempted fusion at C7-T1.  A CAT scan did reveal a nonunion at that level.  An MRI did reveal multilevel neural foraminal stenosis at the levels noted above.  The patient failed appropriate conservative care, and did wish to proceed with surgery.  Of note, given the multilevel cervical radiculopathy, we did discuss proceeding with a four-level ACDF on stage I, to be followed in a  staged fashion on the following day with a posterior fusion from C3-T1, given nonunion.  The patient did elect to proceed.  OPERATIVE DETAILS:  On January 06, 2016, the patient was brought to surgery and general endotracheal anesthesia was administered.  The patient was placed supine on the hospital bed.  The neck was gently extended.  The patient's arms were secured to the sides.  A Foley catheter was placed.  The neck was then prepped and draped and a left- sided oblique incision was made anterior to the sternocleidomastoid muscle.  The platysma was incised.  A Smith-Robinson approach was used. The anterior spine was noted.  I then subperiosteally exposed the vertebral bodies from C3-C7.  The previously placed anterior cervical plate was identified, but did not inhibit my access to the C6-7 intervertebral space.  Osteophytes were removed anteriorly.  I then placed a self-retaining retractor.  Caspar pins were placed into the C6 and C7 vertebral bodies and distraction was applied.  I then proceeded with a thorough and complete intervertebral diskectomy.  The central spinal canal and the bilateral neural foramina were decompressed.  The endplates were prepared, and the appropriate size interbody spacer was packed with DBX mix and tamped into position in the usual fashion.  The lower Caspar pin was removed, and the bone  wax was placed in its place. In the manner just described, a similar diskectomy and decompression was performed at the C5-6 level, and subsequently the C4-5 level, and subsequent to that, the C3-4 level.  At each level, distraction was applied, and a thorough diskectomy and decompression was performed, and the endplates were prepared, and the appropriate size interbody spacer was packed with DBX mix and tamped into the intervertebral space. Again, this was done at C3-4, C4-5, C5-6, as well as C6-7.  Bone wax was placed in the Caspar pin holes.  The appropriate sized  anterior cervical plate was placed over the anterior spine.  A 14-mm variable angle screws were placed, 2 in each vertebral body from C3-C7 for a total of 10 vertebral body screws.  The screws were then locked to the plate using the CAM locking mechanism.  I was very pleased with the press-fit of each of the screws.  I was very pleased with the final AP and lateral fluoroscopic images.  The wound was then copiously irrigated.  The wound was then closed in layers.  The platysma was closed using 2-0 Vicryl, and the skin was closed using 3-0 Monocryl.  Benzoin and Steri-Strips were applied followed by sterile dressing.  All instrument counts were correct at the termination of the procedure.  Of note, Pricilla Holm was my assistant throughout surgery, and did aid in retraction, suctioning, and closure from start to finish.     Phylliss Bob, MD     MD/MEDQ  D:  01/06/2016  T:  01/06/2016  Job:  [id number]  cc:   Darrick Penna. Linna Darner, MD,FACP,FCCP

## 2016-01-06 NOTE — Transfer of Care (Signed)
Immediate Anesthesia Transfer of Care Note  Patient: Ryan Cobb  Procedure(s) Performed: Procedure(s) with comments: ANTERIOR CERVICAL DECOMPRESSION/DISCECTOMY FUSION 4 LEVELS (N/A) - Anterior cerivcal decompression fusion, cervical 3-4, cervical 4-5, cervical 5-6, cervical 6-7 with instrumentation and allograft  Patient Location: PACU  Anesthesia Type:General  Level of Consciousness: awake  Airway & Oxygen Therapy: Patient Spontanous Breathing and Patient connected to nasal cannula oxygen  Post-op Assessment: Report given to RN and Post -op Vital signs reviewed and stable  Post vital signs: Reviewed and stable  Last Vitals:  Filed Vitals:   01/06/16 0659 01/06/16 1240  BP: 142/76   Pulse: 51   Temp: 36.5 C 36.4 C  Resp: 18     Complications: No apparent anesthesia complications

## 2016-01-06 NOTE — Anesthesia Procedure Notes (Signed)
Procedure Name: Intubation Date/Time: 01/06/2016 8:26 AM Performed by: Manus Gunning, Dossie Swor J Pre-anesthesia Checklist: Patient identified, Emergency Drugs available, Suction available, Patient being monitored and Timeout performed Patient Re-evaluated:Patient Re-evaluated prior to inductionOxygen Delivery Method: Circle system utilized Preoxygenation: Pre-oxygenation with 100% oxygen Intubation Type: IV induction Ventilation: Mask ventilation without difficulty Laryngoscope Size: Mac and 4 Grade View: Grade I Tube type: Oral Tube size: 7.5 mm Number of attempts: 1 Placement Confirmation: ETT inserted through vocal cords under direct vision,  positive ETCO2 and breath sounds checked- equal and bilateral Secured at: 21 cm Tube secured with: Tape Dental Injury: Teeth and Oropharynx as per pre-operative assessment

## 2016-01-06 NOTE — Anesthesia Postprocedure Evaluation (Signed)
Anesthesia Post Note  Patient: ESLIE ORRELL  Procedure(s) Performed: Procedure(s) (LRB): ANTERIOR CERVICAL DECOMPRESSION/DISCECTOMY FUSION 4 LEVELS (N/A)  Patient location during evaluation: PACU Anesthesia Type: General Level of consciousness: awake and alert Pain management: pain level controlled Vital Signs Assessment: post-procedure vital signs reviewed and stable Respiratory status: spontaneous breathing, nonlabored ventilation, respiratory function stable and patient connected to nasal cannula oxygen Cardiovascular status: blood pressure returned to baseline and stable Postop Assessment: no signs of nausea or vomiting Anesthetic complications: no    Last Vitals:  Filed Vitals:   01/06/16 1255 01/06/16 1315  BP: 162/89 159/89  Pulse: 74   Temp:    Resp: 7     Last Pain:  Filed Vitals:   01/06/16 1325  PainSc: 6                  Demont Linford,W. EDMOND

## 2016-01-07 ENCOUNTER — Inpatient Hospital Stay (HOSPITAL_COMMUNITY): Payer: No Typology Code available for payment source | Admitting: Anesthesiology

## 2016-01-07 ENCOUNTER — Inpatient Hospital Stay (HOSPITAL_COMMUNITY)
Admission: RE | Admit: 2016-01-07 | Payer: PRIVATE HEALTH INSURANCE | Source: Ambulatory Visit | Admitting: Orthopedic Surgery

## 2016-01-07 ENCOUNTER — Inpatient Hospital Stay (HOSPITAL_COMMUNITY): Payer: No Typology Code available for payment source

## 2016-01-07 ENCOUNTER — Encounter (HOSPITAL_COMMUNITY)
Admission: RE | Disposition: A | Discharge status: Home / Self Care | Payer: No Typology Code available for payment source | Source: Ambulatory Visit | Attending: Orthopedic Surgery

## 2016-01-07 ENCOUNTER — Encounter (HOSPITAL_COMMUNITY): Payer: Self-pay | Admitting: Anesthesiology

## 2016-01-07 DIAGNOSIS — J9600 Acute respiratory failure, unspecified whether with hypoxia or hypercapnia: Secondary | ICD-10-CM

## 2016-01-07 DIAGNOSIS — J9690 Respiratory failure, unspecified, unspecified whether with hypoxia or hypercapnia: Secondary | ICD-10-CM | POA: Insufficient documentation

## 2016-01-07 DIAGNOSIS — M5410 Radiculopathy, site unspecified: Secondary | ICD-10-CM

## 2016-01-07 DIAGNOSIS — Z419 Encounter for procedure for purposes other than remedying health state, unspecified: Secondary | ICD-10-CM | POA: Insufficient documentation

## 2016-01-07 DIAGNOSIS — T884XXA Failed or difficult intubation, initial encounter: Secondary | ICD-10-CM | POA: Insufficient documentation

## 2016-01-07 HISTORY — PX: POSTERIOR CERVICAL FUSION/FORAMINOTOMY: SHX5038

## 2016-01-07 LAB — TROPONIN I
TROPONIN I: 0.03 ng/mL (ref <0.031)
TROPONIN I: 0.05 ng/mL — AB (ref <0.031)

## 2016-01-07 LAB — CBC
HEMATOCRIT: 36.4 % — AB (ref 39.0–52.0)
HEMOGLOBIN: 12.6 g/dL — AB (ref 13.0–17.0)
MCH: 31 pg (ref 26.0–34.0)
MCHC: 34.6 g/dL (ref 30.0–36.0)
MCV: 89.7 fL (ref 78.0–100.0)
Platelets: 131 10*3/uL — ABNORMAL LOW (ref 150–400)
RBC: 4.06 MIL/uL — ABNORMAL LOW (ref 4.22–5.81)
RDW: 13.1 % (ref 11.5–15.5)
WBC: 17.6 10*3/uL — ABNORMAL HIGH (ref 4.0–10.5)

## 2016-01-07 LAB — BASIC METABOLIC PANEL
Anion gap: 8 (ref 5–15)
Anion gap: 9 (ref 5–15)
BUN: 9 mg/dL (ref 6–20)
BUN: 6 mg/dL (ref 6–20)
CALCIUM: 7.9 mg/dL — AB (ref 8.9–10.3)
CO2: 27 mmol/L (ref 22–32)
CO2: 30 mmol/L (ref 22–32)
CREATININE: 0.72 mg/dL (ref 0.61–1.24)
CREATININE: 0.8 mg/dL (ref 0.61–1.24)
Calcium: 8.5 mg/dL — ABNORMAL LOW (ref 8.9–10.3)
Chloride: 98 mmol/L — ABNORMAL LOW (ref 101–111)
Chloride: 98 mmol/L — ABNORMAL LOW (ref 101–111)
GFR calc non Af Amer: >60 mL/min (ref >60)
Glucose, Bld: 147 mg/dL — ABNORMAL HIGH (ref 65–99)
Glucose, Bld: 122 mg/dL — ABNORMAL HIGH (ref 65–99)
POTASSIUM: 3.4 mmol/L — AB (ref 3.5–5.1)
Potassium: 5.7 mmol/L — ABNORMAL HIGH (ref 3.5–5.1)
SODIUM: 133 mmol/L — AB (ref 135–145)
SODIUM: 137 mmol/L (ref 135–145)

## 2016-01-07 LAB — BLOOD GAS, ARTERIAL
Acid-Base Excess: 3.6 mmol/L — ABNORMAL HIGH (ref 0.0–2.0)
Bicarbonate: 27.9 mEq/L — ABNORMAL HIGH (ref 20.0–24.0)
DRAWN BY: 280981
FIO2: 0.5
MECHVT: 530 mL
O2 SAT: 94.4 %
PATIENT TEMPERATURE: 98.6
PCO2 ART: 44.7 mmHg (ref 35.0–45.0)
PEEP: 5 cmH2O
PH ART: 7.413 (ref 7.350–7.450)
PO2 ART: 73.8 mmHg — AB (ref 80.0–100.0)
RATE: 14 resp/min
TCO2: 29.3 mmol/L (ref 0–100)

## 2016-01-07 LAB — TRIGLYCERIDES: TRIGLYCERIDES: 70 mg/dL (ref <150)

## 2016-01-07 LAB — GLUCOSE, CAPILLARY
GLUCOSE-CAPILLARY: 135 mg/dL — AB (ref 65–99)
Glucose-Capillary: 113 mg/dL — ABNORMAL HIGH (ref 65–99)

## 2016-01-07 SURGERY — POSTERIOR CERVICAL FUSION/FORAMINOTOMY LEVEL 5
Anesthesia: General | Site: Spine Cervical

## 2016-01-07 MED ORDER — SUCCINYLCHOLINE CHLORIDE 20 MG/ML IJ SOLN
INTRAMUSCULAR | Status: DC | PRN
  Administered 2016-01-07: 100 mg via INTRAVENOUS

## 2016-01-07 MED ORDER — LIDOCAINE HCL (CARDIAC) 20 MG/ML IV SOLN
INTRAVENOUS | Status: AC
  Filled 2016-01-07: qty 5

## 2016-01-07 MED ORDER — SODIUM CHLORIDE 0.9 % IV SOLN
INTRAVENOUS | Status: DC
  Administered 2016-01-07: 75 mL/h via INTRAVENOUS
  Administered 2016-01-07: 16:00:00 via INTRAVENOUS

## 2016-01-07 MED ORDER — ACETAMINOPHEN 325 MG PO TABS: 650.0000 mg | ORAL_TABLET | ORAL | Status: DC | PRN

## 2016-01-07 MED ORDER — CEFAZOLIN SODIUM 1-5 GM-% IV SOLN
1.0000 g | Freq: Three times a day (TID) | INTRAVENOUS | Status: AC
Start: 2016-01-07 — End: 2016-01-08
  Administered 2016-01-07 – 2016-01-08 (×2): 1 g via INTRAVENOUS
  Filled 2016-01-07 (×2): qty 50

## 2016-01-07 MED ORDER — SODIUM CHLORIDE 0.9 % IJ SOLN
3.0000 mL | Freq: Two times a day (BID) | INTRAMUSCULAR | Status: DC
Start: 2016-01-07 — End: 2016-01-10
  Administered 2016-01-07 – 2016-01-09 (×4): 3 mL via INTRAVENOUS

## 2016-01-07 MED ORDER — FENTANYL CITRATE (PF) 250 MCG/5ML IJ SOLN
INTRAMUSCULAR | Status: AC
  Filled 2016-01-07: qty 5

## 2016-01-07 MED ORDER — EPHEDRINE SULFATE 50 MG/ML IJ SOLN
INTRAMUSCULAR | Status: DC | PRN
  Administered 2016-01-07: 10 mg via INTRAVENOUS
  Administered 2016-01-07 (×2): 5 mg via INTRAVENOUS
  Administered 2016-01-07: 15 mg via INTRAVENOUS

## 2016-01-07 MED ORDER — ANTISEPTIC ORAL RINSE SOLUTION (CORINZ)
7.0000 mL | OROMUCOSAL | Status: DC
  Administered 2016-01-07 – 2016-01-09 (×13): 7 mL via OROMUCOSAL

## 2016-01-07 MED ORDER — ACETAMINOPHEN 650 MG RE SUPP
650.0000 mg | RECTAL | Status: DC | PRN
Start: 2016-01-07 — End: 2016-01-10

## 2016-01-07 MED ORDER — SUGAMMADEX SODIUM 200 MG/2ML IV SOLN
INTRAVENOUS | Status: AC
  Filled 2016-01-07: qty 2

## 2016-01-07 MED ORDER — ONDANSETRON HCL 4 MG/2ML IJ SOLN
INTRAMUSCULAR | Status: AC
  Filled 2016-01-07: qty 2

## 2016-01-07 MED ORDER — FENTANYL CITRATE (PF) 100 MCG/2ML IJ SOLN
50.0000 ug | INTRAMUSCULAR | Status: DC | PRN
  Administered 2016-01-09: 50 ug via INTRAVENOUS
  Filled 2016-01-07 (×3): qty 2

## 2016-01-07 MED ORDER — DEXAMETHASONE SODIUM PHOSPHATE 4 MG/ML IJ SOLN
6.0000 mg | Freq: Four times a day (QID) | INTRAMUSCULAR | Status: DC
  Administered 2016-01-07 – 2016-01-09 (×8): 6 mg via INTRAVENOUS
  Filled 2016-01-07 (×8): qty 2

## 2016-01-07 MED ORDER — FENTANYL CITRATE (PF) 100 MCG/2ML IJ SOLN
INTRAMUSCULAR | Status: DC | PRN
  Administered 2016-01-07: 125 ug via INTRAVENOUS
  Administered 2016-01-07 (×2): 50 ug via INTRAVENOUS
  Administered 2016-01-07: 25 ug via INTRAVENOUS

## 2016-01-07 MED ORDER — FUROSEMIDE 10 MG/ML IJ SOLN
40.0000 mg | Freq: Two times a day (BID) | INTRAMUSCULAR | Status: DC
Start: 2016-01-07 — End: 2016-01-08
  Administered 2016-01-07 – 2016-01-08 (×2): 40 mg via INTRAVENOUS
  Filled 2016-01-07 (×2): qty 4

## 2016-01-07 MED ORDER — CHLORHEXIDINE GLUCONATE 0.12% ORAL RINSE (MEDLINE KIT)
15.0000 mL | Freq: Two times a day (BID) | OROMUCOSAL | Status: DC
  Administered 2016-01-07 – 2016-01-10 (×6): 15 mL via OROMUCOSAL

## 2016-01-07 MED ORDER — ROCURONIUM BROMIDE 50 MG/5ML IV SOLN
INTRAVENOUS | Status: AC
  Filled 2016-01-07: qty 2

## 2016-01-07 MED ORDER — MIDAZOLAM HCL 5 MG/5ML IJ SOLN
INTRAMUSCULAR | Status: DC | PRN
  Administered 2016-01-07 (×2): 0.5 mg via INTRAVENOUS
  Administered 2016-01-07: 1 mg via INTRAVENOUS

## 2016-01-07 MED ORDER — SUGAMMADEX SODIUM 200 MG/2ML IV SOLN
INTRAVENOUS | Status: DC | PRN
  Administered 2016-01-07: 175 mg via INTRAVENOUS

## 2016-01-07 MED ORDER — MIDAZOLAM HCL 2 MG/2ML IJ SOLN
INTRAMUSCULAR | Status: AC
  Filled 2016-01-07: qty 2

## 2016-01-07 MED ORDER — LACTATED RINGERS IV SOLN
INTRAVENOUS | Status: DC | PRN
  Administered 2016-01-07 (×3): via INTRAVENOUS

## 2016-01-07 MED ORDER — SODIUM CHLORIDE 0.9 % IV SOLN: 250.0000 mL | INTRAVENOUS | Status: DC

## 2016-01-07 MED ORDER — BUPIVACAINE-EPINEPHRINE (PF) 0.25% -1:200000 IJ SOLN
INTRAMUSCULAR | Status: AC
  Filled 2016-01-07: qty 30

## 2016-01-07 MED ORDER — PHENYLEPHRINE HCL 10 MG/ML IJ SOLN
10.0000 mg | INTRAVENOUS | Status: DC | PRN
  Administered 2016-01-07: 60 ug/min via INTRAVENOUS

## 2016-01-07 MED ORDER — THROMBIN 20000 UNITS EX KIT
PACK | CUTANEOUS | Status: DC | PRN
  Administered 2016-01-07: 20000 [IU] via TOPICAL

## 2016-01-07 MED ORDER — HYDROMORPHONE HCL 1 MG/ML IJ SOLN: 0.2500 mg | INTRAMUSCULAR | Status: DC | PRN

## 2016-01-07 MED ORDER — LIDOCAINE HCL (CARDIAC) 20 MG/ML IV SOLN
INTRAVENOUS | Status: DC | PRN
  Administered 2016-01-07: 60 mg via INTRAVENOUS

## 2016-01-07 MED ORDER — ROCURONIUM BROMIDE 100 MG/10ML IV SOLN
INTRAVENOUS | Status: DC | PRN
  Administered 2016-01-07: 40 mg via INTRAVENOUS
  Administered 2016-01-07: 30 mg via INTRAVENOUS
  Administered 2016-01-07: 10 mg via INTRAVENOUS
  Administered 2016-01-07: 20 mg via INTRAVENOUS

## 2016-01-07 MED ORDER — BACITRACIN ZINC 500 UNIT/GM EX OINT
TOPICAL_OINTMENT | CUTANEOUS | Status: DC | PRN
  Administered 2016-01-07: 1 via TOPICAL

## 2016-01-07 MED ORDER — SODIUM CHLORIDE 0.9 % IJ SOLN
INTRAMUSCULAR | Status: AC
  Filled 2016-01-07: qty 10

## 2016-01-07 MED ORDER — ARTIFICIAL TEARS OP OINT
TOPICAL_OINTMENT | OPHTHALMIC | Status: AC
  Filled 2016-01-07: qty 3.5

## 2016-01-07 MED ORDER — EPHEDRINE SULFATE 50 MG/ML IJ SOLN
INTRAMUSCULAR | Status: AC
  Filled 2016-01-07: qty 1

## 2016-01-07 MED ORDER — THROMBIN 20000 UNITS EX SOLR
CUTANEOUS | Status: AC
  Filled 2016-01-07: qty 20000

## 2016-01-07 MED ORDER — ONDANSETRON HCL 4 MG/2ML IJ SOLN: 4.0000 mg | INTRAMUSCULAR | Status: DC | PRN

## 2016-01-07 MED ORDER — LACTATED RINGERS IV SOLN
INTRAVENOUS | Status: DC | PRN
  Administered 2016-01-07 (×2): via INTRAVENOUS

## 2016-01-07 MED ORDER — PHENYLEPHRINE 40 MCG/ML (10ML) SYRINGE FOR IV PUSH (FOR BLOOD PRESSURE SUPPORT)
PREFILLED_SYRINGE | INTRAVENOUS | Status: AC
  Filled 2016-01-07: qty 10

## 2016-01-07 MED ORDER — PHENYLEPHRINE HCL 10 MG/ML IJ SOLN
INTRAMUSCULAR | Status: DC | PRN
  Administered 2016-01-07 (×2): 80 ug via INTRAVENOUS
  Administered 2016-01-07: 120 ug via INTRAVENOUS
  Administered 2016-01-07 (×3): 40 ug via INTRAVENOUS

## 2016-01-07 MED ORDER — CEFAZOLIN SODIUM-DEXTROSE 2-3 GM-% IV SOLR
INTRAVENOUS | Status: DC | PRN
  Administered 2016-01-07 (×2): 2 g via INTRAVENOUS

## 2016-01-07 MED ORDER — FENTANYL CITRATE (PF) 100 MCG/2ML IJ SOLN
50.0000 ug | INTRAMUSCULAR | Status: DC | PRN
  Administered 2016-01-07 – 2016-01-08 (×6): 50 ug via INTRAVENOUS
  Filled 2016-01-07 (×4): qty 2

## 2016-01-07 MED ORDER — DOUBLE ANTIBIOTIC 500-10000 UNIT/GM EX OINT
TOPICAL_OINTMENT | CUTANEOUS | Status: AC
  Filled 2016-01-07: qty 1

## 2016-01-07 MED ORDER — PROPOFOL 10 MG/ML IV BOLUS
INTRAVENOUS | Status: DC | PRN
  Administered 2016-01-07: 140 mg via INTRAVENOUS
  Administered 2016-01-07: 30 mg via INTRAVENOUS

## 2016-01-07 MED ORDER — SODIUM CHLORIDE 0.9 % IJ SOLN
3.0000 mL | INTRAMUSCULAR | Status: DC | PRN
  Administered 2016-01-10: 3 mL via INTRAVENOUS
  Filled 2016-01-07: qty 3

## 2016-01-07 MED ORDER — PROPOFOL 1000 MG/100ML IV EMUL
0.0000 ug/kg/min | INTRAVENOUS | Status: DC
  Administered 2016-01-07 (×2): 50 ug/kg/min via INTRAVENOUS
  Administered 2016-01-07: 100 ug/kg/min via INTRAVENOUS
  Administered 2016-01-07 – 2016-01-08 (×2): 50 ug/kg/min via INTRAVENOUS
  Administered 2016-01-08: 25 ug/kg/min via INTRAVENOUS
  Administered 2016-01-08: 50 ug/kg/min via INTRAVENOUS
  Administered 2016-01-09: 25 ug/kg/min via INTRAVENOUS
  Filled 2016-01-07 (×6): qty 100

## 2016-01-07 MED ORDER — BUPIVACAINE-EPINEPHRINE 0.25% -1:200000 IJ SOLN
INTRAMUSCULAR | Status: DC | PRN
  Administered 2016-01-07: 25 mL

## 2016-01-07 MED ORDER — POTASSIUM CHLORIDE 10 MEQ/100ML IV SOLN
10.0000 meq | INTRAVENOUS | Status: AC
  Administered 2016-01-08 (×2): 10 meq via INTRAVENOUS
  Filled 2016-01-07 (×2): qty 100

## 2016-01-07 MED ORDER — ONDANSETRON HCL 4 MG/2ML IJ SOLN
INTRAMUSCULAR | Status: DC | PRN
Start: 2016-01-07 — End: 2016-01-07
  Administered 2016-01-07: 4 mg via INTRAVENOUS

## 2016-01-07 MED ORDER — PROPOFOL 10 MG/ML IV BOLUS
INTRAVENOUS | Status: AC
  Filled 2016-01-07: qty 40

## 2016-01-07 MED ORDER — PANTOPRAZOLE SODIUM 40 MG IV SOLR
40.0000 mg | INTRAVENOUS | Status: DC
  Administered 2016-01-08 – 2016-01-09 (×2): 40 mg via INTRAVENOUS
  Filled 2016-01-07 (×2): qty 40

## 2016-01-07 MED ORDER — SUCCINYLCHOLINE CHLORIDE 20 MG/ML IJ SOLN
INTRAMUSCULAR | Status: AC
  Filled 2016-01-07: qty 1

## 2016-01-07 MED FILL — Thrombin For Soln 20000 Unit: CUTANEOUS | Qty: 1 | Status: AC

## 2016-01-07 SURGICAL SUPPLY — 80 items
APL SKNCLS STERI-STRIP NONHPOA (GAUZE/BANDAGES/DRESSINGS) ×1
BENZOIN TINCTURE PRP APPL 2/3 (GAUZE/BANDAGES/DRESSINGS) ×3 IMPLANT
BIT DRILL MOUNTAINEER FIX 14 (BIT) ×1
BIT DRILL MOUNTAINEER FIX 14MM (BIT) ×1 IMPLANT
BIT DRILL MOUNTAINER FXED 12MM (DRILL) ×1 IMPLANT
BLADE CLIPPER SURG NEURO (BLADE) IMPLANT
BUR NEURO DRILL SOFT 3.0X3.8M (BURR) ×3 IMPLANT
BUR PRESCISION 1.7 ELITE (BURR) ×3 IMPLANT
CLOSURE STERI-STRIP 1/2X4 (GAUZE/BANDAGES/DRESSINGS) ×1
CLOSURE WOUND 1/2 X4 (GAUZE/BANDAGES/DRESSINGS)
CLSR STERI-STRIP ANTIMIC 1/2X4 (GAUZE/BANDAGES/DRESSINGS) ×2 IMPLANT
CONT SPEC STER OR (MISCELLANEOUS) IMPLANT
CORDS BIPOLAR (ELECTRODE) ×3 IMPLANT
COVER SURGICAL LIGHT HANDLE (MISCELLANEOUS) ×3 IMPLANT
DRAIN CHANNEL 10F 3/8 F FF (DRAIN) IMPLANT
DRAIN CHANNEL 15F RND FF W/TCR (WOUND CARE) IMPLANT
DRAIN HEMOVAC 1/8 X 5 (WOUND CARE) IMPLANT
DRAPE INCISE IOBAN 66X45 STRL (DRAPES) ×3 IMPLANT
DRAPE LAPAROTOMY T 102X78X121 (DRAPES) ×3 IMPLANT
DRAPE POUCH INSTRU U-SHP 10X18 (DRAPES) ×3 IMPLANT
DRAPE PROXIMA HALF (DRAPES) ×9 IMPLANT
DRAPE SURG 17X23 STRL (DRAPES) ×24 IMPLANT
DRAPE TABLE COVER HEAVY DUTY (DRAPES) ×3 IMPLANT
DRILL BIT MOUNTAINEER FIX 14MM (BIT) ×2
DRILL MOUNTAINEER FIXED 12MM (DRILL) ×3
DURAPREP 26ML APPLICATOR (WOUND CARE) ×3 IMPLANT
ELECT BLADE 4.0 EZ CLEAN MEGAD (MISCELLANEOUS) ×3
ELECT CAUTERY BLADE 6.4 (BLADE) ×3 IMPLANT
ELECT REM PT RETURN 9FT ADLT (ELECTROSURGICAL) ×3
ELECTRODE BLDE 4.0 EZ CLN MEGD (MISCELLANEOUS) ×1 IMPLANT
ELECTRODE REM PT RTRN 9FT ADLT (ELECTROSURGICAL) ×1 IMPLANT
EVACUATOR SILICONE 100CC (DRAIN) ×3 IMPLANT
GAUZE SPONGE 4X4 12PLY STRL (GAUZE/BANDAGES/DRESSINGS) ×3 IMPLANT
GAUZE SPONGE 4X4 16PLY XRAY LF (GAUZE/BANDAGES/DRESSINGS) ×12 IMPLANT
GLOVE BIO SURGEON STRL SZ7 (GLOVE) ×3 IMPLANT
GLOVE BIO SURGEON STRL SZ8 (GLOVE) ×3 IMPLANT
GLOVE BIOGEL PI IND STRL 8 (GLOVE) ×1 IMPLANT
GLOVE BIOGEL PI INDICATOR 8 (GLOVE) ×2
GOWN STRL REUS W/ TWL LRG LVL3 (GOWN DISPOSABLE) ×5 IMPLANT
GOWN STRL REUS W/TWL LRG LVL3 (GOWN DISPOSABLE) ×15
IV CATH 14GX2 1/4 (CATHETERS) ×3 IMPLANT
KIT BASIN OR (CUSTOM PROCEDURE TRAY) ×3 IMPLANT
KIT ROOM TURNOVER OR (KITS) ×3 IMPLANT
MIX DBX 20CC MTF (Putty) ×3 IMPLANT
NEEDLE 22X1 1/2 (OR ONLY) (NEEDLE) ×3 IMPLANT
NEEDLE HYPO 25GX1X1/2 BEV (NEEDLE) ×3 IMPLANT
NS IRRIG 1000ML POUR BTL (IV SOLUTION) ×6 IMPLANT
PACK LAMINECTOMY ORTHO (CUSTOM PROCEDURE TRAY) ×3 IMPLANT
PAD ARMBOARD 7.5X6 YLW CONV (MISCELLANEOUS) ×6 IMPLANT
PATTIES SURGICAL .5 X.5 (GAUZE/BANDAGES/DRESSINGS) ×3 IMPLANT
PATTIES SURGICAL .5 X1 (DISPOSABLE) ×3 IMPLANT
PIN MAYFIELD SKULL DISP (PIN) ×3 IMPLANT
PUTTY DBX 1CC (Putty) ×3 IMPLANT
PUTTY DBX 1CC DEPUY (Putty) ×1 IMPLANT
ROD MOUNTAINEER 120MM (Rod) ×3 IMPLANT
ROD MOUNTAINEER 300MM (Rod) ×3 IMPLANT
SCREW F A 3.5X12 (Screw) ×3 IMPLANT
SCREW F A 3.5X14 (Screw) ×15 IMPLANT
SCREW INNER (Screw) ×30 IMPLANT
SCREW MTN POLY 4.0X22MM (Screw) ×3 IMPLANT
SPONGE GAUZE 4X4 12PLY STER LF (GAUZE/BANDAGES/DRESSINGS) ×3 IMPLANT
SPONGE INTESTINAL PEANUT (DISPOSABLE) IMPLANT
SPONGE SURGIFOAM ABS GEL 100 (HEMOSTASIS) ×3 IMPLANT
STRIP CLOSURE SKIN 1/2X4 (GAUZE/BANDAGES/DRESSINGS) IMPLANT
SURGIFLO W/THROMBIN 8M KIT (HEMOSTASIS) IMPLANT
SUT MNCRL AB 4-0 PS2 18 (SUTURE) ×3 IMPLANT
SUT VIC AB 0 CT1 18XCR BRD 8 (SUTURE) ×1 IMPLANT
SUT VIC AB 0 CT1 8-18 (SUTURE) ×3
SUT VIC AB 1 CT1 18XCR BRD 8 (SUTURE) ×3 IMPLANT
SUT VIC AB 1 CT1 8-18 (SUTURE) ×9
SUT VIC AB 2-0 CT2 18 VCP726D (SUTURE) ×6 IMPLANT
SYR BULB IRRIGATION 50ML (SYRINGE) ×3 IMPLANT
SYR CONTROL 10ML LL (SYRINGE) ×9 IMPLANT
TAPE CLOTH 4X10 WHT NS (GAUZE/BANDAGES/DRESSINGS) ×3 IMPLANT
TAPE CLOTH SURG 4X10 WHT LF (GAUZE/BANDAGES/DRESSINGS) ×3 IMPLANT
TOWEL OR 17X24 6PK STRL BLUE (TOWEL DISPOSABLE) ×3 IMPLANT
TOWEL OR 17X26 10 PK STRL BLUE (TOWEL DISPOSABLE) ×3 IMPLANT
TRAY FOLEY CATH 16FRSI W/METER (SET/KITS/TRAYS/PACK) ×3 IMPLANT
WATER STERILE IRR 1000ML POUR (IV SOLUTION) ×3 IMPLANT
YANKAUER SUCT BULB TIP NO VENT (SUCTIONS) ×3 IMPLANT

## 2016-01-07 NOTE — Progress Notes (Signed)
I was called to PACU with a patient in respiratory distress. Pt had been extubated after having a positive leak and pulling tidal volumes of 600cc. By the time he arrived to pacu, the patient was complaining of an inability to get his breath. An oral airway as well as a nasal trumpet were inserted resulting in much better air movement. I looked into the patient's oropharynx with a glidescope and noted edema of much of the soft tissue, including the larynx and vocal cords. At that point, I decided to re-intubate the patient. This was done awake with topical lidocaine and the glidescope and the patient spontaneously breathing. Intubation was successful, the patient was sedated, and placed on the ventilator. BS=B. +EtCO2. CXR in progress. ICU consulted and family informed.

## 2016-01-07 NOTE — Consult Note (Signed)
PULMONARY / CRITICAL CARE MEDICINE   Name: Ryan Cobb MRN: [id number] DOB: 1951-06-25    ADMISSION DATE:  01/06/2016 CONSULTATION DATE:  01/07/2016  REFERRING MD:  Dr. Lynann Bologna  CHIEF COMPLAINT:  Post op vent  HISTORY OF PRESENT ILLNESS:   65 year old male with PMH as below, which includes HTN, basal cell adenoma, GERD, and cervical disc disorder. Most recently, chief complaints have been ongoing neck and left arm pain. He underwent cervical fusion in 2010. CT scan was done to evaluate this pain and revealed a a nonunion at C7/T1 and MRI demonstrated bilateral neural foraminal stenosis C3-C7. The patient failed conservative management of this pain and presented to [facility] for surgery under Dr. Lynann Bologna 1/18. The procedure was performed in 2 phases and the following interventions were performed: Anterior cervical decompression and fusion C3-4, C4-5, C5-6, and C6-7,  placement of anterior instrumentation, C3-C7, and Insertion of interbody device x4 (phase 1) Posterior cervical fusion C3-4, C4-5, C5-6, C6-7, C7-T1, Placement of posterior segmental instrumentation, C3-T1, and Bilateral laminotomy with partial facetectomy and foraminotomy, C6-7 (phase 2). He was initially extubated in the OR but fairly quickly began c/o SOB, difficulty swallowing and has progressive respiratory distress requiring reintubation.  PCCM to assist with vent and medical management.    PAST MEDICAL HISTORY :  He  has a past medical history of Hyperlipidemia; Hypertension; Seasonal rhinitis; Colon polyps (2006); Basal cell adenoma; Arthritis; GERD (gastroesophageal reflux disease); and Cervical disc disorder.  PAST SURGICAL HISTORY: He  has past surgical history that includes Knee arthroscopy (2007); Knee surgery (1973); Replacement total knee (2009); Lipoma excision; Cervical laminectomy (2010); Inguinal hernia repair (2003); Colonoscopy w/ polypectomy (2006); labrum tear & bone spur surgery (02/2013); Appendectomy  (1995); abdominal cyst; and Cervical fusion (01/06/2016).  No Known Allergies  No current facility-administered medications on file prior to encounter.   Current Outpatient Prescriptions on File Prior to Encounter  Medication Sig  . aspirin 81 MG tablet Take 243 mg by mouth daily.   Marland Kitchen b complex vitamins tablet Take 1 tablet by mouth daily.  Marland Kitchen Bioflavonoid Products (ESTER C PO) Take 500 mg by mouth daily.  . calcium carbonate (OS-CAL) 600 MG TABS tablet Take 600 mg by mouth daily.  . carvedilol (COREG) 6.25 MG tablet 1/2 bid (Patient taking differently: Take 3.125 mg by mouth 2 (two) times daily with a meal. )  . Cholecalciferol (VITAMIN D3) 3000 UNITS TABS Take by mouth. Reported on 12/23/2015  . Coenzyme Q10 (CO Q 10) 10 MG CAPS Take 10 mg by mouth daily.   . fluticasone (FLONASE) 50 MCG/ACT nasal spray Place 1 spray into the nose 2 (two) times daily. (Patient taking differently: Place 1 spray into the nose daily as needed for allergies or rhinitis. )  . Garlic 123XX123 MG CAPS Take 1,000 mg by mouth daily.   Marland Kitchen LECITHIN PO Take 1,200 mg by mouth daily.  Marland Kitchen lisinopril-hydrochlorothiazide (PRINZIDE,ZESTORETIC) 20-25 MG per tablet TAKE 1 TABLET EACH DAY. (Patient taking differently: Take 1 tablet by mouth daily. )  . loratadine (CLARITIN) 10 MG tablet Take 10 mg by mouth daily.  . Magnesium 250 MG TABS Take 250 mg by mouth daily.   . Multiple Vitamins-Minerals (CENTRUM SILVER PO) Take 1 tablet by mouth daily.   . Multiple Vitamins-Minerals (OCUVITE EYE + MULTI PO) Take 1 tablet by mouth daily.   . Omega 3 1200 MG CAPS Take 1,200 mg by mouth daily.     FAMILY HISTORY:  His indicated that  his mother is deceased. He indicated that his father is deceased. He indicated that both of his sisters are alive. He indicated that his brother is alive.   SOCIAL HISTORY: He  reports that he quit smoking about 36 years ago. His smoking use included Cigarettes. He has never used smokeless tobacco. He reports  that he drinks about 1.8 - 2.4 oz of alcohol per week. He reports that he does not use illicit drugs.  REVIEW OF SYSTEMS:   Unable, pt sedated on vent.  Per ortho PA he was c/o cough and mucous this am prior to second phase of OR.   SUBJECTIVE:   VITAL SIGNS: BP 123/73 mmHg  Pulse 76  Temp(Src) 97.5 F (36.4 C) (Oral)  Resp 14  Wt 83.462 kg (184 lb)  SpO2 96%  HEMODYNAMICS:    VENTILATOR SETTINGS: Vent Mode:  [-] PRVC FiO2 (%):  [50 %] 50 % Set Rate:  [14 bmp] 14 bmp Vt Set:  [530 mL] 530 mL PEEP:  [5 cmH20] 5 cmH20 Plateau Pressure:  [18 cmH20] 18 cmH20  INTAKE / OUTPUT: I/O last 3 completed shifts: In: 1800 [I.V.:1800] Out: 750 [Urine:725; Blood:25]  PHYSICAL EXAMINATION: General:  wdwn male NAD sedated in PACU Neuro:  Sedated on vent, RASS -2 HEENT:  Mm moist, ETT, cervical collar, anterior cervical dressing c/d, neck soft, no significant swelling or obvious hematoma  Cardiovascular:  s1s2 rrr Lungs:  resps even non labored on vent, diminished bases, few scattered rhonchi R>L  Abdomen:  Soft, hypoactive bs  Musculoskeletal:  Warm and dry, no edema   LABS:  BMET No results for input(s): NA, K, CL, CO2, BUN, CREATININE, GLUCOSE in the last 168 hours.  Electrolytes No results for input(s): CALCIUM, MG, PHOS in the last 168 hours.  CBC No results for input(s): WBC, HGB, HCT, PLT in the last 168 hours.  Coag's No results for input(s): APTT, INR in the last 168 hours.  Sepsis Markers No results for input(s): LATICACIDVEN, PROCALCITON, O2SATVEN in the last 168 hours.  ABG No results for input(s): PHART, PCO2ART, PO2ART in the last 168 hours.  Liver Enzymes No results for input(s): AST, ALT, ALKPHOS, BILITOT, ALBUMIN in the last 168 hours.  Cardiac Enzymes No results for input(s): TROPONINI, PROBNP in the last 168 hours.  Glucose No results for input(s): GLUCAP in the last 168 hours.  Imaging Dg Cervical Spine 1 View  01/07/2016  CLINICAL DATA:   Cervical fusion at the C3 through T1 levels. EXAM: CERVICAL SPINE 1 VIEW COMPARISON:  Current and previous examinations. FINDINGS: A single posterior the C-arm view of the cervical spine demonstrates the anterior fusion hardware seen at the C3 through C6 levels and C7-T1 level on lateral radiographs earlier today. The current image also demonstrates pedicle screw and rod fixation spanning those levels. IMPRESSION: The interval pedicle screw and rod fixation at the C3 through T1 levels. Electronically Signed   By: Claudie Revering M.D.   On: 01/07/2016 12:16   Dg Cervical Spine 2 Or 3 Views  01/07/2016  CLINICAL DATA:  C3 through T1 cervical fusion. EXAM: CERVICAL SPINE - 2-3 VIEW COMPARISON:  Previous examinations, including the C-arm radiographs obtained earlier today. FINDINGS: Three lateral views of the cervical spine demonstrate and anterior screw and plate fusion at the C3 through C6 levels. Previously demonstrated anterior screw and plate fusion at the QA348G level. Normal alignment on these images. IMPRESSION: Anterior cervical fusion at the C3 through C6 levels and at the C7-T1 level. Electronically  Signed   By: Claudie Revering M.D.   On: 01/07/2016 12:13   Dg Chest Port 1 View  01/07/2016  CLINICAL DATA:  Intubated after cervical spinal fusion. Difficult intubation. EXAM: PORTABLE CHEST 1 VIEW COMPARISON:  12/29/2015 FINDINGS: Endotracheal tube terminates approximately 3.5 cm above the carina. The cardiac silhouette is within normal limits for size. Lung volumes are mildly diminished compared to the prior study with perihilar and basilar predominant interstitial and airspace opacities bilaterally. No sizable pleural effusion or pneumothorax is identified. No acute osseous abnormality is seen. IMPRESSION: 1. Endotracheal tube in satisfactory position. 2. Bilateral lung opacities which may reflect edema. Electronically Signed   By: Logan Bores M.D.   On: 01/07/2016 13:31   Dg C-arm 1-60 Min  01/07/2016   CLINICAL DATA:  Cervical fusion at the C3 through T1 levels. EXAM: CERVICAL SPINE 1 VIEW COMPARISON:  Current and previous examinations. FINDINGS: A single posterior the C-arm view of the cervical spine demonstrates the anterior fusion hardware seen at the C3 through C6 levels and C7-T1 level on lateral radiographs earlier today. The current image also demonstrates pedicle screw and rod fixation spanning those levels. IMPRESSION: The interval pedicle screw and rod fixation at the C3 through T1 levels. Electronically Signed   By: Claudie Revering M.D.   On: 01/07/2016 12:16     STUDIES:    CULTURES:   ANTIBIOTICS: Ancef (peri-op) 1/19>>>  SIGNIFICANT EVENTS: 1/18>> anterior cervical fusion  1/19>> posterior cervical fusion, post op respiratory failure, re-intubated   LINES/TUBES: ETT 1/19>>>  DISCUSSION: 65yo male with post op respiratory failure in setting upper airway edema after complicated cervical spine surgery   ASSESSMENT / PLAN:  PULMONARY Acute respiratory failure  Upper airway edema/compromise - post anterior cervical surgery  Closed glottic edema likely P:   Vent support - 8cc/kg  F/u CXR  F/u ABG Check cuff leak prior to further attempts at extubation  Will likely need steroids -- ortho would very much like to avoid this as he has had nonunion in the past.  Will hold off for now but d/w PA that this will likely be necessary.    CARDIOVASCULAR Hx HTN  R/o ischemia P:  Hold home coreg, lisinopril for now   RENAL No active issue  P:   Chem now and in am  Monitor UOP   GASTROINTESTINAL Hx GERD P:   NPO  PPI  If unable to extubate 1/18 consider TF   HEMATOLOGIC No active issue  P:  CBC now  SCD's   INFECTIOUS No active issue  P:   Monitor wbc, fever curve off abx  Had ancef x 2 peri-op   ENDOCRINE No active issue  P:   Monitor glucose on chem   NEUROLOGIC Post op pain control  Sedation requirements on vent  P:   RASS goal: -2 Propofol,  PRN fentanyl   ORTHO Left-sided cervical radiculopathy. Multilevel left-sided neural foraminal stenosis, C3-C7 -- s/p multi-phase cervical and posterior approach fusion 1/19 Status post previous C7-T1 fusion that did go onto a nonunion (2010)  P:  Per ortho  Cont c-collar  Pain control    FAMILY  - Updates:  Family updated by PACU RN, will f/u when he is in ICU     Nickolas Madrid, NP 01/07/2016  2:19 PM Pager: [phone] or [phone]   STAFF NOTE: I, Merrie Roof, MD FACP have personally reviewed patient's available data, including medical history, events of note, physical examination and test results as  part of my evaluation. I have discussed with resident/NP and other care providers such as pharmacist, RN and RRT. In addition, I personally evaluated patient and elicited key findings of: on vent, sedated, pcxr with pulm edema likely, airwway edema noted by anesthesia, concern would be neg pressure edema from closed glottic and swelling, would provide lasix tonight, appears renal fxn can tolerate, also would add decadron x 24 hr, leak testing in am with weaning, re assess k in pm, assess trop, ecg, echo with new edema, maintain prop with wua in am , NO ACEI withupper airway issues already, abg reviewed, reduce slight MV on vent rest The patient is critically ill with multiple organ systems failure and requires high complexity decision making for assessment and support, frequent evaluation and titration of therapies, application of advanced monitoring technologies and extensive interpretation of multiple databases.   Critical Care Time devoted to patient care services described in this note is 30 Minutes. This time reflects time of care of this signee: Merrie Roof, MD FACP. This critical care time does not reflect procedure time, or teaching time or supervisory time of PA/NP/Med student/Med Resident etc but could involve care discussion time. Rest per NP/medical resident  whose note is outlined above and that I agree with   Lavon Paganini. Titus Mould, MD, [place] Pgr: [facility] 01/07/2016 4:30 PM

## 2016-01-07 NOTE — Progress Notes (Signed)
PT transported from PACU to 8M on vent, no complications noted.

## 2016-01-07 NOTE — Progress Notes (Signed)
Patient arrived from OR, with CRNA at bedside, agitated and stating he can't breath. Face mask at 10L ,stridorous sounds with retraction noted; O2 sat reading 67%; Bag/mask FI02 at 100% applied.  Dr Ola Spurr called to bedside.

## 2016-01-07 NOTE — Op Note (Signed)
NAME:  Ryan Cobb, Ryan Cobb NO.:  [id number]  MEDICAL RECORD NO.:  [id number]  LOCATION:  [facility]                         FACILITY:  [facility]  PHYSICIAN:  Phylliss Bob, MD      DATE OF BIRTH:  [birth year]  DATE OF PROCEDURE:  01/07/2016                              OPERATIVE REPORT   PREOPERATIVE DIAGNOSIS:  Status post anterior cervical decompression and fusion C3-C7 with a remote history of C7-T1 ACDF that did go on to a nonunion, requiring a posterior fusion and instrumentation from C3-T1.  POSTOPERATIVE DIAGNOSIS:  Status post anterior cervical decompression and fusion C3-C7 with a remote history of C7-T1 ACDF that did go on to a nonunion, requiring a posterior fusion and instrumentation from C3-T1.  PROCEDURE:  (Stage 2 of 2) 1. Posterior cervical fusion C3-4, C4-5, C5-6, C6-7, C7-T1. 2. Placement of posterior segmental instrumentation, C3-T1. 3. Bilateral laminotomy with partial facetectomy and foraminotomy, C6-     7. 4. Use of morselized allograft. 5. Application and removal of Mayfield head holder.  SURGEON:  Phylliss Bob, MD  ASSISTANT:  Pricilla Holm, PA-C  ANESTHESIA:  General endotracheal anesthesia.  COMPLICATIONS:  None.  DISPOSITION:  Stable.  ESTIMATED BLOOD LOSS:  200 mL.  INDICATIONS FOR SURGERY:  Briefly, Mr. Spruiell is a pleasant 65 year old male, who did have left-sided cervical radiculopathy in addition to multilevel neuroforaminal stenosis from C3-C7.  The patient is also many years status post a C7-T1 attempted fusion which did go on to a nonunion.  Given the nonunion and radiculopathy, the patient did present on January 06, 2016, for stage I of a two-stage procedure.  I will refer to that operative report for full account of that particular procedure and additional indications for the procedure.  The patient presented today for stage II, involving a posterior fusion with instrumentation from C3-T1.  OPERATIVE DETAILS:  On January 07, 2016, patient was brought to surgery and general endotracheal anesthesia was administered.  A Mayfield head holder was applied by me.  The patient was rolled prone onto the hospital bed.  The head was secured into appropriate position.  The patient's arms were secured to his sides.  All bony prominences were padded.  The head was secured to the bed appropriately.  The neck was then prepped and draped in the usual sterile fashion.  Antibiotics were given and a time-out procedure was performed.  I then made a midline incision overlying the C3-T1 region.  The fascia was incised at the midline.  The paraspinal musculature was retracted laterally.  The lamina from C3-T1 was identified and subperiosteally exposed.  Once the appropriate landmarks were identified, I did use a 1.7 mm bur to prepare the entry point of the C3, C4, and C5 lateral mass screws bilaterally. I then performed a bilateral partial facetectomy and foraminotomy at the C6-7 level.  This did allow decompression of the bilateral C7 nerves, and did allow access to the medial and superior border of the C7 pedicle.  Using the anatomic landmarks, I did cannulate the C7 pedicle bilaterally using a high-speed bur followed by a gearshift probe, followed by 3.5 mm tap.  Again, using anatomic landmarks, the T1 pedicles were cannulated  in the manner previously described.  At the lateral mass screws, a 3 mm tap was utilized.  Bone wax was placed in the cannulated pedicle in lateral mass holes.  I then decorticated the facet joints bilaterally from C3-T1.  The posterior elements were also decorticated using a 3 mm high-speed bur.  I then packed DBX putty into the facet joints to be fused.  I then placed 3.5 x 14 mm lateral mass screws from C3-C5 bilaterally.  Of note, a 12-mm length screw was placed on the right at C5.  A 4 x 22 mm screws were placed at C7 and T1 bilaterally.  Posterior rods were cut to the appropriate length  and contoured into the appropriate degree of lordosis.  The rods were secured into the tulip heads of the screws.  Caps were placed and final locking procedure was performed bilaterally.  I was very pleased with the final fluoroscopic images.  The wound was copiously irrigated throughout the surgery using a total of approximately 3 L of normal saline.  Of note, I did perform a pushing and pulling maneuver across the C7-T1 intervertebral space to assess the adequacy of the fusion.  It was clear that there was a nonunion across the C7-T1 space, as was obvious motion identified.  Once the caps were locked, I proceeded with closure.  The wound was closed in layers, #1 Vicryl was used to close the fascia, followed by 0 Vicryl, followed by 2-0 Vicryl, followed by 3- 0 Monocryl.  Benzoin and Steri-Strips were applied followed by sterile dressing.  All instrument counts were correct at the termination of the Procedure.   Of note, Pricilla Holm was my assistant throughout surgery, and did aid in retraction, suctioning, and closure from start to finish.   Of note, patient was noted to be stridorous in the PACU immediately upon transfer to PACU. Nursing and CRNA was made aware by me. Dr. Ola Spurr did then evaluate the patient and noted respiratory distress and a swollen airway and did make a decision to reintubate patient. He then made me and family aware. Patient when then transferred to the ICU in stable condition. Will continue to monitor the patient closely in tandem with ICU team.   Phylliss Bob, MD     MD/MEDQ  D:  01/07/2016  T:  01/07/2016  Job:  [id number]

## 2016-01-07 NOTE — Transfer of Care (Signed)
Immediate Anesthesia Transfer of Care Note  Patient: Ryan Cobb  Procedure(s) Performed: Procedure(s) with comments: POSTERIOR CERVICAL FUSION LEVEL 5 (N/A) - Posterior spinal fusion cervical 3-4, cervical 4-5, cervical 5-6, cervical 6-7, cervical 7-thoracic 1 with instrumentation and allograft  Patient Location: PACU  Anesthesia Type:General  Level of Consciousness: awake and alert   Airway & Oxygen Therapy: Patient Spontanous Breathing, Patient connected to face mask oxygen, Patient re-intubated, Patient placed on Ventilator (see vital sign flow sheet for setting) and Pt oxygenation and ventilation deteriorating in PACU.  Immediately assessed and managed with Dr. Ola Spurr.  Post-op Assessment: Report given to RN, Post -op Vital signs reviewed and unstable, Anesthesiologist notified and Patient moving all extremities  Post vital signs: Reviewed and stable  Last Vitals:  Filed Vitals:   01/07/16 0613 01/07/16 1314  BP: 169/84   Pulse: 74 88  Temp: 36.7 C   Resp: 18 26    Complications: Patient re-intubated

## 2016-01-07 NOTE — Anesthesia Postprocedure Evaluation (Signed)
Anesthesia Post Note  Patient: Ryan Cobb  Procedure(s) Performed: Procedure(s) (LRB): POSTERIOR CERVICAL FUSION LEVEL 5 (N/A)  Patient location during evaluation: SICU Anesthesia Type: General Level of consciousness: sedated Pain management: pain level controlled Vital Signs Assessment: post-procedure vital signs reviewed and stable Respiratory status: patient re-intubated Cardiovascular status: stable Anesthetic complications: no    Last Vitals:  Filed Vitals:   01/07/16 1314 01/07/16 1320  BP:  148/76  Pulse: 88 75  Temp:    Resp: 26 15    Last Pain:  Filed Vitals:   01/07/16 1324  PainSc: 5                  Chaquita Basques,W. EDMOND

## 2016-01-07 NOTE — Anesthesia Procedure Notes (Addendum)
Procedure Name: Intubation Date/Time: 01/07/2016 7:49 AM Performed by: Williemae Area B Pre-anesthesia Checklist: Patient identified, Emergency Drugs available, Suction available and Patient being monitored Patient Re-evaluated:Patient Re-evaluated prior to inductionOxygen Delivery Method: Circle system utilized Preoxygenation: Pre-oxygenation with 100% oxygen Intubation Type: IV induction Ventilation: Mask ventilation without difficulty Laryngoscope Size: Mac, 4 and Glidescope Grade View: Grade II Tube type: Oral Tube size: 7.5 mm Number of attempts: 1 Airway Equipment and Method: Stylet and Video-laryngoscopy (cervical collar removed after induction, PA okay.) Placement Confirmation: ETT inserted through vocal cords under direct vision,  positive ETCO2 and breath sounds checked- equal and bilateral Secured at: 22 (cm at teeth) cm Tube secured with: Tape Dental Injury: Teeth and Oropharynx as per pre-operative assessment    Procedure Name: Intubation Date/Time: 01/07/2016 1:00 PM Performed by: Williemae Area B Pre-anesthesia Checklist: Patient identified, Emergency Drugs available, Suction available and Patient being monitored Patient Re-evaluated:Patient Re-evaluated prior to inductionOxygen Delivery Method: Ambu bag Preoxygenation: Pre-oxygenation with 100% oxygen (Dr. Ola Spurr managing the pt with ambu O2 and then used glidescope to reinsert  oral ETT.  Pt tol well except for BP up.) Laryngoscope Size: Mac, 4 and Glidescope Tube type: Oral Tube size: 7.5 mm Number of attempts: 1 Airway Equipment and Method: Stylet and Video-laryngoscopy Placement Confirmation: ETT inserted through vocal cords under direct vision,  CO2 detector and breath sounds checked- equal and bilateral Secured at: 22 (cm at teeth) cm Tube secured with: Tape Dental Injury: Teeth and Oropharynx as per pre-operative assessment  Comments: Respiratory Therapy present and put pt to mechanical ventilator.  50% O2 and  5 cm PEEP

## 2016-01-08 ENCOUNTER — Inpatient Hospital Stay (HOSPITAL_COMMUNITY): Payer: No Typology Code available for payment source

## 2016-01-08 ENCOUNTER — Encounter (HOSPITAL_COMMUNITY): Payer: Self-pay | Admitting: Orthopedic Surgery

## 2016-01-08 DIAGNOSIS — J810 Acute pulmonary edema: Secondary | ICD-10-CM

## 2016-01-08 DIAGNOSIS — J811 Chronic pulmonary edema: Secondary | ICD-10-CM | POA: Insufficient documentation

## 2016-01-08 DIAGNOSIS — R0600 Dyspnea, unspecified: Secondary | ICD-10-CM

## 2016-01-08 LAB — BASIC METABOLIC PANEL
ANION GAP: 8 (ref 5–15)
BUN: 7 mg/dL (ref 6–20)
CALCIUM: 8.5 mg/dL — AB (ref 8.9–10.3)
CO2: 30 mmol/L (ref 22–32)
Chloride: 101 mmol/L (ref 101–111)
Creatinine, Ser: 0.93 mg/dL (ref 0.61–1.24)
GLUCOSE: 168 mg/dL — AB (ref 65–99)
POTASSIUM: 3.6 mmol/L (ref 3.5–5.1)
Sodium: 139 mmol/L (ref 135–145)

## 2016-01-08 LAB — TROPONIN I: TROPONIN I: 0.06 ng/mL — AB (ref <0.031)

## 2016-01-08 LAB — CBC
HEMATOCRIT: 40.6 % (ref 39.0–52.0)
Hemoglobin: 13.6 g/dL (ref 13.0–17.0)
MCH: 30.7 pg (ref 26.0–34.0)
MCHC: 33.5 g/dL (ref 30.0–36.0)
MCV: 91.6 fL (ref 78.0–100.0)
PLATELETS: 186 10*3/uL (ref 150–400)
RBC: 4.43 MIL/uL (ref 4.22–5.81)
RDW: 13.3 % (ref 11.5–15.5)
WBC: 18.1 10*3/uL — AB (ref 4.0–10.5)

## 2016-01-08 LAB — GLUCOSE, CAPILLARY
GLUCOSE-CAPILLARY: 157 mg/dL — AB (ref 65–99)
GLUCOSE-CAPILLARY: 115 mg/dL — AB (ref 65–99)
GLUCOSE-CAPILLARY: 138 mg/dL — AB (ref 65–99)
Glucose-Capillary: 164 mg/dL — ABNORMAL HIGH (ref 65–99)
Glucose-Capillary: 143 mg/dL — ABNORMAL HIGH (ref 65–99)

## 2016-01-08 MED ORDER — FUROSEMIDE 10 MG/ML IJ SOLN
60.0000 mg | Freq: Two times a day (BID) | INTRAMUSCULAR | Status: AC
  Administered 2016-01-08 – 2016-01-09 (×2): 60 mg via INTRAVENOUS
  Filled 2016-01-08 (×2): qty 6

## 2016-01-08 MED ORDER — INSULIN ASPART 100 UNIT/ML ~~LOC~~ SOLN
0.0000 [IU] | SUBCUTANEOUS | Status: DC
  Administered 2016-01-08 – 2016-01-09 (×3): 1 [IU] via SUBCUTANEOUS
  Administered 2016-01-09: 2 [IU] via SUBCUTANEOUS
  Administered 2016-01-09 – 2016-01-10 (×7): 1 [IU] via SUBCUTANEOUS
  Administered 2016-01-10: 3 [IU] via SUBCUTANEOUS

## 2016-01-08 MED FILL — Sodium Chloride Irrigation Soln 0.9%: Qty: 3000 | Status: AC

## 2016-01-08 MED FILL — Sodium Chloride IV Soln 0.9%: INTRAVENOUS | Qty: 1000 | Status: AC

## 2016-01-08 MED FILL — Heparin Sodium (Porcine) Inj 1000 Unit/ML: INTRAMUSCULAR | Qty: 30 | Status: AC

## 2016-01-08 MED FILL — Thrombin For Soln 20000 Unit: CUTANEOUS | Qty: 1 | Status: AC

## 2016-01-08 NOTE — Progress Notes (Signed)
PT Cancellation Note  Patient Details Name: Ryan Cobb MRN: [id number] DOB: 03-14-1951   Cancelled Treatment:    Reason Eval/Treat Not Completed: Patient not medically ready.  Pt currently sedated on vent.  Will hold PT and mobility at this time.  Will f/u as appropriate.     Amberlyn Martinezgarcia, Thornton Papas 01/08/2016, 11:15 AM

## 2016-01-08 NOTE — Progress Notes (Signed)
  Echocardiogram 2D Echocardiogram has been performed.  Bobbye Charleston 01/08/2016, 10:31 AM

## 2016-01-08 NOTE — Progress Notes (Signed)
Initial Nutrition Assessment  DOCUMENTATION CODES:   Not applicable  INTERVENTION:   -If unable to extubate within 48 hours of intubation, recommend:  Initiate Vital High Protein @ 20 ml/hr via OGT and increase by 10 ml every 4 hours to goal rate of 50 ml/hr.   Tube feeding regimen provides 1200 kcal (64% of needs), 105 grams of protein, and 1003 ml of H2O.   At goal rate, pt to receive 1863 kcals (100% of estimated kcal need) with current propofol rate.   NUTRITION DIAGNOSIS:   Inadequate oral intake related to inability to eat as evidenced by NPO status.  GOAL:   Patient will meet greater than or equal to 90% of their needs  MONITOR:   Vent status, Labs, Weight trends, Skin, I & O's  REASON FOR ASSESSMENT:   Ventilator    ASSESSMENT:   65yo male with post op respiratory failure in setting upper airway edema after complicated cervical spine surgery   S/p Procedure(s) on 01/06/16: ANTERIOR CERVICAL DECOMPRESSION/DISCECTOMY FUSION 4 LEVELS (STAGE 1 OF 2)  S/p Procedure(s) on 01/07/16: PSF C3-T1 STAGE 2  Pt experienced post-op respiratory failure and was intubated for air swelling per ortho note. Per RT note, pt on back on full vent support due to apnea.   Patient is currently intubated on ventilator support. OGT in place MV: 10.2 L/min Temp (24hrs), Avg:98.6 F (37 C), Min:97.6 F (36.4 C), Max:99.4 F (37.4 C)  Propofol: 25.1 ml/hr (663 kcals)  Wt has been stable over the past year per wt hx. UBW 183#.   Unable to complete Nutrition-Focused physical exam at this time.   Labs reviewed: CBGS: 135-153.   Diet Order:  NPO  Skin:  Wound (see comment) (closed lt neck incisions)  Last BM:  PTA  Height:   Ht Readings from Last 1 Encounters:  12/29/15 5\' 7"  (1.702 m)    Weight:   Wt Readings from Last 1 Encounters:  01/06/16 184 lb (83.462 kg)    Ideal Body Weight:  67.3 kg  BMI:  Body mass index is 28.81 kg/(m^2).  Estimated Nutritional Needs:    Kcal:  1865.1  Protein:  105-120 grams  Fluid:  >1.8 L  EDUCATION NEEDS:   No education needs identified at this time  Gabryel Files A. Jimmye Norman, RD, LDN, CDE Pager: [phone] After hours Pager: [phone]

## 2016-01-08 NOTE — Progress Notes (Signed)
    Patient remained intubated overnight for airway swelling Patient has been stable overnight   Physical Exam: Filed Vitals:   01/08/16 0600 01/08/16 0700  BP: 139/84 137/85  Pulse: 95 88  Temp:    Resp: 15 15    Dressing and C collar in place Patient currently intubated and sedated  POD #1 s/p PSF C3-T1 and POD #2 s/p C3-7 ACDF  - ICU team to evaluate airway and potentially extubate if appropriate and safe to do so - once extubated, patient will need to be transferred and will need PT/OT - Percocet for pain, Valium for muscle spasms once extubated

## 2016-01-08 NOTE — Care Management (Signed)
Initial UR completed .  

## 2016-01-08 NOTE — Progress Notes (Signed)
OT Cancellation Note  Patient Details Name: Ryan Cobb MRN: [id number] DOB: October 05, 1951   Cancelled Treatment:    Reason Eval/Treat Not Completed: Medical issues which prohibited therapy. Pt remains intubated and sedated. Will continue to follow.  Malka So 01/08/2016, 11:28 AM

## 2016-01-08 NOTE — Consult Note (Signed)
PULMONARY / CRITICAL CARE MEDICINE   Name: ALA DESAUTEL MRN: [id number] DOB: 1951-07-30    ADMISSION DATE:  01/06/2016 CONSULTATION DATE:  01/07/2016  REFERRING MD:  Dr. Lynann Bologna  CHIEF COMPLAINT:  Post op vent  HISTORY OF PRESENT ILLNESS:   65 year old male with PMH as below, which includes HTN, basal cell adenoma, GERD, and cervical disc disorder. S/p ant and post cervical , with post extubation airway swelling in pacu.  SUBJECTIVE: rested on vent  VITAL SIGNS: BP 137/85 mmHg  Pulse 88  Temp(Src) 98.4 F (36.9 C) (Axillary)  Resp 15  Wt 83.462 kg (184 lb)  SpO2 98%  HEMODYNAMICS:    VENTILATOR SETTINGS: Vent Mode:  [-] PRVC FiO2 (%):  [40 %-50 %] 40 % Set Rate:  [12 bmp-14 bmp] 12 bmp Vt Set:  [530 mL] 530 mL PEEP:  [5 cmH20] 5 cmH20 Plateau Pressure:  [12 cmH20-18 cmH20] 16 cmH20  INTAKE / OUTPUT: I/O last 3 completed shifts: In: 7814.8 [I.V.:5114.8; Other:2400; IV Piggyback:300] Out: [id number] [Urine:5335; Blood:200]  PHYSICAL EXAMINATION: General:  sedated Neuro:  Sedated on vent, RASS -2, FC pos HEENT:  Mm moist, ETT, cervical collar, anterior cervical dressing clean Cardiovascular:  s1s2 rrr Lungs:  CTA reduced Abdomen:  Soft, wnl bs  Musculoskeletal:  Warm and dry, no edema   LABS:  BMET  Recent Labs Lab 01/07/16 1437 01/07/16 1715 01/08/16 0437  NA 133* 137 139  K 5.7* 3.4* 3.6  CL 98* 98* 101  CO2 27 30 30   BUN 9 6 7   CREATININE 0.72 0.80 0.93  GLUCOSE 147* 122* 168*    Electrolytes  Recent Labs Lab 01/07/16 1437 01/07/16 1715 01/08/16 0437  CALCIUM 7.9* 8.5* 8.5*    CBC  Recent Labs Lab 01/07/16 1437 01/08/16 0437  WBC 17.6* 18.1*  HGB 12.6* 13.6  HCT 36.4* 40.6  PLT 131* 186    Coag's No results for input(s): APTT, INR in the last 168 hours.  Sepsis Markers No results for input(s): LATICACIDVEN, PROCALCITON, O2SATVEN in the last 168 hours.  ABG  Recent Labs Lab 01/07/16 1440  PHART 7.413  PCO2ART 44.7   PO2ART 73.8*    Liver Enzymes No results for input(s): AST, ALT, ALKPHOS, BILITOT, ALBUMIN in the last 168 hours.  Cardiac Enzymes  Recent Labs Lab 01/07/16 1715 01/07/16 2304 01/08/16 0437  TROPONINI 0.03 0.05* 0.06*    Glucose  Recent Labs Lab 01/07/16 1922 01/07/16 2346 01/08/16 0346 01/08/16 0917  GLUCAP 113* 135* 164* 157*    Imaging Dg Cervical Spine 1 View  01/07/2016  CLINICAL DATA:  Cervical fusion at the C3 through T1 levels. EXAM: CERVICAL SPINE 1 VIEW COMPARISON:  Current and previous examinations. FINDINGS: A single posterior the C-arm view of the cervical spine demonstrates the anterior fusion hardware seen at the C3 through C6 levels and C7-T1 level on lateral radiographs earlier today. The current image also demonstrates pedicle screw and rod fixation spanning those levels. IMPRESSION: The interval pedicle screw and rod fixation at the C3 through T1 levels. Electronically Signed   By: Claudie Revering M.D.   On: 01/07/2016 12:16   Dg Cervical Spine 2 Or 3 Views  01/07/2016  CLINICAL DATA:  C3 through T1 cervical fusion. EXAM: CERVICAL SPINE - 2-3 VIEW COMPARISON:  Previous examinations, including the C-arm radiographs obtained earlier today. FINDINGS: Three lateral views of the cervical spine demonstrate and anterior screw and plate fusion at the C3 through C6 levels. Previously demonstrated anterior screw and plate  fusion at the C7-T1 level. Normal alignment on these images. IMPRESSION: Anterior cervical fusion at the C3 through C6 levels and at the C7-T1 level. Electronically Signed   By: Claudie Revering M.D.   On: 01/07/2016 12:13   Dg Chest Portable 1 View  01/08/2016  CLINICAL DATA:  Respiratory failure. EXAM: PORTABLE CHEST 1 VIEW COMPARISON:  01/07/2016. FINDINGS: 0606 hours. The heart size and mediastinal contours are stable. There are persistent low lung volumes with bibasilar atelectasis. Superimposed edema has improved. There is no pneumothorax or significant  pleural effusion. Endotracheal tube stable is unchanged in the mid trachea. Postsurgical changes are noted status post lower cervical fusion. IMPRESSION: Interval improvement in pulmonary edema. Stable bibasilar atelectasis and endotracheal tube position. Electronically Signed   By: Richardean Sale M.D.   On: 01/08/2016 08:19   Dg Chest Port 1 View  01/07/2016  CLINICAL DATA:  Intubated after cervical spinal fusion. Difficult intubation. EXAM: PORTABLE CHEST 1 VIEW COMPARISON:  12/29/2015 FINDINGS: Endotracheal tube terminates approximately 3.5 cm above the carina. The cardiac silhouette is within normal limits for size. Lung volumes are mildly diminished compared to the prior study with perihilar and basilar predominant interstitial and airspace opacities bilaterally. No sizable pleural effusion or pneumothorax is identified. No acute osseous abnormality is seen. IMPRESSION: 1. Endotracheal tube in satisfactory position. 2. Bilateral lung opacities which may reflect edema. Electronically Signed   By: Logan Bores M.D.   On: 01/07/2016 13:31   Dg C-arm 1-60 Min  01/07/2016  CLINICAL DATA:  Cervical fusion at the C3 through T1 levels. EXAM: CERVICAL SPINE 1 VIEW COMPARISON:  Current and previous examinations. FINDINGS: A single posterior the C-arm view of the cervical spine demonstrates the anterior fusion hardware seen at the C3 through C6 levels and C7-T1 level on lateral radiographs earlier today. The current image also demonstrates pedicle screw and rod fixation spanning those levels. IMPRESSION: The interval pedicle screw and rod fixation at the C3 through T1 levels. Electronically Signed   By: Claudie Revering M.D.   On: 01/07/2016 12:16     STUDIES:    CULTURES:   ANTIBIOTICS: Ancef (peri-op) 1/19>>>  SIGNIFICANT EVENTS: 1/18>> anterior cervical fusion  1/19>> posterior cervical fusion, post op respiratory failure, re-intubated   LINES/TUBES: ETT 1/19>>>  DISCUSSION: 65yo male with post  op respiratory failure in setting upper airway edema after complicated cervical spine surgery   ASSESSMENT / PLAN:  PULMONARY Acute respiratory failure  Upper airway edema/compromise - post anterior cervical surgery  Closed glottic edema likely P:   Decadron reduction after 4 doses Lasix improved pcxr Consider continued to neg balance Wean SBT/ WUA today, assess leak test Would not be suprised if he doesn't leak  CARDIOVASCULAR Hx HTN  No evidence ischemia P:  Hold home coreg, lisinopril for now  Propofol required Trop neg Echo awaited, had pulm edema on pcxr  RENAL No active issue  P:   Chem now and in am  Monitor UOP   GASTROINTESTINAL Hx GERD P:   NPO  PPI  If unable to extubate 1/20 consider TF   HEMATOLOGIC No active issue  P:  CBC now  SCD's   INFECTIOUS No active issue  P:   Monitor wbc, fever curve off abx  No role abx  ENDOCRINE On steroids P:   Monitor glucose on chem  Add officially SSI  NEUROLOGIC Post op pain control  Sedation requirements on vent  P:   RASS goal: -2 Propofol, PRN fentanyl , Mandatory  wua  ORTHO Left-sided cervical radiculopathy. Multilevel left-sided neural foraminal stenosis, C3-C7 -- s/p multi-phase cervical and posterior approach fusion 1/19 Status post previous C7-T1 fusion that did go onto a nonunion (2010)  P:  Per ortho  Cont c-collar  Pain control    FAMILY  - Updates:  Family updated by me 1/20  Ccm time 30 min   Lavon Paganini. Titus Mould, MD, [place] Pgr: [facility]

## 2016-01-08 NOTE — Progress Notes (Signed)
Placed back in full support due to Apnea.  Will try wean again as pt wakes more.

## 2016-01-09 ENCOUNTER — Inpatient Hospital Stay (HOSPITAL_COMMUNITY): Payer: No Typology Code available for payment source

## 2016-01-09 LAB — CBC WITH DIFFERENTIAL/PLATELET
Basophils Absolute: 0 10*3/uL (ref 0.0–0.1)
Basophils Relative: 0 %
EOS ABS: 0 10*3/uL (ref 0.0–0.7)
Eosinophils Relative: 0 %
HEMATOCRIT: 40.6 % (ref 39.0–52.0)
HEMOGLOBIN: 13.9 g/dL (ref 13.0–17.0)
LYMPHS ABS: 1 10*3/uL (ref 0.7–4.0)
Lymphocytes Relative: 5 %
MCH: 31.4 pg (ref 26.0–34.0)
MCHC: 34.2 g/dL (ref 30.0–36.0)
MCV: 91.6 fL (ref 78.0–100.0)
MONO ABS: 1.3 10*3/uL — AB (ref 0.1–1.0)
MONOS PCT: 6 %
NEUTROS ABS: 18.6 10*3/uL — AB (ref 1.7–7.7)
NEUTROS PCT: 89 %
Platelets: 202 10*3/uL (ref 150–400)
RBC: 4.43 MIL/uL (ref 4.22–5.81)
RDW: 13.2 % (ref 11.5–15.5)
WBC: 20.9 10*3/uL — ABNORMAL HIGH (ref 4.0–10.5)

## 2016-01-09 LAB — BASIC METABOLIC PANEL
Anion gap: 11 (ref 5–15)
BUN: 17 mg/dL (ref 6–20)
CALCIUM: 8.7 mg/dL — AB (ref 8.9–10.3)
CHLORIDE: 101 mmol/L (ref 101–111)
CO2: 32 mmol/L (ref 22–32)
CREATININE: 0.76 mg/dL (ref 0.61–1.24)
GFR calc non Af Amer: >60 mL/min (ref >60)
GLUCOSE: 159 mg/dL — AB (ref 65–99)
Potassium: 3.5 mmol/L (ref 3.5–5.1)
Sodium: 144 mmol/L (ref 135–145)

## 2016-01-09 LAB — GLUCOSE, CAPILLARY
GLUCOSE-CAPILLARY: 124 mg/dL — AB (ref 65–99)
GLUCOSE-CAPILLARY: 138 mg/dL — AB (ref 65–99)
GLUCOSE-CAPILLARY: 140 mg/dL — AB (ref 65–99)
GLUCOSE-CAPILLARY: 144 mg/dL — AB (ref 65–99)
Glucose-Capillary: 130 mg/dL — ABNORMAL HIGH (ref 65–99)
Glucose-Capillary: 135 mg/dL — ABNORMAL HIGH (ref 65–99)
Glucose-Capillary: 152 mg/dL — ABNORMAL HIGH (ref 65–99)

## 2016-01-09 LAB — PHOSPHORUS: Phosphorus: 2.7 mg/dL (ref 2.5–4.6)

## 2016-01-09 LAB — MAGNESIUM: Magnesium: 2.4 mg/dL (ref 1.7–2.4)

## 2016-01-09 MED ORDER — ANTISEPTIC ORAL RINSE SOLUTION (CORINZ)
7.0000 mL | Freq: Four times a day (QID) | OROMUCOSAL | Status: DC
  Administered 2016-01-09 – 2016-01-10 (×4): 7 mL via OROMUCOSAL

## 2016-01-09 MED ORDER — DEXAMETHASONE SODIUM PHOSPHATE 4 MG/ML IJ SOLN
4.0000 mg | Freq: Four times a day (QID) | INTRAMUSCULAR | Status: DC
  Administered 2016-01-09 – 2016-01-10 (×3): 4 mg via INTRAVENOUS
  Filled 2016-01-09 (×3): qty 1

## 2016-01-09 MED ORDER — MORPHINE SULFATE (PF) 2 MG/ML IV SOLN: 1.0000 mg | INTRAVENOUS | Status: DC | PRN

## 2016-01-09 NOTE — Progress Notes (Signed)
Pt extubated by Anesthesia team [id number] Pt is anxious to start eating RN called CCM, MD to see if standard protocol of restarting diet should be presumed 4-6 hours after extubation,  MD said to hold off on PO's until am after he has been re-evaluated. Pt has significant swelling from recent surgeries.   We will continue to monitor and report any abnormal findings.   Conall Vangorder GARNER]

## 2016-01-09 NOTE — Progress Notes (Signed)
PT Cancellation Note  Patient Details Name: Ryan Cobb MRN: [id number] DOB: March 05, 1951   Cancelled Treatment:    Reason Eval/Treat Not Completed: Patient not medically ready (Pt remains on vent.  Will hold mobility for now.  ).  PT will continue to follow acutely.  Joslyn Hy PT, DPT [phone] Pager: [phone] 01/09/2016, 8:44 AM

## 2016-01-09 NOTE — Progress Notes (Signed)
Patient extubated by anaesthesia, placed on 3LNC, no stridor heard, patient able to vocalize, SATS 97% on 3LNC, will continue to monitor patient.

## 2016-01-09 NOTE — Progress Notes (Signed)
PULMONARY / CRITICAL CARE MEDICINE   Name: NEEMA NAIR MRN: [id number] DOB: [birth year]    ADMISSION DATE:  01/06/2016 CONSULTATION DATE:  01/07/2016  REFERRING MD:  Dr. Lynann Bologna  CHIEF COMPLAINT:  Post op vent  HISTORY OF PRESENT ILLNESS:   65 year old male with PMH as below, which includes HTN, basal cell adenoma, GERD, and cervical disc disorder. S/p ant and post cervical , with post extubation airway swelling in pacu.    SUBJECTIVE/OVERNIGHT/INTERVAL HX 01/09/16 - on decadron since 01/07/16 - meets extubation criteria tehcnically . Cuff leak test positive. Wife reports significant stridor post op between 1st and 2nd surgeries when he was in room. Also reports positive cuff leak when he got reintubated in OR after 2nd surgery. Patient very keen on extubation. Wife and daughter nervous about risk for reintubation  Per wife hard c collar to stay on for 8 weeks  VITAL SIGNS: BP 153/83 mmHg  Pulse 71  Temp(Src) 97.3 F (36.3 C) (Axillary)  Resp 11  Wt 83.462 kg (184 lb)  SpO2 98%  HEMODYNAMICS:    VENTILATOR SETTINGS: Vent Mode:  [-] PSV FiO2 (%):  [40 %] 40 % Set Rate:  [12 bmp] 12 bmp Vt Set:  [530 mL] 530 mL PEEP:  [5 cmH20] 5 cmH20 Pressure Support:  [5 cmH20] 5 cmH20 Plateau Pressure:  [14 cmH20-16 cmH20] 16 cmH20  INTAKE / OUTPUT: I/O last 3 completed shifts: In: 1071.2 [I.V.:821.2; IV Piggyback:250] Out: [id number]  PHYSICAL EXAMINATION: General:  On vent PSV Neuro:  RASS 0. CAM-ICU neg for deliriu,. Good neuromusch strength. Writing HEENT:  Mm moist, ETT, cervical collar, anterior cervical dressing clean Cardiovascular:  s1s2 rrr Lungs:  CTA reduced Abdomen:  Soft, wnl bs  Musculoskeletal:  Warm and dry, no edema   LABS:  BMET  Recent Labs Lab 01/07/16 1715 01/08/16 0437 01/09/16 0435  NA 137 139 144  K 3.4* 3.6 3.5  CL 98* 101 101  CO2 30 30 32  BUN 6 7 17   CREATININE 0.80 0.93 0.76  GLUCOSE 122* 168* 159*     Electrolytes  Recent Labs Lab 01/07/16 1715 01/08/16 0437 01/09/16 0435  CALCIUM 8.5* 8.5* 8.7*  MG  --   --  2.4  PHOS  --   --  2.7    CBC  Recent Labs Lab 01/07/16 1437 01/08/16 0437 01/09/16 0435  WBC 17.6* 18.1* 20.9*  HGB 12.6* 13.6 13.9  HCT 36.4* 40.6 40.6  PLT 131* 186 202    Coag's No results for input(s): APTT, INR in the last 168 hours.  Sepsis Markers No results for input(s): LATICACIDVEN, PROCALCITON, O2SATVEN in the last 168 hours.  ABG  Recent Labs Lab 01/07/16 1440  PHART 7.413  PCO2ART 44.7  PO2ART 73.8*    Liver Enzymes No results for input(s): AST, ALT, ALKPHOS, BILITOT, ALBUMIN in the last 168 hours.  Cardiac Enzymes  Recent Labs Lab 01/07/16 1715 01/07/16 2304 01/08/16 0437  TROPONINI 0.03 0.05* 0.06*    Glucose  Recent Labs Lab 01/08/16 1542 01/08/16 1924 01/08/16 2347 01/09/16 0435 01/09/16 0747 01/09/16 1209  GLUCAP 115* 138* 130* 152* 144* 138*    Imaging Dg Chest Port 1 View  01/09/2016  CLINICAL DATA:  Pulmonary edema. EXAM: PORTABLE CHEST 1 VIEW COMPARISON:  01/08/2016 FINDINGS: Endotracheal tube is stable in position. Postsurgical changes from lower cervical spine fusion are again seen. Cardiomediastinal silhouette is normal. Mediastinal contours appear intact. There is no evidence of pleural effusion or pneumothorax. There are  low lung volumes with elevation of the left hemidiaphragm, and persistent left more than right basilar atelectasis. Mild pulmonary vascular congestion. Osseous structures are without acute abnormality. Soft tissues are grossly normal. IMPRESSION: Slight worsening in bibasilar atelectasis with elevation of the left hemidiaphragm. Mild pulmonary vascular congestion. Stable position of the endotracheal tube. Electronically Signed   By: Fidela Salisbury M.D.   On: 01/09/2016 09:41     STUDIES:    CULTURES:   ANTIBIOTICS: Ancef (peri-op) 1/19>>>  SIGNIFICANT EVENTS: 1/18>>  anterior cervical fusion  1/19>> posterior cervical fusion, post op respiratory failure, re-intubated   LINES/TUBES: ETT 1/19>>>  DISCUSSION: 65yo male with post op respiratory failure in setting upper airway edema after complicated cervical spine surgery   ASSESSMENT / PLAN:  PULMONARY Acute respiratory failure  Upper airway edema/compromise - post anterior cervical surgery  Closed glottic edema likely    - technically meets extubation criteria but at high risk for reintubation. HAs C -collar hard and is probably not a canddiate for emergent trach. Cuff leak was false positive per wife in OR 2 days ago. Has completed 2d decadron   P:   Decadron reduced  - 01/09/16 Extubation assessment - by anesthesia - might need it in OR - high risk reintubation   CARDIOVASCULAR Hx HTN  No evidence ischemia      Recent Labs Lab 01/07/16 1715 01/07/16 2304 01/08/16 0437  TROPONINI 0.03 0.05* 0.06*    Echo - normal but for gr 1 diast dysfn  P:  Hold home coreg, lisinopril for now  Dc diprivan  RENAL No active issue  P:   Chem now and in am  Monitor UOP   GASTROINTESTINAL Hx GERD P:   NPO  PPI  If unable to extubate 1/22 consider TF   HEMATOLOGIC No active issue  P:  CBC now  SCD's   INFECTIOUS No active issue  P:   Monitor wbc, fever curve off abx  No role abx  ENDOCRINE On steroids P:   Monitor glucose on chem  Add officially SSI  NEUROLOGIC Post op pain control  Sedation requirements on vent  - does not seem to need diprivan  P:   RASS goal: 0 Dc Propofol, Start precedex if needed and not extubated  PRN fentanyl , Mandatory wua  ORTHO Left-sided cervical radiculopathy. Multilevel left-sided neural foraminal stenosis, C3-C7 -- s/p multi-phase cervical and posterior approach fusion 1/19 Status post previous C7-T1 fusion that did go onto a nonunion (2010)  P:  Per ortho  Cont c-collar x 8 weeks from 01/06/2016 Pain control    FAMILY  -  Updates:  Family updated by PCCM MD 1/20 and 01/09/16. Wife nervous about extubation     The patient is critically ill with multiple organ systems failure and requires high complexity decision making for assessment and support, frequent evaluation and titration of therapies, application of advanced monitoring technologies and extensive interpretation of multiple databases.   Critical Care Time devoted to patient care services described in this note is  30  Minutes. This time reflects time of care of this signee Dr Brand Males. This critical care time does not reflect procedure time, or teaching time or supervisory time of PA/NP/Med student/Med Resident etc but could involve care discussion time    Dr. Brand Males, M.D., [facility].C.P Pulmonary and Critical Care Medicine Staff Physician [place] [facility] Pager: [phone], If no answer or between  15:00h - 7:00h: call [phone]  [phone]  01/09/2016 1:46 PM

## 2016-01-09 NOTE — Progress Notes (Signed)
   PATIENT ID: Ryan Cobb   2 Days Post-Op Procedure(s) (LRB): POSTERIOR CERVICAL FUSION LEVEL 5 (N/A)  Subjective: Weaning patient this morning and so far tolerating it well. No questions per patient. Pain minimal. 2d echo performed, no results in yet.   Objective:  Filed Vitals:   01/09/16 0752 01/09/16 0800  BP:  128/59  Pulse:  69  Temp: 96.9 F (36.1 C)   Resp:  14     Dressing and C collar in place Currently intubated, lightly sedated  Labs:   Recent Labs  01/07/16 1437 01/08/16 0437 01/09/16 0435  HGB 12.6* 13.6 13.9   Recent Labs  01/08/16 0437 01/09/16 0435  WBC 18.1* 20.9*  RBC 4.43 4.43  HCT 40.6 40.6  PLT 186 202   Recent Labs  01/08/16 0437 01/09/16 0435  NA 139 144  K 3.6 3.5  CL 101 101  CO2 30 32  BUN 7 17  CREATININE 0.93 0.76  GLUCOSE 168* 159*  CALCIUM 8.5* 8.7*    Assessment and Plan: POD #2 s/p PSF C3-T1 and POD #2 s/p C3-7 ACDF  - critical care, respiratory care plan to ween off and potentially extubate if appropriate and safe to do so - once extubated, patient will need to be transferred and will need PT/OT - Percocet for pain, Valium for muscle spasms once extubated

## 2016-01-09 NOTE — Progress Notes (Signed)
Patient c recent ant/post fusion . Required reintubation early. Now at bedside. Awake and alert, good tidal volumes, good leak around tube.  Extubated to nasal cannula O2

## 2016-01-09 NOTE — Progress Notes (Signed)
Placed patient on SBT 5/5 40% patient tolerated well

## 2016-01-10 LAB — GLUCOSE, CAPILLARY
GLUCOSE-CAPILLARY: 131 mg/dL — AB (ref 65–99)
GLUCOSE-CAPILLARY: 234 mg/dL — AB (ref 65–99)
Glucose-Capillary: 147 mg/dL — ABNORMAL HIGH (ref 65–99)

## 2016-01-10 MED ORDER — PANTOPRAZOLE SODIUM 40 MG PO TBEC
40.0000 mg | DELAYED_RELEASE_TABLET | Freq: Every day | ORAL | Status: DC
  Administered 2016-01-10: 40 mg via ORAL
  Filled 2016-01-10: qty 1

## 2016-01-10 MED ORDER — DEXAMETHASONE 2 MG PO TABS
2.0000 mg | ORAL_TABLET | Freq: Four times a day (QID) | ORAL | Status: DC
  Administered 2016-01-10: 2 mg via ORAL
  Filled 2016-01-10: qty 1

## 2016-01-10 MED ORDER — CARVEDILOL 3.125 MG PO TABS
3.1250 mg | ORAL_TABLET | Freq: Two times a day (BID) | ORAL | Status: DC
  Administered 2016-01-10: 3.125 mg via ORAL
  Filled 2016-01-10: qty 1

## 2016-01-10 MED ORDER — DEXAMETHASONE SODIUM PHOSPHATE 4 MG/ML IJ SOLN: 2.0000 mg | Freq: Four times a day (QID) | INTRAMUSCULAR | Status: DC

## 2016-01-10 NOTE — Plan of Care (Signed)
Problem: Activity: Goal: Ability to tolerate increased activity will improve Outcome: Completed/Met Date Met:  01/10/16 Pt has ambulated around the unit and to the restroom several times this am without any complications.

## 2016-01-10 NOTE — Evaluation (Signed)
Physical Therapy Evaluation Patient Details Name: Ryan Cobb MRN: [id number] DOB: 05-03-1951 Today's Date: 01/10/2016   History of Present Illness  Pt is a 65 y/o M who presented w/ ongoing pain in neck and Lt arm, CT scan revealed a nonunion at C7/T1 as well as stenosis C3-7.  Pt underwent anterior cervical fusion on 01/06/16 and posterior cervical fusion 01/07/16 w/ post op respiratory failure and was re-intubated.  Extubated 01/09/16.  Pt's PMH includes cervical fusion in 2010, Lt TKA.  Clinical Impression  Patient is s/p above surgery resulting in functional limitations due to the deficits listed below (see PT Problem List). Mr. Lapietra will have 24/7 assist/supervision at home but has steps to enter his home.  He currently requires very close min guard assist for safe ambulation and min>mod assist for bed mobility. Patient will benefit from skilled PT to increase their independence and safety with mobility to allow discharge to the venue listed below.      Follow Up Recommendations Home health PT;Supervision for mobility/OOB    Equipment Recommendations  Rolling walker with 5" wheels;3in1 (PT)    Recommendations for Other Services OT consult     Precautions / Restrictions Precautions Precautions: Cervical;Fall Precaution Comments: provided pt w/ cervical precaution sheet and reviewed precautions Required Braces or Orthoses: Cervical Brace Cervical Brace: Hard collar;At all times Restrictions Weight Bearing Restrictions: No      Mobility  Bed Mobility Overal bed mobility: Needs Assistance Bed Mobility: Rolling;Sidelying to Sit Rolling: Min assist Sidelying to sit: Mod assist;HOB elevated       General bed mobility comments: Cues for log roll technique and assist to boost up from sidelying.  Transfers Overall transfer level: Needs assistance Equipment used: None Transfers: Sit to/from Stand Sit to Stand: Min guard         General transfer comment: Very close min  guard assist as pt is initially unsteady upon standing.  +dizziness which subsides within 60 seconds  Ambulation/Gait Ambulation/Gait assistance: Min assist Ambulation Distance (Feet): 80 Feet Assistive device: None Gait Pattern/deviations: Step-through pattern;Decreased stride length   Gait velocity interpretation: Below normal speed for age/gender General Gait Details: Distance limited by Rt platar fascitis pain (daughter to bring orthotic), min assist to steady x1 due to pain; otherwise, close min guard assist w/ cues too look up where he is walking.  Stairs            Wheelchair Mobility    Modified Rankin (Stroke Patients Only)       Balance Overall balance assessment: Needs assistance Sitting-balance support: Bilateral upper extremity supported;Feet supported Sitting balance-Leahy Scale: Fair     Standing balance support: No upper extremity supported;During functional activity Standing balance-Leahy Scale: Fair                               Pertinent Vitals/Pain Pain Assessment: 0-10 Pain Score: 5  Pain Location: posterior neck and Rt foot plantar fasciitis  Pain Descriptors / Indicators: Aching;Sharp Pain Intervention(s): Limited activity within patient's tolerance;Monitored during session;Repositioned    Home Living Family/patient expects to be discharged to:: Private residence Living Arrangements: Spouse/significant other Available Help at Discharge: Family;Available 24 hours/day (wife) Type of Home: House Home Access: Stairs to enter Entrance Stairs-Rails: None Entrance Stairs-Number of Steps: 5 Home Layout: Two level;Able to live on main level with bedroom/bathroom (his bedroom is upstairs) Home Equipment: None      Prior Function Level of Independence: Independent  Hand Dominance   Dominant Hand: Right    Extremity/Trunk Assessment   Upper Extremity Assessment: Defer to OT evaluation           Lower  Extremity Assessment: RLE deficits/detail RLE Deficits / Details: h/o Rt plantar fasciitis, wears orthotic in shoes    Cervical / Trunk Assessment: Other exceptions  Communication   Communication: No difficulties  Cognition Arousal/Alertness: Awake/alert Behavior During Therapy: WFL for tasks assessed/performed Overall Cognitive Status: Within Functional Limits for tasks assessed                      General Comments General comments (skin integrity, edema, etc.): Has orthotic for plantar fasciitis which his daughter will be bringing hopefully before next PT session.    Exercises        Assessment/Plan    PT Assessment Patient needs continued PT services  PT Diagnosis Difficulty walking;Acute pain   PT Problem List Decreased strength;Decreased range of motion;Decreased activity tolerance;Decreased balance;Decreased knowledge of use of DME;Decreased safety awareness;Pain;Decreased knowledge of precautions  PT Treatment Interventions DME instruction;Gait training;Stair training;Functional mobility training;Therapeutic activities;Therapeutic exercise;Balance training;Neuromuscular re-education;Patient/family education;Modalities   PT Goals (Current goals can be found in the Care Plan section) Acute Rehab PT Goals Patient Stated Goal: to get stronger and go home PT Goal Formulation: With patient Time For Goal Achievement: 01/21/16 Potential to Achieve Goals: Good    Frequency Min 5X/week   Barriers to discharge Inaccessible home environment steps to enter home    Co-evaluation               End of Session Equipment Utilized During Treatment: Gait belt Activity Tolerance: Patient limited by pain;Patient tolerated treatment well Patient left: in chair;with call bell/phone within reach Nurse Communication: Mobility status;Precautions;Other (comment) (Rt foot pain)         Time: [id number] PT Time Calculation (min) (ACUTE ONLY): 24 min   Charges:   PT  Evaluation $PT Eval Moderate Complexity: 1 Procedure     PT G Codes:       Joslyn Hy PT, DPT [phone] Pager: [phone] 01/10/2016, 1:49 PM

## 2016-01-10 NOTE — Progress Notes (Signed)
Patient has not wanted to receive pain medication throughout the night.

## 2016-01-10 NOTE — Progress Notes (Signed)
   PATIENT ID: Ryan Cobb   3 Days Post-Op Procedure(s) (LRB): POSTERIOR CERVICAL FUSION LEVEL 5 (N/A)  Subjective: Doing very well this morning. Extubated last night and observed without problems for 12 hrs. Eating soft foods. Minimal pain, not taking pain medication. Denies upper extremity symptoms. Eager to get up with PT/OT.  Objective:  Filed Vitals:   01/10/16 0800 01/10/16 0900  BP: 179/89 149/84  Pulse: 58 49  Temp: 97.3 F (36.3 C)   Resp: 15 11     Dressing and C collar in place Upper extremities neurovascularly intact  Labs:   Recent Labs  01/07/16 1437 01/08/16 0437 01/09/16 0435  HGB 12.6* 13.6 13.9   Recent Labs  01/08/16 0437 01/09/16 0435  WBC 18.1* 20.9*  RBC 4.43 4.43  HCT 40.6 40.6  PLT 186 202   Recent Labs  01/08/16 0437 01/09/16 0435  NA 139 144  K 3.6 3.5  CL 101 101  CO2 30 32  BUN 7 17  CREATININE 0.93 0.76  GLUCOSE 168* 159*  CALCIUM 8.5* 8.7*    Assessment and Plan: POD #3 s/p PSF C3-T1 and POD #4 s/p C3-7 ACDF  - extubation successful and critical care signed off - advance diet as tolerated - transfer orders in for 5N - up with PT/OT today - continue hard collar - critical care reduced decadron dose this am, will taper and d/c next 48 hours - Percocet for pain, Valium for muscle spasms if needed, doing well off pain rx for now

## 2016-01-10 NOTE — Progress Notes (Signed)
Pt discharged from 36M via wheelchair to the main entrance, accompanied by wife, daughter, and RN. All vital signs were stable at time of discharge. All questions were answered, all belongings returned. Wife and patient both verbalized understanding of AVS and discharge instructions.

## 2016-01-10 NOTE — Progress Notes (Signed)
PULMONARY / CRITICAL CARE MEDICINE   Name: Ryan Cobb MRN: [id number] DOB: Apr 07, 1951    ADMISSION DATE:  01/06/2016 CONSULTATION DATE:  01/07/2016  REFERRING MD:  Dr. Lynann Bologna  CHIEF COMPLAINT:  Post op vent  HISTORY OF PRESENT ILLNESS:   65 year old male with PMH as below, which includes HTN, basal cell adenoma, GERD, and cervical disc disorder. S/p ant and post cervical , with post extubation airway swelling in pacu.    SUBJECTIVE/OVERNIGHT/INTERVAL HX 01/09/16 - on decadron since 01/07/16 - meets extubation criteria tehcnically . Cuff leak test positive. Wife reports significant stridor post op between 1st and 2nd surgeries when he was in room. Also reports positive cuff leak when he got reintubated in OR after 2nd surgery. Patient very keen on extubation. Wife and daughter nervous about risk for reintubation  Per wife hard c collar to stay on for 8 weeks  VITAL SIGNS: BP 151/76 mmHg  Pulse 49  Temp(Src) 97.4 F (36.3 C) (Oral)  Resp 16  Wt 184 lb (83.462 kg)  SpO2 100%  2 liters  INTAKE / OUTPUT: I/O last 3 completed shifts: In: 502 [I.V.:502] Out: 2445 [Urine:2445]  PHYSICAL EXAMINATION: General:  Awake, alert, no focal def  Neuro: awake, oriented Good neuromusch strength. HEENT:  Mm moist, ETT, cervical collar, anterior cervical dressing clean Cardiovascular:  s1s2 rrr Lungs:  CTA reduced Abdomen:  Soft, wnl bs  Musculoskeletal:  Warm and dry, no edema   LABS:  BMET  Recent Labs Lab 01/07/16 1715 01/08/16 0437 01/09/16 0435  NA 137 139 144  K 3.4* 3.6 3.5  CL 98* 101 101  CO2 30 30 32  BUN 6 7 17   CREATININE 0.80 0.93 0.76  GLUCOSE 122* 168* 159*    Electrolytes  Recent Labs Lab 01/07/16 1715 01/08/16 0437 01/09/16 0435  CALCIUM 8.5* 8.5* 8.7*  MG  --   --  2.4  PHOS  --   --  2.7    CBC  Recent Labs Lab 01/07/16 1437 01/08/16 0437 01/09/16 0435  WBC 17.6* 18.1* 20.9*  HGB 12.6* 13.6 13.9  HCT 36.4* 40.6 40.6  PLT 131* 186  202    Coag's No results for input(s): APTT, INR in the last 168 hours.  Sepsis Markers No results for input(s): LATICACIDVEN, PROCALCITON, O2SATVEN in the last 168 hours.  ABG  Recent Labs Lab 01/07/16 1440  PHART 7.413  PCO2ART 44.7  PO2ART 73.8*    Liver Enzymes No results for input(s): AST, ALT, ALKPHOS, BILITOT, ALBUMIN in the last 168 hours.  Cardiac Enzymes  Recent Labs Lab 01/07/16 1715 01/07/16 2304 01/08/16 0437  TROPONINI 0.03 0.05* 0.06*    Glucose  Recent Labs Lab 01/09/16 1209 01/09/16 1542 01/09/16 1937 01/09/16 2347 01/10/16 0308 01/10/16 0744  GLUCAP 138* 135* 140* 124* 147* 131*    Imaging No results found.   STUDIES:    CULTURES:   ANTIBIOTICS: Ancef (peri-op) 1/19>>>  SIGNIFICANT EVENTS: 1/18>> anterior cervical fusion  1/19>> posterior cervical fusion, post op respiratory failure, re-intubated   LINES/TUBES: ETT 1/19>>>1/21  DISCUSSION: 65yo male with post op respiratory failure in setting upper airway edema after complicated cervical spine surgery   ASSESSMENT / PLAN:  Resolved Acute respiratory failure d/t Upper airway edema/compromise - post anterior cervical surgery  Closed glottic edema likely. Extubated 1/21 by anesthesia.  Plan Adv diet Wean O2 Mobilize  Steroids per surg   Left-sided cervical radiculopathy. Multilevel left-sided neural foraminal stenosis, C3-C7 -- s/p multi-phase cervical and posterior  approach fusion 1/19 Status post previous C7-T1 fusion that did go onto a nonunion (2010)  Plan:  Per ortho  Cont c-collar x 8  adv activity Adv diet   H/o HTN Plan Echo - normal but for gr 1 diast dysfn P:  Resume Coreg, then  lisinopril next 24-48hrs (if pressure requires)  Hyperglycemia  Plan:   Monitor glucose on chem   FAMILY  - Updates:  Family updated by PCCM MD 1/20 and 01/09/16. Wife nervous about extubation  OK to move out of ICU  Erick Colace ACNP-BC [place] Pager # [phone] OR # [phone] if no answer   01/10/2016 8:33 AM

## 2016-01-10 NOTE — Plan of Care (Signed)
Problem: Activity: Goal: Will remain free from falls Outcome: Completed/Met Date Met:  01/10/16 Pt to be discharged within the hour and has had no falls during his hospitalization.   Problem: Education: Goal: Understanding of discharge needs will improve Outcome: Completed/Met Date Met:  01/10/16 Pt was able to speak with Dr. Lynann Bologna over the phone about discharge plans and instructions.   Problem: Pain Management: Goal: Pain level will decrease Outcome: Completed/Met Date Met:  01/10/16 Pt reports no pain at this time and has not required medication for over 24 hrs to help control pain.  Problem: Skin Integrity: Goal: Signs of wound healing will improve Outcome: Adequate for Discharge Wound is covered with surgical dressing, no drainage obvious. MD gave pt instructions on wound care management at home over the phone.

## 2016-01-18 ENCOUNTER — Encounter (HOSPITAL_COMMUNITY): Payer: Self-pay | Admitting: Orthopedic Surgery

## 2016-01-25 NOTE — Discharge Summary (Signed)
Patient ID: RAY TOYAMA MRN: [id number] DOB/AGE: 66-Dec-1952 66 y.o.  Admit date: 01/06/2016 Discharge date: 01/10/2016  Admission Diagnoses:  Active Problems:   Radiculopathy   Difficult intubation   Elective surgery   Respiratory failure [facility])   Pulmonary edema   Discharge Diagnoses:  Same  Past Medical History  Diagnosis Date  . Hyperlipidemia   . Hypertension   . Seasonal rhinitis     Flonase as needed; Singulair daily  . Colon polyps 2006    hyperplastic  . Basal cell adenoma     history of  . Arthritis   . GERD (gastroesophageal reflux disease)   . Cervical disc disorder   . Difficult intubation     Surgeries: Procedure(s): ACDF C3-7, POSTERIOR CERVICAL FUSION LEVEL 5 on 01/06/2016 - 01/07/2016   Consultants: Critical Care team for airway  Discharged Condition: Improved  Hospital Course: ROBBERT BENDALL is an 65 y.o. male who was admitted 01/06/2016 for operative treatment of radiculopathy. Patient has severe unremitting pain that affects sleep, daily activities, and work/hobbies. After pre-op clearance the patient was taken to the operating room on 01/06/2016 - 01/07/2016 and underwent  Procedure(s): ACDF C3-C7, POSTERIOR CERVICAL FUSION LEVEL 5 C3-T1.    Patient was given perioperative antibiotics:  Anti-infectives    Start     Dose/Rate Route Frequency Ordered Stop   01/07/16 1530  ceFAZolin (ANCEF) IVPB 1 g/50 mL premix     1 g 100 mL/hr over 30 Minutes Intravenous Every 8 hours 01/07/16 1519 01/08/16 0046   01/06/16 1930  ceFAZolin (ANCEF) IVPB 1 g/50 mL premix     1 g 100 mL/hr over 30 Minutes Intravenous Every 8 hours 01/06/16 1836 01/07/16 0400   01/06/16 0709  ceFAZolin (ANCEF) IVPB 2 g/50 mL premix     2 g 100 mL/hr over 30 Minutes Intravenous On call to O.R. 01/06/16 0709 01/06/16 1225       Patient was given sequential compression devices, early ambulation to prevent DVT.  Patient benefited maximally from hospital stay and there was  complication of compromised airway secondary to swelling after day 2 surgery PCF. Pt was intubated and transferred to ICU by CC Team and given decadron, was extubated and recovered well d/c'd w/o difficulty.   Recent vital signs: BP 130/79 mmHg  Pulse 69  Temp(Src) 97.6 F (36.4 C) (Oral)  Resp 20  Wt 83.462 kg (184 lb)  SpO2 97%  Discharge Medications:     Medication List    STOP taking these medications        aspirin 81 MG tablet      TAKE these medications        b complex vitamins tablet  Take 1 tablet by mouth daily.     calcium carbonate 600 MG Tabs tablet  Commonly known as:  OS-CAL  Take 600 mg by mouth daily.     carvedilol 6.25 MG tablet  Commonly known as:  COREG  1/2 bid     CENTRUM SILVER PO  Take 1 tablet by mouth daily.     OCUVITE EYE + MULTI PO  Take 1 tablet by mouth daily.     Co Q 10 10 MG Caps  Take 10 mg by mouth daily.     ESTER C PO  Take 500 mg by mouth daily.     fluticasone 50 MCG/ACT nasal spray  Commonly known as:  FLONASE  Place 1 spray into the nose 2 (two) times daily.  Garlic 123XX123 MG Caps  Take 1,000 mg by mouth daily.     LECITHIN PO  Take 1,200 mg by mouth daily.     lisinopril-hydrochlorothiazide 20-25 MG tablet  Commonly known as:  PRINZIDE,ZESTORETIC  TAKE 1 TABLET EACH DAY.     loratadine 10 MG tablet  Commonly known as:  CLARITIN  Take 10 mg by mouth daily.     Magnesium 250 MG Tabs  Take 250 mg by mouth daily.     montelukast 10 MG tablet  Commonly known as:  SINGULAIR  Take 10 mg by mouth daily.     Omega 3 1200 MG Caps  Take 1,200 mg by mouth daily.     Vitamin D3 3000 units Tabs  Take by mouth. Reported on 12/23/2015     cholecalciferol 1000 units tablet  Commonly known as:  VITAMIN D  Take 5,000 Units by mouth 4 (four) times a week.        Diagnostic Studies: Dg Chest 2 View  12/29/2015  CLINICAL DATA:  Preoperative respiratory evaluation for cervical fusion. EXAM: CHEST  2 VIEW  COMPARISON:  08/14/2008 FINDINGS: The lungs are clear wiithout focal pneumonia, edema, pneumothorax or pleural effusion. The cardiopericardial silhouette is within normal limits for size. Patient has had interval resection of the distal right clavicle. Fusion hardware noted in the lower cervical spine/ cervicothoracic junction. IMPRESSION: No active cardiopulmonary disease. Electronically Signed   By: Misty Stanley M.D.   On: 12/29/2015 09:35   Dg Cervical Spine 1 View  01/07/2016  CLINICAL DATA:  Cervical fusion at the C3 through T1 levels. EXAM: CERVICAL SPINE 1 VIEW COMPARISON:  Current and previous examinations. FINDINGS: A single posterior the C-arm view of the cervical spine demonstrates the anterior fusion hardware seen at the C3 through C6 levels and C7-T1 level on lateral radiographs earlier today. The current image also demonstrates pedicle screw and rod fixation spanning those levels. IMPRESSION: The interval pedicle screw and rod fixation at the C3 through T1 levels. Electronically Signed   By: Claudie Revering M.D.   On: 01/07/2016 12:16   Dg Cervical Spine 2 Or 3 Views  01/07/2016  CLINICAL DATA:  C3 through T1 cervical fusion. EXAM: CERVICAL SPINE - 2-3 VIEW COMPARISON:  Previous examinations, including the C-arm radiographs obtained earlier today. FINDINGS: Three lateral views of the cervical spine demonstrate and anterior screw and plate fusion at the C3 through C6 levels. Previously demonstrated anterior screw and plate fusion at the [id number] level. Normal alignment on these images. IMPRESSION: Anterior cervical fusion at the C3 through C6 levels and at the C7-T1 level. Electronically Signed   By: Claudie Revering M.D.   On: 01/07/2016 12:13   Dg Cervical Spine 2-3 Views  01/06/2016  CLINICAL DATA:  65 year old male undergoing ACDF. Initial encounter. EXAM: CERVICAL SPINE - 2-3 VIEW; DG C-ARM 61-120 MIN COMPARISON:  CT cervical spine 11/18/2015. FINDINGS: 2 intraoperative fluoroscopic views of the  cervical spine in the frontal and lateral projections. Preexisting C7-T1 ACDF hardware. ACDF hardware now in place from the C3 to the C7 level. IMPRESSION: Cervical spine ACDF extended from the C3 to the C7 level. Electronically Signed   By: Genevie Ann M.D.   On: 01/06/2016 12:21   Dg Chest Port 1 View  01/09/2016  CLINICAL DATA:  Pulmonary edema. EXAM: PORTABLE CHEST 1 VIEW COMPARISON:  01/08/2016 FINDINGS: Endotracheal tube is stable in position. Postsurgical changes from lower cervical spine fusion are again seen. Cardiomediastinal silhouette is normal. Mediastinal contours  appear intact. There is no evidence of pleural effusion or pneumothorax. There are low lung volumes with elevation of the left hemidiaphragm, and persistent left more than right basilar atelectasis. Mild pulmonary vascular congestion. Osseous structures are without acute abnormality. Soft tissues are grossly normal. IMPRESSION: Slight worsening in bibasilar atelectasis with elevation of the left hemidiaphragm. Mild pulmonary vascular congestion. Stable position of the endotracheal tube. Electronically Signed   By: Fidela Salisbury M.D.   On: 01/09/2016 09:41   Dg Chest Portable 1 View  01/08/2016  CLINICAL DATA:  Respiratory failure. EXAM: PORTABLE CHEST 1 VIEW COMPARISON:  01/07/2016. FINDINGS: 0606 hours. The heart size and mediastinal contours are stable. There are persistent low lung volumes with bibasilar atelectasis. Superimposed edema has improved. There is no pneumothorax or significant pleural effusion. Endotracheal tube stable is unchanged in the mid trachea. Postsurgical changes are noted status post lower cervical fusion. IMPRESSION: Interval improvement in pulmonary edema. Stable bibasilar atelectasis and endotracheal tube position. Electronically Signed   By: Richardean Sale M.D.   On: 01/08/2016 08:19   Dg Chest Port 1 View  01/07/2016  CLINICAL DATA:  Intubated after cervical spinal fusion. Difficult intubation. EXAM:  PORTABLE CHEST 1 VIEW COMPARISON:  12/29/2015 FINDINGS: Endotracheal tube terminates approximately 3.5 cm above the carina. The cardiac silhouette is within normal limits for size. Lung volumes are mildly diminished compared to the prior study with perihilar and basilar predominant interstitial and airspace opacities bilaterally. No sizable pleural effusion or pneumothorax is identified. No acute osseous abnormality is seen. IMPRESSION: 1. Endotracheal tube in satisfactory position. 2. Bilateral lung opacities which may reflect edema. Electronically Signed   By: Logan Bores M.D.   On: 01/07/2016 13:31   Dg C-arm 1-60 Min  01/07/2016  CLINICAL DATA:  Cervical fusion at the C3 through T1 levels. EXAM: CERVICAL SPINE 1 VIEW COMPARISON:  Current and previous examinations. FINDINGS: A single posterior the C-arm view of the cervical spine demonstrates the anterior fusion hardware seen at the C3 through C6 levels and C7-T1 level on lateral radiographs earlier today. The current image also demonstrates pedicle screw and rod fixation spanning those levels. IMPRESSION: The interval pedicle screw and rod fixation at the C3 through T1 levels. Electronically Signed   By: Claudie Revering M.D.   On: 01/07/2016 12:16   Dg C-arm 1-60 Min  01/06/2016  CLINICAL DATA:  65 year old male undergoing ACDF. Initial encounter. EXAM: CERVICAL SPINE - 2-3 VIEW; DG C-ARM 61-120 MIN COMPARISON:  CT cervical spine 11/18/2015. FINDINGS: 2 intraoperative fluoroscopic views of the cervical spine in the frontal and lateral projections. Preexisting C7-T1 ACDF hardware. ACDF hardware now in place from the C3 to the C7 level. IMPRESSION: Cervical spine ACDF extended from the C3 to the C7 level. Electronically Signed   By: Genevie Ann M.D.   On: 01/06/2016 12:21    Disposition: 01-Home or Self Care      Discharge Instructions    Call MD / Call 911    Complete by:  As directed   If you experience chest pain or shortness of breath, CALL 911 and be  transported to the hospital emergency room.  If you develope a fever above 101 F, pus (white drainage) or increased drainage or redness at the wound, or calf pain, call your surgeon's office.     Constipation Prevention    Complete by:  As directed   Drink plenty of fluids.  Prune juice may be helpful.  You may use a stool softener, such  as Colace (over the counter) 100 mg twice a day.  Use MiraLax (over the counter) for constipation as needed.     Diet - low sodium heart healthy    Complete by:  As directed      Increase activity slowly as tolerated    Complete by:  As directed           POD #3 s/p PSF C3-T1 and POD #4 s/p C3-7 ACDF  - extubation successful and critical care signed off - advance diet as tolerated - transfer orders in for 5N - up with PT/OT today - continue hard collar - critical care reduced decadron dose this am, will taper and d/c next 48 hours - Percocet for pain, Valium for muscle spasms if needed, doing well off pain rx for now -Written scripts for pain signed and in chart -D/C instructions sheet printed and in chart -D/C today  -F/U in office 2 weeks   Signed: Justice Britain 01/25/2016, 5:04 PM

## 2016-01-28 ENCOUNTER — Other Ambulatory Visit: Payer: Self-pay | Admitting: Internal Medicine

## 2016-03-19 ENCOUNTER — Other Ambulatory Visit: Payer: Self-pay | Admitting: Internal Medicine

## 2016-07-06 ENCOUNTER — Ambulatory Visit (INDEPENDENT_AMBULATORY_CARE_PROVIDER_SITE_OTHER): Payer: No Typology Code available for payment source | Admitting: Internal Medicine

## 2016-07-06 ENCOUNTER — Encounter: Payer: Self-pay | Admitting: Internal Medicine

## 2016-07-06 VITALS — BP 134/86 | HR 62 | Temp 98.2°F | Resp 16 | Wt 175.0 lb

## 2016-07-06 DIAGNOSIS — R1012 Left upper quadrant pain: Secondary | ICD-10-CM | POA: Insufficient documentation

## 2016-07-06 NOTE — Assessment & Plan Note (Signed)
LUQ pain x few months - worse with sitting, no other associated symptoms Since the discomfort has persisted will evaluate further Abdominal US Labs - amylase, lipase, cbc, cmp Consider ct scan depending on above

## 2016-07-06 NOTE — Progress Notes (Signed)
Pre visit review using our clinic review tool, if applicable. No additional management support is needed unless otherwise documented below in the visit note. 

## 2016-07-06 NOTE — Patient Instructions (Signed)
Blood work and an abdominal ultrasound was ordered.   Test(s) ordered today. Your results will be released to [place] (or called to you) after review, usually within 72hours after test completion. If any changes need to be made, you will be notified at that same time.   Medications reviewed and updated.  No changes recommended at this time.

## 2016-07-06 NOTE — Progress Notes (Signed)
Subjective:    Patient ID: Ryan Cobb, male    DOB: 07-02-1951, 65 y.o.   MRN: [id number]  HPI  He is here for an acute visit.   In April he started having pain in his left side.  It started when he was flyging - initially it was a sharp pain and then eased up after a couple of minutes. He has not been sharp since then - it has been a dull pain or soreness.  He only has pain when sitting.  It he slumps in the chair it improves.  No pain with standing or walking.  There is no tenderness to touch.  He is able to exercise without increased pain.  Nothing seems to make it better or worse except position.  He denies associated symptoms.    He did fly to [place] before this happened.  It was after his neck surgery and he is not sure if he was ready for this big of a trip.  He was then flying to [place] when it occurred.    Medications and allergies reviewed with patient and updated if appropriate.  Patient Active Problem List   Diagnosis Date Noted  . Pulmonary edema   . Difficult intubation   . Elective surgery   . Respiratory failure ([place])   . Radiculopathy 01/06/2016  . Cervical disc disorder with radiculopathy of cervical region 12/18/2015  . Nonspecific abnormal electrocardiogram (ECG) (EKG) 01/01/2013  . Hyperlipidemia 03/04/2010  . Essential hypertension 03/04/2010  . ALLERGIC RHINITIS 03/04/2010  . History of colonic polyps 03/04/2010    Current Outpatient Prescriptions on File Prior to Visit  Medication Sig Dispense Refill  . b complex vitamins tablet Take 1 tablet by mouth daily.    Marland Kitchen Bioflavonoid Products (ESTER C PO) Take 500 mg by mouth daily.    . calcium carbonate (OS-CAL) 600 MG TABS tablet Take 600 mg by mouth daily.    . carvedilol (COREG) 3.125 MG tablet TAKE 1 TABLET TWICE DAILY. 60 tablet 6  . cholecalciferol (VITAMIN D) 1000 units tablet Take 5,000 Units by mouth 4 (four) times a week.    . Cholecalciferol (VITAMIN D3) 3000 UNITS TABS Take by mouth. Reported  on 12/23/2015    . Coenzyme Q10 (CO Q 10) 10 MG CAPS Take 10 mg by mouth daily.     . fluticasone (FLONASE) 50 MCG/ACT nasal spray Place 1 spray into the nose 2 (two) times daily. (Patient taking differently: Place 1 spray into the nose daily as needed for allergies or rhinitis. ) 16 g 11  . Garlic 123XX123 MG CAPS Take 1,000 mg by mouth daily.     Marland Kitchen LECITHIN PO Take 1,200 mg by mouth daily.    Marland Kitchen lisinopril-hydrochlorothiazide (PRINZIDE,ZESTORETIC) 20-25 MG tablet TAKE 1 TABLET EACH DAY. 90 tablet 3  . Magnesium 250 MG TABS Take 250 mg by mouth daily.     . montelukast (SINGULAIR) 10 MG tablet TAKE ONE TABLET AT BEDTIME. 30 tablet 6  . Multiple Vitamins-Minerals (CENTRUM SILVER PO) Take 1 tablet by mouth daily.     . Multiple Vitamins-Minerals (OCUVITE EYE + MULTI PO) Take 1 tablet by mouth daily.     . Omega 3 1200 MG CAPS Take 1,200 mg by mouth daily.      No current facility-administered medications on file prior to visit.    Past Medical History  Diagnosis Date  . Hyperlipidemia   . Hypertension   . Seasonal rhinitis     Flonase  as needed; Singulair daily  . Colon polyps 2006    hyperplastic  . Basal cell adenoma     history of  . Arthritis   . GERD (gastroesophageal reflux disease)   . Cervical disc disorder   . Difficult intubation     Past Surgical History  Procedure Laterality Date  . Knee arthroscopy  2007    Dr Berenice Primas  . Knee surgery  1973    Left Knee Surgery   . Replacement total knee  2009    Dr Berenice Primas  . Lipoma excision      Right Waist  . Cervical laminectomy  2010    Dr. Carloyn Manner  . Inguinal hernia repair  2003    left  . Colonoscopy w/ polypectomy  2006    negative 2011  . Labrum tear & bone spur surgery  02/2013    Dr Berenice Primas  . Appendectomy  1995  . Abdominal cyst      removed in 1994  . Cervical fusion  01/06/2016    POST  LEVEL 5  . Anterior cervical decompression/discectomy fusion 4 levels N/A 01/06/2016    Procedure: ANTERIOR CERVICAL  DECOMPRESSION/DISCECTOMY FUSION 4 LEVELS;  Surgeon: Phylliss Bob, MD;  Location: [place];  Service: Orthopedics;  Laterality: N/A;  Anterior cerivcal decompression fusion, cervical 3-4, cervical 4-5, cervical 5-6, cervical 6-7 with instrumentation and allograft  . Posterior cervical fusion/foraminotomy N/A 01/07/2016    Procedure: POSTERIOR CERVICAL FUSION LEVEL 5;  Surgeon: Phylliss Bob, MD;  Location: [place];  Service: Orthopedics;  Laterality: N/A;  Posterior spinal fusion cervical 3-4, cervical 4-5, cervical 5-6, cervical 6-7, cervical 7-thoracic 1 with instrumentation and allograft    Social History   Social History  . Marital Status: Married    Spouse Name: N/A  . Number of Children: N/A  . Years of Education: N/A   Social History Main Topics  . Smoking status: Former Smoker    Types: Cigarettes    Quit date: 12/20/1979  . Smokeless tobacco: Never Used     Comment: smoked 1967-1981, up to 1 ppd  . Alcohol Use: 1.8 - 2.4 oz/week    3-4 Standard drinks or equivalent per week     Comment: occasional beer   . Drug Use: No  . Sexual Activity:    Partners: Female   Other Topics Concern  . Not on file   Social History Narrative    Family History  Problem Relation Age of Onset  . Alcohol abuse Father   . Diabetes Father   . Lung cancer Paternal Grandfather   . Hyperlipidemia Paternal Grandmother   . Alcohol abuse Mother   . Cirrhosis Mother   . Hyperlipidemia Maternal Grandfather   . Stroke Maternal Grandfather 43  . Breast cancer Sister   . Heart attack Neg Hx     Review of Systems  Constitutional: Negative for fever and chills.  Respiratory: Negative for cough, shortness of breath and wheezing.   Cardiovascular: Negative for chest pain, palpitations and leg swelling.  Gastrointestinal: Negative for nausea, abdominal pain, diarrhea, constipation and blood in stool.  Genitourinary: Negative for dysuria and hematuria.       Objective:   Filed Vitals:   07/06/16  1621  BP: 134/86  Pulse: 62  Temp: 98.2 F (36.8 C)  Resp: 16   Filed Weights   07/06/16 1621  Weight: 175 lb (79.379 kg)   Body mass index is 27.4 kg/(m^2).   Physical Exam  Constitutional: He appears well-developed and  well-nourished. No distress.  Cardiovascular: Normal rate and normal heart sounds.   No murmur heard. Pulmonary/Chest: Effort normal and breath sounds normal. No respiratory distress. He has no wheezes. He has no rales. He exhibits no tenderness.  Abdominal: Soft. He exhibits no distension and no mass. There is tenderness (mild tenderness under left rib cage anteriorly). There is no rebound and no guarding.  Musculoskeletal: He exhibits no edema.  Skin: Skin is warm and dry. He is not diaphoretic.       Assessment & Plan:   See Problem List for Assessment and Plan of chronic medical problems.

## 2016-07-08 ENCOUNTER — Other Ambulatory Visit (INDEPENDENT_AMBULATORY_CARE_PROVIDER_SITE_OTHER): Payer: No Typology Code available for payment source

## 2016-07-08 DIAGNOSIS — R1012 Left upper quadrant pain: Secondary | ICD-10-CM

## 2016-07-08 LAB — COMPREHENSIVE METABOLIC PANEL
ALT: 18 U/L (ref 0–53)
AST: 21 U/L (ref 0–37)
Albumin: 4.1 g/dL (ref 3.5–5.2)
Alkaline Phosphatase: 52 U/L (ref 39–117)
BILIRUBIN TOTAL: 0.5 mg/dL (ref 0.2–1.2)
BUN: 17 mg/dL (ref 6–23)
CALCIUM: 9.9 mg/dL (ref 8.4–10.5)
CHLORIDE: 103 meq/L (ref 96–112)
CO2: 29 meq/L (ref 19–32)
CREATININE: 1.05 mg/dL (ref 0.40–1.50)
GFR: 74.99 mL/min (ref >60.00)
GLUCOSE: 102 mg/dL — AB (ref 70–99)
Potassium: 3.9 mEq/L (ref 3.5–5.1)
SODIUM: 138 meq/L (ref 135–145)
Total Protein: 7 g/dL (ref 6.0–8.3)

## 2016-07-08 LAB — CBC WITH DIFFERENTIAL/PLATELET
BASOS PCT: 0.8 % (ref 0.0–3.0)
Basophils Absolute: 0.1 10*3/uL (ref 0.0–0.1)
EOS ABS: 0.2 10*3/uL (ref 0.0–0.7)
Eosinophils Relative: 3.1 % (ref 0.0–5.0)
HEMATOCRIT: 44.3 % (ref 39.0–52.0)
HEMOGLOBIN: 15 g/dL (ref 13.0–17.0)
Lymphocytes Relative: 29.5 % (ref 12.0–46.0)
Lymphs Abs: 2.1 10*3/uL (ref 0.7–4.0)
MCHC: 33.9 g/dL (ref 30.0–36.0)
MCV: 91.5 fl (ref 78.0–100.0)
MONO ABS: 0.7 10*3/uL (ref 0.1–1.0)
Monocytes Relative: 9.5 % (ref 3.0–12.0)
Neutro Abs: 4.1 10*3/uL (ref 1.4–7.7)
Neutrophils Relative %: 57.1 % (ref 43.0–77.0)
PLATELETS: 252 10*3/uL (ref 150.0–400.0)
RBC: 4.84 Mil/uL (ref 4.22–5.81)
RDW: 13.7 % (ref 11.5–15.5)
WBC: 7.1 10*3/uL (ref 4.0–10.5)

## 2016-07-09 ENCOUNTER — Encounter: Payer: Self-pay | Admitting: Internal Medicine

## 2016-07-18 ENCOUNTER — Ambulatory Visit
Admission: RE | Admit: 2016-07-18 | Discharge: 2016-07-18 | Disposition: A | Discharge status: Home / Self Care | Payer: No Typology Code available for payment source | Source: Ambulatory Visit | Attending: Internal Medicine | Admitting: Internal Medicine

## 2016-07-18 DIAGNOSIS — R1012 Left upper quadrant pain: Secondary | ICD-10-CM

## 2016-07-25 ENCOUNTER — Telehealth: Payer: Self-pay | Admitting: Internal Medicine

## 2016-07-25 DIAGNOSIS — R1012 Left upper quadrant pain: Secondary | ICD-10-CM

## 2016-07-25 NOTE — Telephone Encounter (Signed)
Pt states that he is still having pain and would like to go ahead and get a CT done.

## 2016-07-26 NOTE — Telephone Encounter (Signed)
Ct scan ordered.

## 2016-08-03 ENCOUNTER — Ambulatory Visit (INDEPENDENT_AMBULATORY_CARE_PROVIDER_SITE_OTHER)
Admission: RE | Admit: 2016-08-03 | Discharge: 2016-08-03 | Disposition: A | Discharge status: Home / Self Care | Payer: No Typology Code available for payment source | Source: Ambulatory Visit | Attending: Internal Medicine | Admitting: Internal Medicine

## 2016-08-03 DIAGNOSIS — R1012 Left upper quadrant pain: Secondary | ICD-10-CM

## 2016-08-03 MED ORDER — IOPAMIDOL (ISOVUE-300) INJECTION 61%
100.0000 mL | Freq: Once | INTRAVENOUS | Status: AC | PRN
  Administered 2016-08-03: 100 mL via INTRAVENOUS

## 2016-08-30 DIAGNOSIS — I10 Essential (primary) hypertension: Secondary | ICD-10-CM | POA: Diagnosis not present

## 2016-08-30 DIAGNOSIS — H25099 Other age-related incipient cataract, unspecified eye: Secondary | ICD-10-CM | POA: Diagnosis not present

## 2016-08-30 DIAGNOSIS — H5213 Myopia, bilateral: Secondary | ICD-10-CM | POA: Diagnosis not present

## 2016-09-30 ENCOUNTER — Ambulatory Visit (INDEPENDENT_AMBULATORY_CARE_PROVIDER_SITE_OTHER): Payer: No Typology Code available for payment source | Admitting: Internal Medicine

## 2016-09-30 ENCOUNTER — Encounter: Payer: Self-pay | Admitting: Internal Medicine

## 2016-09-30 VITALS — BP 142/82 | HR 63 | Temp 98.6°F | Resp 16 | Wt 171.0 lb

## 2016-09-30 DIAGNOSIS — J019 Acute sinusitis, unspecified: Secondary | ICD-10-CM

## 2016-09-30 DIAGNOSIS — I10 Essential (primary) hypertension: Secondary | ICD-10-CM | POA: Diagnosis not present

## 2016-09-30 DIAGNOSIS — R079 Chest pain, unspecified: Secondary | ICD-10-CM | POA: Diagnosis not present

## 2016-09-30 DIAGNOSIS — Z23 Encounter for immunization: Secondary | ICD-10-CM

## 2016-09-30 DIAGNOSIS — N529 Male erectile dysfunction, unspecified: Secondary | ICD-10-CM | POA: Insufficient documentation

## 2016-09-30 MED ORDER — HYDROCODONE-HOMATROPINE 5-1.5 MG/5ML PO SYRP: 5.0000 mL | ORAL_SOLUTION | Freq: Three times a day (TID) | ORAL | 0 refills | Status: DC | PRN

## 2016-09-30 MED ORDER — AZITHROMYCIN 250 MG PO TABS: ORAL_TABLET | ORAL | 0 refills | Status: DC

## 2016-09-30 MED ORDER — AMLODIPINE BESYLATE 5 MG PO TABS: 5.0000 mg | ORAL_TABLET | Freq: Every day | ORAL | 3 refills | Status: DC

## 2016-09-30 NOTE — Assessment & Plan Note (Signed)
Possibly bacterial given his history We'll give a prescription for cough syrup and a Z-Pak since this has been helpful in the past Continue Flonase and saline nasal washes

## 2016-09-30 NOTE — Progress Notes (Signed)
Subjective:    Patient ID: Ryan Cobb, male    DOB: Jan 03, 1951, 65 y.o.   MRN: [id number]  HPI He is here for an acute visit.     Pain in left upper quadrant /left lower chest:  He continues to have pain in this area. An abdominal ultrasound and CT scan of the abdomen did not reveal a cause. The pain is intermittent. He feels that the pain is becoming more frequent and moving more anteriorly.  He was initially only having pain when he was sitting occurs more when standing. He denies any association with eating or type of food. He denies any aggravation with activity. He has a cold currently denies any increased pain with coughing. The pain is typically a dull, aggravating pain, but can be sharp at times.   Cold symptoms:  He always gets a cold this time of the year.   He has a cough that is productive of clear phlegm when he coughs something up.  He has PND,  sore throat, nasal congestion and sinus pressure. He denies any shortness of breath or wheezing.  He is using flonase.  He typically gets this at least once a year and in the past and has always tried into a more serious infection requiring antibiotics. He feels that everything is doing is not helping.  Hypertension:  He has been on his BP medications for a while.  Since being on coreg he feels more anxiety, has more difficulty making decisions and has some ED.  He can get an erection, but it does not last.  He can achieve orgasm, but it takes work.  He wonders about switching to a different medication. He wonders if the lisinopril or hydrochlorothiazide also be causing this problem.    He did retire this year because he felt he was not able to do his job as well as he should.  He is not sure if that is related to the coreg or not.  His energy level is good.   Medications and allergies reviewed with patient and updated if appropriate.  Patient Active Problem List   Diagnosis Date Noted  . LUQ pain 07/06/2016  . Pulmonary edema     . Difficult intubation   . Respiratory failure ([place])   . Radiculopathy 01/06/2016  . Cervical disc disorder with radiculopathy of cervical region 12/18/2015  . Nonspecific abnormal electrocardiogram (ECG) (EKG) 01/01/2013  . Hyperlipidemia 03/04/2010  . Essential hypertension 03/04/2010  . ALLERGIC RHINITIS 03/04/2010  . History of colonic polyps 03/04/2010    Current Outpatient Prescriptions on File Prior to Visit  Medication Sig Dispense Refill  . b complex vitamins tablet Take 1 tablet by mouth daily.    Marland Kitchen Bioflavonoid Products (ESTER C PO) Take 500 mg by mouth daily.    . calcium carbonate (OS-CAL) 600 MG TABS tablet Take 600 mg by mouth daily.    . carvedilol (COREG) 3.125 MG tablet TAKE 1 TABLET TWICE DAILY. 60 tablet 6  . Cetirizine HCl (ZYRTEC ALLERGY PO) Take by mouth.    . cholecalciferol (VITAMIN D) 1000 units tablet Take 5,000 Units by mouth 4 (four) times a week.    . Cholecalciferol (VITAMIN D3) 3000 UNITS TABS Take by mouth. Reported on 12/23/2015    . Coenzyme Q10 (CO Q 10) 10 MG CAPS Take 10 mg by mouth daily.     . fluticasone (FLONASE) 50 MCG/ACT nasal spray Place 1 spray into the nose 2 (two) times daily. (Patient taking  differently: Place 1 spray into the nose daily as needed for allergies or rhinitis. ) 16 g 11  . Garlic 123XX123 MG CAPS Take 1,000 mg by mouth daily.     Marland Kitchen LECITHIN PO Take 1,200 mg by mouth daily.    Marland Kitchen lisinopril-hydrochlorothiazide (PRINZIDE,ZESTORETIC) 20-25 MG tablet TAKE 1 TABLET EACH DAY. 90 tablet 3  . Magnesium 250 MG TABS Take 250 mg by mouth daily.     . montelukast (SINGULAIR) 10 MG tablet TAKE ONE TABLET AT BEDTIME. 30 tablet 6  . Multiple Vitamins-Minerals (CENTRUM SILVER PO) Take 1 tablet by mouth daily.     . Multiple Vitamins-Minerals (OCUVITE EYE + MULTI PO) Take 1 tablet by mouth daily.     . Omega 3 1200 MG CAPS Take 1,200 mg by mouth daily.      No current facility-administered medications on file prior to visit.     Past Medical  History:  Diagnosis Date  . Arthritis   . Basal cell adenoma    history of  . Cervical disc disorder   . Colon polyps 2006   hyperplastic  . Difficult intubation   . GERD (gastroesophageal reflux disease)   . Hyperlipidemia   . Hypertension   . Seasonal rhinitis    Flonase as needed; Singulair daily    Past Surgical History:  Procedure Laterality Date  . abdominal cyst     removed in 1994  . ANTERIOR CERVICAL DECOMPRESSION/DISCECTOMY FUSION 4 LEVELS N/A 01/06/2016   Procedure: ANTERIOR CERVICAL DECOMPRESSION/DISCECTOMY FUSION 4 LEVELS;  Surgeon: Phylliss Bob, MD;  Location: [facility];  Service: Orthopedics;  Laterality: N/A;  Anterior cerivcal decompression fusion, cervical 3-4, cervical 4-5, cervical 5-6, cervical 6-7 with instrumentation and allograft  . APPENDECTOMY  1995  . CERVICAL FUSION  01/06/2016   POST  LEVEL 5  . CERVICAL LAMINECTOMY  2010   Dr. Carloyn Manner  . COLONOSCOPY W/ POLYPECTOMY  2006   negative 2011  . INGUINAL HERNIA REPAIR  2003   left  . KNEE ARTHROSCOPY  2007   Dr Berenice Primas  . [place]   Left Knee Surgery   . labrum tear & bone spur surgery  02/2013   Dr Berenice Primas  . LIPOMA EXCISION     Right Waist  . POSTERIOR CERVICAL FUSION/FORAMINOTOMY N/A 01/07/2016   Procedure: POSTERIOR CERVICAL FUSION LEVEL 5;  Surgeon: Phylliss Bob, MD;  Location: [facility];  Service: Orthopedics;  Laterality: N/A;  Posterior spinal fusion cervical 3-4, cervical 4-5, cervical 5-6, cervical 6-7, cervical 7-thoracic 1 with instrumentation and allograft  . REPLACEMENT TOTAL KNEE  2009   Dr Berenice Primas    Social History   Social History  . Marital status: Married    Spouse name: N/A  . Number of children: N/A  . Years of education: N/A   Social History Main Topics  . Smoking status: Former Smoker    Types: Cigarettes    Quit date: 12/20/1979  . Smokeless tobacco: Never Used     Comment: smoked 1967-1981, up to 1 ppd  . Alcohol use 1.8 - 2.4 oz/week    3 - 4 Standard drinks or  equivalent per week     Comment: occasional beer   . Drug use: No  . Sexual activity: Yes    Partners: Female   Other Topics Concern  . Not on file   Social History Narrative  . No narrative on file    Family History  Problem Relation Age of Onset  . Alcohol abuse  Father   . Diabetes Father   . Lung cancer Paternal Grandfather   . Hyperlipidemia Paternal Grandmother   . Alcohol abuse Mother   . Cirrhosis Mother   . Hyperlipidemia Maternal Grandfather   . Stroke Maternal Grandfather 40  . Breast cancer Sister   . Heart attack Neg Hx     Review of Systems  Constitutional: Negative for chills and fever.  HENT: Positive for congestion, postnasal drip, sinus pressure and sore throat (from PND). Negative for ear pain.   Respiratory: Positive for cough. Negative for shortness of breath and wheezing.   Cardiovascular: Negative for chest pain, palpitations and leg swelling.  Gastrointestinal:       Pain left upper abdomen more left lower rib cage-pain feels that it is under the rib cage. Intermittent.  Neurological: Positive for headaches. Negative for light-headedness.       Objective:   Vitals:   09/30/16 1603  BP: (!) 142/82  Pulse: 63  Resp: 16  Temp: 98.6 F (37 C)   Filed Weights   09/30/16 1603  Weight: 171 lb (77.6 kg)   Body mass index is 26.78 kg/m.   Physical Exam GENERAL APPEARANCE: Appears stated age, well appearing, NAD EYES: conjunctiva clear, no icterus HEENT: bilateral tympanic membranes and ear canals normal, oropharynx with moderate erythema, no thyromegaly, trachea midline, no cervical or supraclavicular lymphadenopathy LUNGS: Clear to auscultation without wheeze or crackles, unlabored breathing, good air entry bilaterally CHEST: non-tender Left lower rib cage, no deformity noted HEART: Normal S1,S2 without murmurs ABDOMEN: soft, non-tender, non-distended EXTREMITIES: Without clubbing, cyanosis, or edema SKIN: No color change, no  rash        Assessment & Plan:   See Problem List for Assessment and Plan of chronic medical problems.

## 2016-09-30 NOTE — Patient Instructions (Addendum)
Your goal BP is less than 150/90.  Test(s) ordered today. Your results will be released to [facility] (or called to you) after review, usually within 72hours after test completion. If any changes need to be made, you will be notified at that same time.  All other Health Maintenance issues reviewed.   All recommended immunizations and age-appropriate screenings are up-to-date or discussed.  Flu vaccine administered today.   Medications reviewed and updated.  Changes include discontinuing the carvedilol and start amlodipine 5 mg daily.  An antibiotic was sent to your pharaccy for your cold symptoms. A cough syrup was given  Your prescription(s) have been submitted to your pharmacy. Please take as directed and contact our office if you believe you are having problem(s) with the medication(s).  We will schedule you with Dr Tamala Julian for evaluation of your pain.

## 2016-09-30 NOTE — Assessment & Plan Note (Signed)
Right-sided pain-lower chest or upper abdomen-intermittent and somewhat chronic at this point Pain is typically aggravating, dull pain, occasionally sharp in nature Abdominal ultrasound and CT scan of the abdomen showed no cause for the pain No obvious pattern for the pain ? Costochondritis versus thoracic radiculopathy Will refer to Dr. Tamala Julian for further evaluation and his opinion

## 2016-09-30 NOTE — Assessment & Plan Note (Signed)
?   Possible side effect from carvedilol We'll discontinue carvedilol and replaced it with something else Can consider discontinuing the medications if there is no improvement Discussed trial of Viagra or Cialis-he deferred for now Discussed checking testosterone level-he deferred for now

## 2016-09-30 NOTE — Progress Notes (Signed)
Pre visit review using our clinic review tool, if applicable. No additional management support is needed unless otherwise documented below in the visit note. 

## 2016-09-30 NOTE — Assessment & Plan Note (Signed)
Blood pressure reasonably controlled today, but will make changes because of concerns with side effects from medication He will start monitoring at home Start Amlodipine 5 mg daily Discontinue carvedilol and continue lisinopril hydrochlorothiazide 20-25 daily

## 2016-10-11 NOTE — Progress Notes (Signed)
Ryan Cobb, Ryan Cobb Phone: [phone] Subjective:    I'm seeing this patient by the request  of:  Binnie Rail, MD   CC: Rib pain  [id number]  Ryan Cobb is a 65 y.o. male coming in with complaint of rib pain. Patient states this has been mostly on the left side. Patient has had an abdominal ultrasound as a CT scan of the abdomen which was in apparently visualized by me and showed no significant bony abnormality or musculoskeletal pathology that could be contributing to his pain. Patient states that it seems to be incurring more frequent. States that the pain that seems to be worse with sitting than with standing. No association with eating or fluid. Does not know which factor seemed to aggravate it. Describes this as a dull pain. Sometimes can be sharp. he does state that it had been the most days of the week and when it occurs it can last minutes to hours. Seems to only dissipate based on time.     Past Medical History:  Diagnosis Date  . Arthritis   . Basal cell adenoma    history of  . Cervical disc disorder   . Colon polyps 2006   hyperplastic  . Difficult intubation   . GERD (gastroesophageal reflux disease)   . Hyperlipidemia   . Hypertension   . Seasonal rhinitis    Flonase as needed; Singulair daily   Past Surgical History:  Procedure Laterality Date  . abdominal cyst     removed in 1994  . ANTERIOR CERVICAL DECOMPRESSION/DISCECTOMY FUSION 4 LEVELS N/A 01/06/2016   Procedure: ANTERIOR CERVICAL DECOMPRESSION/DISCECTOMY FUSION 4 LEVELS;  Surgeon: Phylliss Bob, MD;  Location: [facility];  Service: Orthopedics;  Laterality: N/A;  Anterior cerivcal decompression fusion, cervical 3-4, cervical 4-5, cervical 5-6, cervical 6-7 with instrumentation and allograft  . APPENDECTOMY  1995  . CERVICAL FUSION  01/06/2016   POST  LEVEL 5  . CERVICAL LAMINECTOMY  2010   Dr. Carloyn Manner  . COLONOSCOPY W/ POLYPECTOMY  2006   negative 2011  . INGUINAL HERNIA REPAIR  2003   left  . KNEE ARTHROSCOPY  2007   Dr Berenice Primas  . [place]   Left Knee Surgery   . labrum tear & bone spur surgery  02/2013   Dr Berenice Primas  . LIPOMA EXCISION     Right Waist  . POSTERIOR CERVICAL FUSION/FORAMINOTOMY N/A 01/07/2016   Procedure: POSTERIOR CERVICAL FUSION LEVEL 5;  Surgeon: Phylliss Bob, MD;  Location: [facility];  Service: Orthopedics;  Laterality: N/A;  Posterior spinal fusion cervical 3-4, cervical 4-5, cervical 5-6, cervical 6-7, cervical 7-thoracic 1 with instrumentation and allograft  . REPLACEMENT TOTAL KNEE  2009   Dr Berenice Primas   Social History   Social History  . Marital status: Married    Spouse name: N/A  . Number of children: N/A  . Years of education: N/A   Social History Main Topics  . Smoking status: Former Smoker    Types: Cigarettes    Quit date: 12/20/1979  . Smokeless tobacco: Never Used     Comment: smoked 1967-1981, up to 1 ppd  . Alcohol use 1.8 - 2.4 oz/week    3 - 4 Standard drinks or equivalent per week     Comment: occasional beer   . Drug use: No  . Sexual activity: Yes    Partners: Female   Other Topics Concern  . None  Social History Narrative  . None   No Known Allergies Family History  Problem Relation Age of Onset  . Alcohol abuse Father   . Diabetes Father   . Lung cancer Paternal Grandfather   . Hyperlipidemia Paternal Grandmother   . Alcohol abuse Mother   . Cirrhosis Mother   . Hyperlipidemia Maternal Grandfather   . Stroke Maternal Grandfather 41  . Breast cancer Sister   . Heart attack Neg Hx     Past medical history, social, surgical and family history all reviewed in electronic medical record.  No pertanent information unless stated regarding to the chief complaint.   Review of Systems: No headache, visual changes, , diarrhea, constipation, dizziness, abdominal pain, skin rash, fevers, chills, night sweats, weight loss, swollen lymph nodes, body aches, joint  swelling,  chest pain, shortness of breath, mood changes.   Objective  Blood pressure 122/72, pulse 60, weight 171 lb (77.6 kg), SpO2 98 %.  General: No apparent distress alert and oriented x3 mood and affect normal, dressed appropriately.  HEENT: Pupils equal, extraocular movements intact  Respiratory: Patient's speak in full sentences and does not appear short of breath  Cardiovascular: No lower extremity edema, non tender, no erythema  Skin: Warm dry intact with no signs of infection or rash on extremities or on axial skeleton.  Abdomen: Soft nontender patient does have floating ribs that are.palpated but seemed to be in normal position, possibly little longer than average. Extending anterior. No rash appreciated in the area. No pain with rebounded and no guarding. Neuro: Cranial nerves II through XII are intact, neurovascularly intact in all extremities with 2+ DTRs and 2+ pulses.  Lymph: No lymphadenopathy of posterior or anterior cervical chain or axillae bilaterally.  Gait normal with good balance and coordination.  MSK:  Non tender with full range of motion and good stability and symmetric strength and tone of shoulders, elbows, wrist, hip, and ankles bilaterally.     Impression and Recommendations:     This case required medical decision making of moderate complexity.      Note: This dictation was prepared with Dragon dictation along with smaller phrase technology. Any transcriptional errors that result from this process are unintentional.

## 2016-10-12 ENCOUNTER — Ambulatory Visit (INDEPENDENT_AMBULATORY_CARE_PROVIDER_SITE_OTHER): Payer: No Typology Code available for payment source | Admitting: Family Medicine

## 2016-10-12 ENCOUNTER — Encounter: Payer: Self-pay | Admitting: Family Medicine

## 2016-10-12 DIAGNOSIS — R1012 Left upper quadrant pain: Secondary | ICD-10-CM

## 2016-10-12 NOTE — Assessment & Plan Note (Signed)
Interesting finding. Patient does not have any true abnormality on physical exam today. Patient states that it seems to be intermittent and seems to be less likely with activity. Patient is going to keep a diary and see if there is any association of anything and see if there is any type of abnormality in his poles or other vitals. I'm concern for more of a referred pain from a gastrointestinal problems such as GERD. Patient will try some over-the-counter medications. Given topical anti-inflammatories a see if any inflammatory response could be contributing. We did go her patient's medications including over-the-counter vitamins in great detail today. Patient will try these conservative therapies and come back again in 3-4 weeks. At that time if no significant improvement we'll consider an abdominal ultrasound evaluating for any type of cutaneous nerve entrapment and possible referral to gastroenterology.

## 2016-10-12 NOTE — Patient Instructions (Signed)
Good to see you  Take 500mg  of vitamin C with your iron containing meals.  Prilosec 2 times daily for 2 weeks.  pennsaid pinkie amount topically 2 times daily as needed.  Maybe keep a diary and see if there is any rhyme or reason of timing, activity or food that could cause it.  Decrease calcium to 3 times a week.  See me again in 3-4 weeks and if not better we will see if we can treat the nerve as a possible problem.

## 2016-10-28 ENCOUNTER — Other Ambulatory Visit: Payer: Self-pay | Admitting: Internal Medicine

## 2016-11-01 NOTE — Progress Notes (Signed)
[facility] [address], [place] [address] Phone: [phone] Subjective:    I'm seeing this patient by the request  of:  Binnie Rail, MD   CC: Rib pain f/u  [id number]  Ryan Cobb is a 65 y.o. male coming in with complaint of rib pain. Patient states this has been mostly on the left side. Patient has had an abdominal ultrasound as a CT scan of the abdomen which was in visualized by me and showed no significant bony abnormality or musculoskeletal pathology that could be contributing to his pain. .  Patient's was to attempt to be treated for reflux disease with Prilosec, vitamin C supplementation to help with the exception of iron in case this is secondary to muscle spasms, given trial topical anti-inflammatories and patient states That Prilosec was helpful for some time. Still does not make the pain go away completely. Patient was attempting to keep a diary of any type of triggers but it seemed to make no sense. States that this could occur 0 times a day to 10 times a day. Intramedullary if he was doing activity or not or any association with food. Patient states though is on the left side and seems to go from the anterior aspect and radiate posteriorly now.    Past Medical History:  Diagnosis Date  . Arthritis   . Basal cell adenoma    history of  . Cervical disc disorder   . Colon polyps 2006   hyperplastic  . Difficult intubation   . GERD (gastroesophageal reflux disease)   . Hyperlipidemia   . Hypertension   . Seasonal rhinitis    Flonase as needed; Singulair daily   Past Surgical History:  Procedure Laterality Date  . abdominal cyst     removed in 1994  . ANTERIOR CERVICAL DECOMPRESSION/DISCECTOMY FUSION 4 LEVELS N/A 01/06/2016   Procedure: ANTERIOR CERVICAL DECOMPRESSION/DISCECTOMY FUSION 4 LEVELS;  Surgeon: Phylliss Bob, MD;  Location: [facility];  Service: Orthopedics;  Laterality: N/A;  Anterior cerivcal decompression fusion,  cervical 3-4, cervical 4-5, cervical 5-6, cervical 6-7 with instrumentation and allograft  . APPENDECTOMY  1995  . CERVICAL FUSION  01/06/2016   POST  LEVEL 5  . CERVICAL LAMINECTOMY  2010   Dr. Carloyn Manner  . COLONOSCOPY W/ POLYPECTOMY  2006   negative 2011  . INGUINAL HERNIA REPAIR  2003   left  . KNEE ARTHROSCOPY  2007   Dr Berenice Primas  . [place]   Left Knee Surgery   . labrum tear & bone spur surgery  02/2013   Dr Berenice Primas  . LIPOMA EXCISION     Right Waist  . POSTERIOR CERVICAL FUSION/FORAMINOTOMY N/A 01/07/2016   Procedure: POSTERIOR CERVICAL FUSION LEVEL 5;  Surgeon: Phylliss Bob, MD;  Location: [facility];  Service: Orthopedics;  Laterality: N/A;  Posterior spinal fusion cervical 3-4, cervical 4-5, cervical 5-6, cervical 6-7, cervical 7-thoracic 1 with instrumentation and allograft  . REPLACEMENT TOTAL KNEE  2009   Dr Berenice Primas   Social History   Social History  . Marital status: Married    Spouse name: N/A  . Number of children: N/A  . Years of education: N/A   Social History Main Topics  . Smoking status: Former Smoker    Types: Cigarettes    Quit date: 12/20/1979  . Smokeless tobacco: Never Used     Comment: smoked 1967-1981, up to 1 ppd  . Alcohol use 1.8 - 2.4 oz/week  3 - 4 Standard drinks or equivalent per week     Comment: occasional beer   . Drug use: No  . Sexual activity: Yes    Partners: Female   Other Topics Concern  . None   Social History Narrative  . None   No Known Allergies Family History  Problem Relation Age of Onset  . Alcohol abuse Father   . Diabetes Father   . Lung cancer Paternal Grandfather   . Hyperlipidemia Paternal Grandmother   . Alcohol abuse Mother   . Cirrhosis Mother   . Hyperlipidemia Maternal Grandfather   . Stroke Maternal Grandfather 38  . Breast cancer Sister   . Heart attack Neg Hx     Past medical history, social, surgical and family history all reviewed in electronic medical record.  No pertanent information unless  stated regarding to the chief complaint.   Review of Systems: No headache, visual changes, , diarrhea, constipation, dizziness, abdominal pain, skin rash, fevers, chills, night sweats, weight loss, swollen lymph nodes, body aches, joint swelling,  chest pain, shortness of breath, mood changes.   Objective  Blood pressure 122/84, pulse (!) 49, height 5\' 8"  (1.727 m), weight 172 lb (78 kg), SpO2 98 %.  Systems examined below as of 11/02/16 General: NAD A&O x3 mood, affect normal  HEENT: Pupils equal, extraocular movements intact no nystagmus Respiratory: not short of breath at rest or with speaking Cardiovascular: No lower extremity edema, non tender Skin: Warm dry intact with no signs of infection or rash on extremities or on axial skeleton. Abdomen: Soft Minorly tender over the ninth rib on the left side. Otherwise fairly unremarkable mild pain in the epigastric region Neuro: Cranial nerves  intact, neurovascularly intact in all extremities with 2+ DTRs and 2+ pulses. Lymph: No lymphadenopathy appreciated today  Gait normal with good balance and coordination.  MSK: Non tender with full range of motion and good stability and symmetric strength and tone of shoulders, elbows, wrist,  knee hips and ankles bilaterally.   Osteopathic findings Cervical not done patient has a history of a fusion. T9 extended rotated and side bent left L2 flexed rotated and side bent right Sacrum left on left    Impression and Recommendations:     This case required medical decision making of moderate complexity.      Note: This dictation was prepared with Dragon dictation along with smaller phrase technology. Any transcriptional errors that result from this process are unintentional.

## 2016-11-02 ENCOUNTER — Encounter: Payer: Self-pay | Admitting: Family Medicine

## 2016-11-02 ENCOUNTER — Ambulatory Visit (INDEPENDENT_AMBULATORY_CARE_PROVIDER_SITE_OTHER): Payer: Medicare Other | Admitting: Family Medicine

## 2016-11-02 DIAGNOSIS — M999 Biomechanical lesion, unspecified: Secondary | ICD-10-CM | POA: Insufficient documentation

## 2016-11-02 DIAGNOSIS — M940 Chondrocostal junction syndrome [Tietze]: Secondary | ICD-10-CM

## 2016-11-02 NOTE — Patient Instructions (Signed)
Good to see you  Ice is your friend maybe.  Stay active and try the new exercises.  Do avoid any activity where your hands are outside your peripheral vison.  We tried manipulation and I hope it helps See me again in 3-4 weeks.

## 2016-11-02 NOTE — Assessment & Plan Note (Signed)
Decision today to treat with OMT was based on Physical Exam  After verbal consent patient was treated with HVLA, ME techniques in thoraicic, rib and lumbar areas  Patient tolerated the procedure well with improvement in symptoms  Patient given exercises, stretches and lifestyle modifications  See medications in patient instructions if given  Patient will follow up in 3-4 weeks

## 2016-11-02 NOTE — Assessment & Plan Note (Signed)
Discussed with patient at great length. Patient will try conservative therapy. Given home exercises and work on [employer] building services. We discussed proper lifting techniques and proper ergonomics throughout the day. Patient will continue to stay active otherwise. Follow-up again in 4 weeks for further evaluation. We discussed other treatment options including an cutaneous abdominal nerve injection which patient has declined again today.

## 2016-11-29 NOTE — Progress Notes (Signed)
[facility] [place] [place], [place] [address] Phone: [phone] Subjective:    I'm seeing this patient by the request  of:  Binnie Rail, MD   CC: Rib pain f/u  [id number]  Ryan Cobb is a 65 y.o. male coming in with complaint of rib pain. Patient states this has been mostly on the left side. Patient has had an abdominal ultrasound as a CT scan of the abdomen which was in visualized by me and showed no significant bony abnormality or musculoskeletal pathology that could be contributing to his pain. .  We attempted to treat patient for more of a slipped rib syndrome. We attempted osteopathic manipulation. Patient was given home exercises focusing on ergonomics posture and core strengthening. Patient states Making improvement at this time. Seems to be doing well. Patient is not having as severe pain. Patient states that the frequency of these pains decreasing as well. Feels that the manipulation was helpful. Most improvement he has had multiple months.  Past Medical History:  Diagnosis Date  . Arthritis   . Basal cell adenoma    history of  . Cervical disc disorder   . Colon polyps 2006   hyperplastic  . Difficult intubation   . GERD (gastroesophageal reflux disease)   . Hyperlipidemia   . Hypertension   . Seasonal rhinitis    Flonase as needed; Singulair daily   Past Surgical History:  Procedure Laterality Date  . abdominal cyst     removed in 1994  . ANTERIOR CERVICAL DECOMPRESSION/DISCECTOMY FUSION 4 LEVELS N/A 01/06/2016   Procedure: ANTERIOR CERVICAL DECOMPRESSION/DISCECTOMY FUSION 4 LEVELS;  Surgeon: Phylliss Bob, MD;  Location: [facility];  Service: Orthopedics;  Laterality: N/A;  Anterior cerivcal decompression fusion, cervical 3-4, cervical 4-5, cervical 5-6, cervical 6-7 with instrumentation and allograft  . APPENDECTOMY  1995  . CERVICAL FUSION  01/06/2016   POST  LEVEL 5  . CERVICAL LAMINECTOMY  2010   Dr. Carloyn Manner  .  COLONOSCOPY W/ POLYPECTOMY  2006   negative 2011  . INGUINAL HERNIA REPAIR  2003   left  . KNEE ARTHROSCOPY  2007   Dr Berenice Primas  . [place]   Left Knee Surgery   . labrum tear & bone spur surgery  02/2013   Dr Berenice Primas  . LIPOMA EXCISION     Right Waist  . POSTERIOR CERVICAL FUSION/FORAMINOTOMY N/A 01/07/2016   Procedure: POSTERIOR CERVICAL FUSION LEVEL 5;  Surgeon: Phylliss Bob, MD;  Location: [facility];  Service: Orthopedics;  Laterality: N/A;  Posterior spinal fusion cervical 3-4, cervical 4-5, cervical 5-6, cervical 6-7, cervical 7-thoracic 1 with instrumentation and allograft  . REPLACEMENT TOTAL KNEE  2009   Dr Berenice Primas   Social History   Social History  . Marital status: Married    Spouse name: N/A  . Number of children: N/A  . Years of education: N/A   Social History Main Topics  . Smoking status: Former Smoker    Types: Cigarettes    Quit date: 12/20/1979  . Smokeless tobacco: Never Used     Comment: smoked 1967-1981, up to 1 ppd  . Alcohol use 1.8 - 2.4 oz/week    3 - 4 Standard drinks or equivalent per week     Comment: occasional beer   . Drug use: No  . Sexual activity: Yes    Partners: Female   Other Topics Concern  . None   Social History Narrative  . None  No Known Allergies Family History  Problem Relation Age of Onset  . Alcohol abuse Father   . Diabetes Father   . Lung cancer Paternal Grandfather   . Hyperlipidemia Paternal Grandmother   . Alcohol abuse Mother   . Cirrhosis Mother   . Hyperlipidemia Maternal Grandfather   . Stroke Maternal Grandfather 78  . Breast cancer Sister   . Heart attack Neg Hx     Past medical history, social, surgical and family history all reviewed in electronic medical record.  No pertanent information unless stated regarding to the chief complaint.   Review of Systems: No headache, visual changes, nausea, vomiting, diarrhea, constipation, dizziness, abdominal pain, skin rash, fevers, chills, night sweats,  weight loss, swollen lymph nodes,, chest pain, shortness of breath, mood changes.     Objective  Blood pressure 124/82, pulse (!) 59, height 5\' 7"  (1.702 m), weight 171 lb (77.6 kg), SpO2 99 %.  Systems examined below as of 11/30/16 General: NAD A&O x3 mood, affect normal  HEENT: Pupils equal, extraocular movements intact no nystagmus Respiratory: not short of breath at rest or with speaking Cardiovascular: No lower extremity edema, non tender Skin: Warm dry intact with no signs of infection or rash on extremities or on axial skeleton. Abdomen: Soft nontender, no masses Neuro: Cranial nerves  intact, neurovascularly intact in all extremities with 2+ DTRs and 2+ pulses. Lymph: No lymphadenopathy appreciated today  Gait normal with good balance and coordination.  MSK: Non tender with full range of motion and good stability and symmetric strength and tone of shoulders, elbows, wrist,  knee hips and ankles bilaterally.   Mild arthritic changes.  Neck has decrease ROM due to surgery.  Back exam mild improvement in scapular strength.    Osteopathic findings Cervical not done patient has a history of a fusion. T7 extended rotated and side bent left inhaled 7th rib T9 extended rotated and side bent left L2 flexed rotated and side bent right Sacrum left on left    Impression and Recommendations:     This case required medical decision making of moderate complexity.      Note: This dictation was prepared with Dragon dictation along with smaller phrase technology. Any transcriptional errors that result from this process are unintentional.

## 2016-11-30 ENCOUNTER — Ambulatory Visit (INDEPENDENT_AMBULATORY_CARE_PROVIDER_SITE_OTHER): Payer: Medicare Other | Admitting: Family Medicine

## 2016-11-30 ENCOUNTER — Encounter: Payer: Self-pay | Admitting: Family Medicine

## 2016-11-30 VITALS — BP 124/82 | HR 59 | Ht 67.0 in | Wt 171.0 lb

## 2016-11-30 DIAGNOSIS — M999 Biomechanical lesion, unspecified: Secondary | ICD-10-CM

## 2016-11-30 DIAGNOSIS — M940 Chondrocostal junction syndrome [Tietze]: Secondary | ICD-10-CM | POA: Diagnosis not present

## 2016-11-30 NOTE — Patient Instructions (Signed)
Good to see you  I am impressed  Keep it up  We can space you out to 8 week intervals See me again in 8 weeks! Happy holidays!

## 2016-11-30 NOTE — Assessment & Plan Note (Signed)
Decision today to treat with OMT was based on Physical Exam  After verbal consent patient was treated with HVLA, ME techniques in thoraicic, rib and lumbar areas  Patient tolerated the procedure well with improvement in symptoms  Patient given exercises, stretches and lifestyle modifications  See medications in patient instructions if given  Patient will follow up in 8 weeks

## 2016-11-30 NOTE — Assessment & Plan Note (Signed)
Improving, conservative therapy  Continue current regimen Encourage posture and ergonomics See me again in 8 weeks.

## 2017-01-25 ENCOUNTER — Ambulatory Visit: Payer: Medicare Other | Admitting: Family Medicine

## 2017-01-30 ENCOUNTER — Encounter: Payer: Self-pay | Admitting: Nurse Practitioner

## 2017-01-30 ENCOUNTER — Ambulatory Visit (INDEPENDENT_AMBULATORY_CARE_PROVIDER_SITE_OTHER): Payer: Medicare Other | Admitting: Nurse Practitioner

## 2017-01-30 VITALS — BP 130/82 | HR 55 | Temp 98.0°F | Ht 67.0 in | Wt 175.0 lb

## 2017-01-30 DIAGNOSIS — J069 Acute upper respiratory infection, unspecified: Secondary | ICD-10-CM | POA: Diagnosis not present

## 2017-01-30 MED ORDER — BENZONATATE 100 MG PO CAPS: 100.0000 mg | ORAL_CAPSULE | Freq: Three times a day (TID) | ORAL | 0 refills | Status: DC | PRN

## 2017-01-30 MED ORDER — IPRATROPIUM BROMIDE 0.03 % NA SOLN: 2.0000 | Freq: Two times a day (BID) | NASAL | 0 refills | Status: DC

## 2017-01-30 MED ORDER — DM-GUAIFENESIN ER 30-600 MG PO TB12: 1.0000 | ORAL_TABLET | Freq: Two times a day (BID) | ORAL | 0 refills | Status: DC | PRN

## 2017-01-30 NOTE — Patient Instructions (Addendum)
URI Instructions:  Hold flonase while using atrovent nasal spray.  Encourage adequate oral hydration.  Use over-the-counter  "cold" medicines  such as "Tylenol cold" , "Advil cold",  "Mucinex" or" Mucinex D"  for cough and congestion.  Avoid decongestants if you have high blood pressure. Use" Delsym" or" Robitussin" cough syrup varietis for cough.  You can use plain "Tylenol" or "Advi"l for fever, chills and achyness.   "Common cold" symptoms are usually triggered by a virus.  The antibiotics are usually not necessary. On average, a" viral cold" illness would take 4-7 days to resolve. Please, make an appointment if you are not better or if you're worse.  Call for oral abx if no improvement in 1week.

## 2017-01-30 NOTE — Progress Notes (Signed)
Subjective:  Patient ID: Ryan Cobb, male    DOB: 1951/03/14  Age: 66 y.o. MRN: [id number]  CC: Sore Throat (sore throat,,burning near eyes area since 2 days. took flonase)   Sore Throat   This is a new problem. The current episode started yesterday. The problem has been unchanged. There has been no fever. Associated symptoms include congestion, coughing and headaches. Pertinent negatives include no abdominal pain, diarrhea, ear pain, plugged ear sensation, neck pain, shortness of breath, stridor, swollen glands or vomiting. He has had no exposure to strep or mono.  URI   This is a new problem. The current episode started yesterday. The problem has been unchanged. There has been no fever. Associated symptoms include congestion, coughing, headaches, rhinorrhea, sinus pain, sneezing and a sore throat. Pertinent negatives include no abdominal pain, chest pain, diarrhea, dysuria, ear pain, joint pain, joint swelling, nausea, neck pain, plugged ear sensation, swollen glands, vomiting or wheezing. Treatments tried: flonase. The treatment provided no relief.    Outpatient Medications Prior to Visit  Medication Sig Dispense Refill  . b complex vitamins tablet Take 1 tablet by mouth daily.    Marland Kitchen Bioflavonoid Products (ESTER C PO) Take 500 mg by mouth daily.    . calcium carbonate (OS-CAL) 600 MG TABS tablet Take 600 mg by mouth daily.    . Cetirizine HCl (ZYRTEC ALLERGY PO) Take by mouth.    . cholecalciferol (VITAMIN D) 1000 units tablet Take 5,000 Units by mouth 4 (four) times a week.    . Cholecalciferol (VITAMIN D3) 3000 UNITS TABS Take by mouth. Reported on 12/23/2015    . Coenzyme Q10 (CO Q 10) 10 MG CAPS Take 10 mg by mouth daily.     . fluticasone (FLONASE) 50 MCG/ACT nasal spray Place 1 spray into the nose 2 (two) times daily. (Patient taking differently: Place 1 spray into the nose daily as needed for allergies or rhinitis. ) 16 g 11  . Garlic 123XX123 MG CAPS Take 1,000 mg by mouth daily.      Marland Kitchen LECITHIN PO Take 1,200 mg by mouth daily.    Marland Kitchen lisinopril-hydrochlorothiazide (PRINZIDE,ZESTORETIC) 20-25 MG tablet TAKE 1 TABLET EACH DAY. 90 tablet 3  . Magnesium 250 MG TABS Take 250 mg by mouth daily.     . montelukast (SINGULAIR) 10 MG tablet TAKE ONE TABLET AT BEDTIME. 90 tablet 3  . Multiple Vitamins-Minerals (CENTRUM SILVER PO) Take 1 tablet by mouth daily.     . Multiple Vitamins-Minerals (OCUVITE EYE + MULTI PO) Take 1 tablet by mouth daily.     . Omega 3 1200 MG CAPS Take 1,200 mg by mouth daily.      No facility-administered medications prior to visit.     ROS See HPI  Objective:  BP 130/82   Pulse (!) 55   Temp 98 F (36.7 C)   Ht 5\' 7"  (1.702 m)   Wt 175 lb (79.4 kg)   SpO2 99%   BMI 27.41 kg/m   BP Readings from Last 3 Encounters:  01/30/17 130/82  11/30/16 124/82  11/02/16 122/84    Wt Readings from Last 3 Encounters:  01/30/17 175 lb (79.4 kg)  11/30/16 171 lb (77.6 kg)  11/02/16 172 lb (78 kg)    Physical Exam  Constitutional: He is oriented to person, place, and time. No distress.  HENT:  Right Ear: Tympanic membrane, external ear and ear canal normal.  Left Ear: Tympanic membrane and ear canal normal.  Nose: Mucosal edema  and rhinorrhea present. Right sinus exhibits maxillary sinus tenderness. Right sinus exhibits no frontal sinus tenderness. Left sinus exhibits maxillary sinus tenderness. Left sinus exhibits no frontal sinus tenderness.  Mouth/Throat: Uvula is midline. Posterior oropharyngeal erythema present. No oropharyngeal exudate.  Eyes: No scleral icterus.  Neck: Normal range of motion. Neck supple.  Cardiovascular: Normal rate and regular rhythm.   Pulmonary/Chest: Effort normal and breath sounds normal.  Lymphadenopathy:    He has no cervical adenopathy.  Neurological: He is alert and oriented to person, place, and time.  Vitals reviewed.   Lab Results  Component Value Date   WBC 7.1 07/08/2016   HGB 15.0 07/08/2016   HCT 44.3  07/08/2016   PLT 252.0 07/08/2016   GLUCOSE 102 (H) 07/08/2016   CHOL 198 12/18/2015   TRIG 70 01/07/2016   HDL 51.80 12/18/2015   LDLCALC 128 (H) 12/18/2015   ALT 18 07/08/2016   AST 21 07/08/2016   NA 138 07/08/2016   K 3.9 07/08/2016   CL 103 07/08/2016   CREATININE 1.05 07/08/2016   BUN 17 07/08/2016   CO2 29 07/08/2016   TSH 1.88 12/18/2015   PSA 1.01 12/18/2015   INR 1.10 12/29/2015   HGBA1C 5.8 12/18/2015    Ct Abdomen Pelvis W Contrast  Result Date: 08/03/2016 CLINICAL DATA:  Intermittent left upper quadrant pain for months. Negative ultrasound. EXAM: CT ABDOMEN AND PELVIS WITH CONTRAST TECHNIQUE: Multidetector CT imaging of the abdomen and pelvis was performed using the standard protocol following bolus administration of intravenous contrast. CONTRAST:  165mL ISOVUE-300 IOPAMIDOL (ISOVUE-300) INJECTION 61% COMPARISON:  None. FINDINGS: Lower chest and abdominal wall: Mild fatty enlargement of the right inguinal canal. Hepatobiliary: No focal liver abnormality.No evidence of biliary obstruction or stone. Pancreas: Unremarkable. Spleen: Unremarkable. Adrenals/Urinary Tract: Negative adrenals. No hydronephrosis or stone. Unremarkable bladder. Stomach/Bowel: Distal predominant colonic diverticulosis. No active inflammation. No obstruction or focal small bowel finding. Reproductive:Negative. Vascular/Lymphatic: Aortic atherosclerosis pre No acute vascular abnormality. No mass or adenopathy. Other: No ascites or pneumoperitoneum. Musculoskeletal: No acute or aggressive finding. Negative ribs at the left base. Lumbar degenerative disc disease greatest at L4-5 and L5-S1. IMPRESSION: 1. No acute finding or explanation for left upper quadrant pain. 2. Colonic diverticulosis. 3.  Aortic Atherosclerosis (ICD10-170.0) Electronically Signed   By: Monte Fantasia M.D.   On: 08/03/2016 09:55    Assessment & Plan:   [name] was seen today for sore throat.  Diagnoses and all orders for this  visit:  Acute URI -     ipratropium (ATROVENT) 0.03 % nasal spray; Place 2 sprays into both nostrils 2 (two) times daily. Do not use for more than 5days. -     dextromethorphan-guaiFENesin (MUCINEX DM) 30-600 MG 12hr tablet; Take 1 tablet by mouth 2 (two) times daily as needed for cough. -     benzonatate (TESSALON) 100 MG capsule; Take 1 capsule (100 mg total) by mouth 3 (three) times daily as needed for cough.   I am having Mr. Durr start on ipratropium, dextromethorphan-guaiFENesin, and benzonatate. I am also having him maintain his fluticasone, Multiple Vitamins-Minerals (CENTRUM SILVER PO), calcium carbonate, Bioflavonoid Products (ESTER C PO), LECITHIN PO, b complex vitamins, Omega 3, Vitamin D3, Co Q 10, Garlic, Magnesium, Multiple Vitamins-Minerals (OCUVITE EYE + MULTI PO), cholecalciferol, Cetirizine HCl (ZYRTEC ALLERGY PO), montelukast, and lisinopril-hydrochlorothiazide.  Meds ordered this encounter  Medications  . ipratropium (ATROVENT) 0.03 % nasal spray    Sig: Place 2 sprays into both nostrils 2 (two) times daily. Do  not use for more than 5days.    Dispense:  30 mL    Refill:  0    Order Specific Question:   Supervising Provider    Answer:   Cassandria Anger [[id number]]  . dextromethorphan-guaiFENesin (MUCINEX DM) 30-600 MG 12hr tablet    Sig: Take 1 tablet by mouth 2 (two) times daily as needed for cough.    Dispense:  14 tablet    Refill:  0    Order Specific Question:   Supervising Provider    Answer:   Cassandria Anger [[id number]]  . benzonatate (TESSALON) 100 MG capsule    Sig: Take 1 capsule (100 mg total) by mouth 3 (three) times daily as needed for cough.    Dispense:  20 capsule    Refill:  0    Order Specific Question:   Supervising Provider    Answer:   Cassandria Anger [[id number]]    Follow-up: No Follow-up on file.  Wilfred Lacy, NP

## 2017-01-30 NOTE — Progress Notes (Signed)
Pre visit review using our clinic review tool, if applicable. No additional management support is needed unless otherwise documented below in the visit note. 

## 2017-07-19 DIAGNOSIS — F6381 Intermittent explosive disorder: Secondary | ICD-10-CM | POA: Diagnosis not present

## 2017-07-19 DIAGNOSIS — F411 Generalized anxiety disorder: Secondary | ICD-10-CM | POA: Diagnosis not present

## 2017-08-07 DIAGNOSIS — Z1283 Encounter for screening for malignant neoplasm of skin: Secondary | ICD-10-CM | POA: Diagnosis not present

## 2017-08-07 DIAGNOSIS — L821 Other seborrheic keratosis: Secondary | ICD-10-CM | POA: Diagnosis not present

## 2017-08-07 DIAGNOSIS — X32XXXD Exposure to sunlight, subsequent encounter: Secondary | ICD-10-CM | POA: Diagnosis not present

## 2017-08-07 DIAGNOSIS — R208 Other disturbances of skin sensation: Secondary | ICD-10-CM | POA: Diagnosis not present

## 2017-08-07 DIAGNOSIS — L570 Actinic keratosis: Secondary | ICD-10-CM | POA: Diagnosis not present

## 2017-08-18 ENCOUNTER — Encounter: Payer: Self-pay | Admitting: Internal Medicine

## 2017-08-18 ENCOUNTER — Ambulatory Visit (INDEPENDENT_AMBULATORY_CARE_PROVIDER_SITE_OTHER): Payer: Medicare Other | Admitting: Internal Medicine

## 2017-08-18 DIAGNOSIS — H109 Unspecified conjunctivitis: Secondary | ICD-10-CM | POA: Insufficient documentation

## 2017-08-18 MED ORDER — ERYTHROMYCIN 5 MG/GM OP OINT: 1.0000 "application " | TOPICAL_OINTMENT | Freq: Four times a day (QID) | OPHTHALMIC | 0 refills | Status: DC

## 2017-08-18 NOTE — Assessment & Plan Note (Signed)
Mild to mod, for antibx course,  to f/u any worsening symptoms or concerns 

## 2017-08-18 NOTE — Patient Instructions (Signed)
Please take all new medication as prescribed - the antibiotic for the eye  Please continue all other medications as before, and refills have been done if requested.  Please have the pharmacy call with any other refills you may need.  Please keep your appointments with your specialists as you may have planned

## 2017-08-18 NOTE — Progress Notes (Signed)
Subjective:    Patient ID: Ryan Cobb, male    DOB: [birth year], 66 y.o.   MRN: [id number]  HPI  Here with 2 day onset left eye mild dull discomfort, gradually worsening until this am with mild redness, puffiness of the upper and lower eyelids as well, assoc with some vague periorbilat discomfort and HA, but no high fever, chills, ST, cough, and Pt denies chest pain, increased sob or doe, wheezing, orthopnea, PND, increased LE swelling, palpitations, dizziness or syncope. Past Medical History:  Diagnosis Date  . Arthritis   . Basal cell adenoma    history of  . Cervical disc disorder   . Colon polyps 2006   hyperplastic  . Difficult intubation   . GERD (gastroesophageal reflux disease)   . Hyperlipidemia   . Hypertension   . Seasonal rhinitis    Flonase as needed; Singulair daily   Past Surgical History:  Procedure Laterality Date  . abdominal cyst     removed in 1994  . ANTERIOR CERVICAL DECOMPRESSION/DISCECTOMY FUSION 4 LEVELS N/A 01/06/2016   Procedure: ANTERIOR CERVICAL DECOMPRESSION/DISCECTOMY FUSION 4 LEVELS;  Surgeon: Phylliss Bob, MD;  Location: [facility];  Service: Orthopedics;  Laterality: N/A;  Anterior cerivcal decompression fusion, cervical 3-4, cervical 4-5, cervical 5-6, cervical 6-7 with instrumentation and allograft  . APPENDECTOMY  1995  . CERVICAL FUSION  01/06/2016   POST  LEVEL 5  . CERVICAL LAMINECTOMY  2010   Dr. Carloyn Manner  . COLONOSCOPY W/ POLYPECTOMY  2006   negative 2011  . INGUINAL HERNIA REPAIR  2003   left  . KNEE ARTHROSCOPY  2007   Dr Berenice Primas  . [place]   Left Knee Surgery   . labrum tear & bone spur surgery  02/2013   Dr Berenice Primas  . LIPOMA EXCISION     Right Waist  . POSTERIOR CERVICAL FUSION/FORAMINOTOMY N/A 01/07/2016   Procedure: POSTERIOR CERVICAL FUSION LEVEL 5;  Surgeon: Phylliss Bob, MD;  Location: [facility];  Service: Orthopedics;  Laterality: N/A;  Posterior spinal fusion cervical 3-4, cervical 4-5, cervical 5-6, cervical 6-7,  cervical 7-thoracic 1 with instrumentation and allograft  . REPLACEMENT TOTAL KNEE  2009   Dr Berenice Primas    reports that he quit smoking about 37 years ago. His smoking use included Cigarettes. He has never used smokeless tobacco. He reports that he drinks about 1.8 - 2.4 oz of alcohol per week . He reports that he does not use drugs. family history includes Alcohol abuse in his father and mother; Breast cancer in his sister; Cirrhosis in his mother; Diabetes in his father; Hyperlipidemia in his maternal grandfather and paternal grandmother; Lung cancer in his paternal grandfather; Stroke (age of onset: 76) in his maternal grandfather. No Known Allergies Current Outpatient Prescriptions on File Prior to Visit  Medication Sig Dispense Refill  . b complex vitamins tablet Take 1 tablet by mouth daily.    Marland Kitchen Bioflavonoid Products (ESTER C PO) Take 500 mg by mouth daily.    . calcium carbonate (OS-CAL) 600 MG TABS tablet Take 600 mg by mouth daily.    . Cetirizine HCl (ZYRTEC ALLERGY PO) Take by mouth.    . cholecalciferol (VITAMIN D) 1000 units tablet Take 5,000 Units by mouth 4 (four) times a week.    . Cholecalciferol (VITAMIN D3) 3000 UNITS TABS Take by mouth. Reported on 12/23/2015    . Coenzyme Q10 (CO Q 10) 10 MG CAPS Take 10 mg by mouth daily.     Marland Kitchen  dextromethorphan-guaiFENesin (MUCINEX DM) 30-600 MG 12hr tablet Take 1 tablet by mouth 2 (two) times daily as needed for cough. 14 tablet 0  . fluticasone (FLONASE) 50 MCG/ACT nasal spray Place 1 spray into the nose 2 (two) times daily. (Patient taking differently: Place 1 spray into the nose daily as needed for allergies or rhinitis. ) 16 g 11  . Garlic 2505 MG CAPS Take 1,000 mg by mouth daily.     Marland Kitchen ipratropium (ATROVENT) 0.03 % nasal spray Place 2 sprays into both nostrils 2 (two) times daily. Do not use for more than 5days. 30 mL 0  . LECITHIN PO Take 1,200 mg by mouth daily.    Marland Kitchen lisinopril-hydrochlorothiazide (PRINZIDE,ZESTORETIC) 20-25 MG tablet  TAKE 1 TABLET EACH DAY. 90 tablet 3  . Magnesium 250 MG TABS Take 250 mg by mouth daily.     . montelukast (SINGULAIR) 10 MG tablet TAKE ONE TABLET AT BEDTIME. 90 tablet 3  . Multiple Vitamins-Minerals (CENTRUM SILVER PO) Take 1 tablet by mouth daily.     . Multiple Vitamins-Minerals (OCUVITE EYE + MULTI PO) Take 1 tablet by mouth daily.     . Omega 3 1200 MG CAPS Take 1,200 mg by mouth daily.      No current facility-administered medications on file prior to visit.    Review of Systems All otherwise neg per pt    Objective:   Physical Exam BP 124/78   Pulse 74   Temp 98 F (36.7 C) (Oral)   Ht 5\' 7"  (1.702 m)   Wt 173 lb (78.5 kg)   SpO2 99%   BMI 27.10 kg/m  VS noted, non toxic Constitutional: Pt appears in NAD HENT: Head: NCAT.  Right Ear: External ear normal.  Left Ear: External ear normal.  Eyes: . Pupils are equal, round, and reactive to light. Left Conjunctivae with erythem injection and glassy d/c ? Somewhat colored, with nontender erythem mild swelling of the upper and lower eyelids, and EOM are normal Nose: without d/c or deformity Neck: Neck supple. Gross normal ROM Cardiovascular: Normal rate and regular rhythm.   Pulmonary/Chest: Effort normal and breath sounds without rales or wheezing.  Abd:  Soft, NT, ND, + BS, no organomegaly Neurological: Pt is alert. At baseline orientation, motor grossly intact, cn 2-12 intact Skin: Skin is warm. No rashes, other new lesions, no LE edema Psychiatric: Pt behavior is normal without agitation  No other exam findings    Assessment & Plan:

## 2017-08-31 DIAGNOSIS — H25099 Other age-related incipient cataract, unspecified eye: Secondary | ICD-10-CM | POA: Diagnosis not present

## 2017-09-04 NOTE — Progress Notes (Signed)
Pre visit review using our clinic review tool, if applicable. No additional management support is needed unless otherwise documented below in the visit note. 

## 2017-09-04 NOTE — Progress Notes (Signed)
Subjective:   Ryan Cobb is a 66 y.o. male who presents for an Initial Medicare Annual Wellness Visit.  Review of Systems  No ROS.  Medicare Wellness Visit. Additional risk factors are reflected in the social history.  Cardiac Risk Factors include: advanced age (>72men, >74 women);male gender;hypertension;dyslipidemia Sleep patterns: feels rested on waking, gets up 1-2 times nightly to void and sleeps 7 hours nightly.    Home Safety/Smoke Alarms: Feels safe in home. Smoke alarms in place.  Living environment; residence and Firearm Safety: 2-story house, no firearms., Lives with wife, no needs for DME, good support system Seat Belt Safety/Bike Helmet: Wears seat belt.    Male:   CCS- Last 05/26/15, recall 5 years   PSA-  Lab Results  Component Value Date   PSA 1.01 12/18/2015   PSA 1.04 04/25/2014   PSA 0.82 01/01/2013      Objective:    Today's Vitals   09/06/17 0806  BP: 130/72  Pulse: (!) 58  Resp: 20  Temp: 98.1 F (36.7 C)  SpO2: 98%  Weight: 172 lb (78 kg)  Height: 5\' 7"  (1.702 m)  PainSc: 2    Body mass index is 26.94 kg/m.  Current Medications (verified) Outpatient Encounter Prescriptions as of 09/06/2017  Medication Sig  . b complex vitamins tablet Take 1 tablet by mouth daily.  Marland Kitchen Bioflavonoid Products (ESTER C PO) Take 500 mg by mouth daily.  . calcium carbonate (OS-CAL) 600 MG TABS tablet Take 600 mg by mouth daily.  . Cetirizine HCl (ZYRTEC ALLERGY PO) Take by mouth.  . cholecalciferol (VITAMIN D) 1000 units tablet Take 5,000 Units by mouth 4 (four) times a week.  . Cholecalciferol (VITAMIN D3) 3000 UNITS TABS Take by mouth. Reported on 12/23/2015  . Coenzyme Q10 (CO Q 10) 10 MG CAPS Take 10 mg by mouth daily.   . fluticasone (FLONASE) 50 MCG/ACT nasal spray Place 1 spray into the nose 2 (two) times daily. (Patient taking differently: Place 1 spray into the nose daily as needed for allergies or rhinitis. )  . Garlic 0938 MG CAPS Take 1,000 mg by  mouth daily.   Marland Kitchen LECITHIN PO Take 1,200 mg by mouth daily.  Marland Kitchen lisinopril-hydrochlorothiazide (PRINZIDE,ZESTORETIC) 20-25 MG tablet TAKE 1 TABLET EACH DAY.  . Magnesium 250 MG TABS Take 250 mg by mouth daily.   . montelukast (SINGULAIR) 10 MG tablet TAKE ONE TABLET AT BEDTIME.  . Multiple Vitamins-Minerals (CENTRUM SILVER PO) Take 1 tablet by mouth daily.   . Multiple Vitamins-Minerals (OCUVITE EYE + MULTI PO) Take 1 tablet by mouth daily.   . Omega 3 1200 MG CAPS Take 1,200 mg by mouth daily.   . [DISCONTINUED] dextromethorphan-guaiFENesin (MUCINEX DM) 30-600 MG 12hr tablet Take 1 tablet by mouth 2 (two) times daily as needed for cough. (Patient not taking: Reported on 09/06/2017)  . [DISCONTINUED] erythromycin ophthalmic ointment Place 1 application into the left eye 4 (four) times daily.  . [DISCONTINUED] ipratropium (ATROVENT) 0.03 % nasal spray Place 2 sprays into both nostrils 2 (two) times daily. Do not use for more than 5days. (Patient not taking: Reported on 09/06/2017)   No facility-administered encounter medications on file as of 09/06/2017.     Allergies (verified) Patient has no known allergies.   History: Past Medical History:  Diagnosis Date  . Arthritis   . Basal cell adenoma    history of  . Cervical disc disorder   . Colon polyps 2006   hyperplastic  . Difficult intubation   .  GERD (gastroesophageal reflux disease)   . Hyperlipidemia   . Hypertension   . Seasonal rhinitis    Flonase as needed; Singulair daily   Past Surgical History:  Procedure Laterality Date  . abdominal cyst     removed in 1994  . ANTERIOR CERVICAL DECOMPRESSION/DISCECTOMY FUSION 4 LEVELS N/A 01/06/2016   Procedure: ANTERIOR CERVICAL DECOMPRESSION/DISCECTOMY FUSION 4 LEVELS;  Surgeon: Phylliss Bob, MD;  Location: [place];  Service: Orthopedics;  Laterality: N/A;  Anterior cerivcal decompression fusion, cervical 3-4, cervical 4-5, cervical 5-6, cervical 6-7 with instrumentation and allograft  .  APPENDECTOMY  1995  . CERVICAL FUSION  01/06/2016   POST  LEVEL 5  . CERVICAL LAMINECTOMY  2010   Dr. Carloyn Manner  . COLONOSCOPY W/ POLYPECTOMY  2006   negative 2011  . INGUINAL HERNIA REPAIR  2003   left  . KNEE ARTHROSCOPY  2007   Dr Berenice Primas  . [place]   Left Knee Surgery   . labrum tear & bone spur surgery  02/2013   Dr Berenice Primas  . LIPOMA EXCISION     Right Waist  . POSTERIOR CERVICAL FUSION/FORAMINOTOMY N/A 01/07/2016   Procedure: POSTERIOR CERVICAL FUSION LEVEL 5;  Surgeon: Phylliss Bob, MD;  Location: [place];  Service: Orthopedics;  Laterality: N/A;  Posterior spinal fusion cervical 3-4, cervical 4-5, cervical 5-6, cervical 6-7, cervical 7-thoracic 1 with instrumentation and allograft  . REPLACEMENT TOTAL KNEE  2009   Dr Berenice Primas   Family History  Problem Relation Age of Onset  . Alcohol abuse Father   . Diabetes Father   . Alcohol abuse Mother   . Cirrhosis Mother   . Breast cancer Sister   . Lung cancer Paternal Grandfather   . Hyperlipidemia Paternal Grandmother   . Hyperlipidemia Maternal Grandfather   . Stroke Maternal Grandfather 47  . Heart attack Neg Hx    Social History   Occupational History  . Not on file.   Social History Main Topics  . Smoking status: Former Smoker    Types: Cigarettes    Quit date: 12/20/1979  . Smokeless tobacco: Never Used     Comment: smoked 1967-1981, up to 1 ppd  . Alcohol use 1.8 - 2.4 oz/week    3 - 4 Standard drinks or equivalent per week     Comment: occasional beer   . Drug use: No  . Sexual activity: Yes    Partners: Female   Tobacco Counseling Counseling given: Not Answered   Activities of Daily Living In your present state of health, do you have any difficulty performing the following activities: 09/06/2017  Hearing? N  Vision? N  Difficulty concentrating or making decisions? N  Walking or climbing stairs? N  Dressing or bathing? N  Doing errands, shopping? N  Preparing Food and eating ? N  Using the Toilet?  N  In the past six months, have you accidently leaked urine? N  Do you have problems with loss of bowel control? N  Managing your Medications? N  Managing your Finances? N  Housekeeping or managing your Housekeeping? N  Some recent data might be hidden    Immunizations and Health Maintenance Immunization History  Administered Date(s) Administered  . Influenza Whole 09/18/2012  . Influenza, High Dose Seasonal PF 09/30/2016, 09/06/2017  . Influenza-Unspecified 10/12/2013, 09/18/2014, 09/20/2015  . Pneumococcal Conjugate-13 12/18/2015  . Pneumococcal Polysaccharide-23 09/06/2017  . Tdap 02/03/2012  . Zoster 10/18/2012   There are no preventive care reminders to display for this patient.  Patient Care Team: Binnie Rail, MD as PCP - General (Internal Medicine)  Indicate any recent Medical Services you may have received from other than [facility] providers in the past year (date may be approximate).    Assessment:   This is a routine wellness examination for [place]. Physical assessment deferred to PCP.   Hearing/Vision screen Hearing Screening Comments: Tinnitis, passed whisper test Vision Screening Comments: appointment yearly Dr. Einar Gip  Dietary issues and exercise activities discussed: Current Exercise Habits: Home exercise routine, Type of exercise: stretching;strength training/weights;calisthenics (bike riding), Time (Minutes): 50, Frequency (Times/Week): 6, Weekly Exercise (Minutes/Week): 300, Intensity: Moderate, Exercise limited by: None identified  Diet (meal preparation, eat out, water intake, caffeinated beverages, dairy products, fruits and vegetables): in general, a "healthy" diet  , well balanced, eats a variety of fruits and vegetables daily, limits salt, fat/cholesterol, sugar, caffeine, drinks 2-3 glasses of water daily.    Encouraged patient to increase daily water intake.    Goals    . Stay as active and as independent as possible      Depression  Screen PHQ 2/9 Scores 09/06/2017 09/30/2016 01/23/2015  PHQ - 2 Score 2 0 0  PHQ- 9 Score 5 - -    Fall Risk Fall Risk  09/06/2017 09/30/2016 01/23/2015  Falls in the past year? No No No    Cognitive Function:       Ad8 score reviewed for issues:  Issues making decisions: no  Less interest in hobbies / activities: no  Repeats questions, stories (family complaining): no  Trouble using ordinary gadgets (microwave, computer, phone):no  Forgets the month or year: no  Mismanaging finances: no  Remembering appts: no  Daily problems with thinking and/or memory: no Ad8 score is= 0 Screening Tests Health Maintenance  Topic Date Due  . COLONOSCOPY  05/25/2020  . TETANUS/TDAP  02/02/2022  . INFLUENZA VACCINE  Completed  . Hepatitis C Screening  Completed  . PNA vac Low Risk Adult  Completed        Plan:    Continue doing brain stimulating activities (puzzles, reading, adult coloring books, staying active) to keep memory sharp.   Continue to eat heart healthy diet (full of fruits, vegetables, whole grains, lean protein, water--limit salt, fat, and sugar intake) and increase physical activity as tolerated.  I have personally reviewed and noted the following in the patient's chart:   . Medical and social history . Use of alcohol, tobacco or illicit drugs  . Current medications and supplements . Functional ability and status . Nutritional status . Physical activity . Advanced directives . List of other physicians . Vitals . Screenings to include cognitive, depression, and falls . Referrals and appointments  In addition, I have reviewed and discussed with patient certain preventive protocols, quality metrics, and best practice recommendations. A written personalized care plan for preventive services as well as general preventive health recommendations were provided to patient.     Michiel Cowboy, RN   09/06/2017      Medical screening examination/treatment/procedure(s)  were performed by non-physician practitioner and as supervising physician I was immediately available for consultation/collaboration. I agree with above. Binnie Rail, MD

## 2017-09-05 NOTE — Progress Notes (Signed)
Subjective:    Patient ID: Ryan Cobb, male    DOB: 1951-02-27, 66 y.o.   MRN: [id number]  HPI The patient is here for follow up.  Hypertension: He is taking his medication daily. He is compliant with a low sodium diet.  He denies chest pain, palpitations, edema, shortness of breath and regular headaches. He is exercising regularly.  He does not monitor his blood pressure at home.    Hyperlipidemia: He is not on any medication. He is controlling his cholesterol with lifestyle.  He is compliant with a low fat/cholesterol diet. He is exercising regularly.   Neck pain:  He had surgery and since then has intermittent neck pain.  He thinks is from sleeping.  His neck has to be in certain positions.  Aleve, heat, bengay or massage helps.  Has pain and soreness 1-2 days a week.    Medications and allergies reviewed with patient and updated if appropriate.  Patient Active Problem List   Diagnosis Date Noted  . Left conjunctivitis 08/18/2017  . Slipped rib syndrome 11/02/2016  . Nonallopathic lesion of thoracic region 11/02/2016  . Nonallopathic lesion of rib cage 11/02/2016  . Nonallopathic lesion of lumbosacral region 11/02/2016  . Erectile dysfunction 09/30/2016  . Right-sided chest pain 09/30/2016  . LUQ pain 07/06/2016  . Pulmonary edema   . Difficult intubation   . Respiratory failure ([place])   . Radiculopathy 01/06/2016  . Cervical disc disorder with radiculopathy of cervical region 12/18/2015  . Acute sinusitis 11/18/2015  . Nonspecific abnormal electrocardiogram (ECG) (EKG) 01/01/2013  . Hyperlipidemia 03/04/2010  . Essential hypertension 03/04/2010  . ALLERGIC RHINITIS 03/04/2010  . History of colonic polyps 03/04/2010    Current Outpatient Prescriptions on File Prior to Visit  Medication Sig Dispense Refill  . b complex vitamins tablet Take 1 tablet by mouth daily.    Marland Kitchen Bioflavonoid Products (ESTER C PO) Take 500 mg by mouth daily.    . calcium carbonate (OS-CAL)  600 MG TABS tablet Take 600 mg by mouth daily.    . Cetirizine HCl (ZYRTEC ALLERGY PO) Take by mouth.    . cholecalciferol (VITAMIN D) 1000 units tablet Take 5,000 Units by mouth 4 (four) times a week.    . Cholecalciferol (VITAMIN D3) 3000 UNITS TABS Take by mouth. Reported on 12/23/2015    . Coenzyme Q10 (CO Q 10) 10 MG CAPS Take 10 mg by mouth daily.     . fluticasone (FLONASE) 50 MCG/ACT nasal spray Place 1 spray into the nose 2 (two) times daily. (Patient taking differently: Place 1 spray into the nose daily as needed for allergies or rhinitis. ) 16 g 11  . Garlic 8921 MG CAPS Take 1,000 mg by mouth daily.     Marland Kitchen LECITHIN PO Take 1,200 mg by mouth daily.    Marland Kitchen lisinopril-hydrochlorothiazide (PRINZIDE,ZESTORETIC) 20-25 MG tablet TAKE 1 TABLET EACH DAY. 90 tablet 3  . Magnesium 250 MG TABS Take 250 mg by mouth daily.     . montelukast (SINGULAIR) 10 MG tablet TAKE ONE TABLET AT BEDTIME. 90 tablet 3  . Multiple Vitamins-Minerals (CENTRUM SILVER PO) Take 1 tablet by mouth daily.     . Multiple Vitamins-Minerals (OCUVITE EYE + MULTI PO) Take 1 tablet by mouth daily.     . Omega 3 1200 MG CAPS Take 1,200 mg by mouth daily.      No current facility-administered medications on file prior to visit.     Past Medical History:  Diagnosis  Date  . Arthritis   . Basal cell adenoma    history of  . Cervical disc disorder   . Colon polyps 2006   hyperplastic  . Difficult intubation   . GERD (gastroesophageal reflux disease)   . Hyperlipidemia   . Hypertension   . Seasonal rhinitis    Flonase as needed; Singulair daily    Past Surgical History:  Procedure Laterality Date  . abdominal cyst     removed in 1994  . ANTERIOR CERVICAL DECOMPRESSION/DISCECTOMY FUSION 4 LEVELS N/A 01/06/2016   Procedure: ANTERIOR CERVICAL DECOMPRESSION/DISCECTOMY FUSION 4 LEVELS;  Surgeon: Phylliss Bob, MD;  Location: [place];  Service: Orthopedics;  Laterality: N/A;  Anterior cerivcal decompression fusion, cervical 3-4,  cervical 4-5, cervical 5-6, cervical 6-7 with instrumentation and allograft  . APPENDECTOMY  1995  . CERVICAL FUSION  01/06/2016   POST  LEVEL 5  . CERVICAL LAMINECTOMY  2010   Dr. Carloyn Manner  . COLONOSCOPY W/ POLYPECTOMY  2006   negative 2011  . INGUINAL HERNIA REPAIR  2003   left  . KNEE ARTHROSCOPY  2007   Dr Berenice Primas  . [place]   Left Knee Surgery   . labrum tear & bone spur surgery  02/2013   Dr Berenice Primas  . LIPOMA EXCISION     Right Waist  . POSTERIOR CERVICAL FUSION/FORAMINOTOMY N/A 01/07/2016   Procedure: POSTERIOR CERVICAL FUSION LEVEL 5;  Surgeon: Phylliss Bob, MD;  Location: [place];  Service: Orthopedics;  Laterality: N/A;  Posterior spinal fusion cervical 3-4, cervical 4-5, cervical 5-6, cervical 6-7, cervical 7-thoracic 1 with instrumentation and allograft  . REPLACEMENT TOTAL KNEE  2009   Dr Berenice Primas    Social History   Social History  . Marital status: Married    Spouse name: N/A  . Number of children: N/A  . Years of education: N/A   Social History Main Topics  . Smoking status: Former Smoker    Types: Cigarettes    Quit date: 12/20/1979  . Smokeless tobacco: Never Used     Comment: smoked 1967-1981, up to 1 ppd  . Alcohol use 1.8 - 2.4 oz/week    3 - 4 Standard drinks or equivalent per week     Comment: occasional beer   . Drug use: No  . Sexual activity: Yes    Partners: Female   Other Topics Concern  . None   Social History Narrative  . None    Family History  Problem Relation Age of Onset  . Alcohol abuse Father   . Diabetes Father   . Alcohol abuse Mother   . Cirrhosis Mother   . Breast cancer Sister   . Lung cancer Paternal Grandfather   . Hyperlipidemia Paternal Grandmother   . Hyperlipidemia Maternal Grandfather   . Stroke Maternal Grandfather 31  . Heart attack Neg Hx     Review of Systems  Constitutional: Negative for chills and fever.  HENT: Positive for postnasal drip.   Respiratory: Negative for cough, shortness of breath and  wheezing.   Cardiovascular: Negative for chest pain, palpitations and leg swelling.  Gastrointestinal: Negative for abdominal pain and nausea.  Musculoskeletal: Positive for neck pain.  Neurological: Positive for light-headedness (occ) and headaches (occ from neck pain). Negative for dizziness.       Objective:   Vitals:   09/06/17 0806  BP: 130/72  Pulse: (!) 58  Resp: 20  Temp: 98.1 F (36.7 C)  SpO2: 98%   Wt Readings from Last  3 Encounters:  09/06/17 172 lb (78 kg)  08/18/17 173 lb (78.5 kg)  01/30/17 175 lb (79.4 kg)   Body mass index is 26.94 kg/m.   Physical Exam    Constitutional: Appears well-developed and well-nourished. No distress.  HENT:  Head: Normocephalic and atraumatic.  Neck: Neck supple. No tracheal deviation present. No thyromegaly present.  No cervical lymphadenopathy Cardiovascular: Normal rate, regular rhythm and normal heart sounds.   No murmur heard. No carotid bruit .  No edema Pulmonary/Chest: Effort normal and breath sounds normal. No respiratory distress. No has no wheezes. No rales.  Skin: Skin is warm and dry. Not diaphoretic.  Psychiatric: Normal mood and affect. Behavior is normal.      Assessment & Plan:    See Problem List for Assessment and Plan of chronic medical problems.

## 2017-09-06 ENCOUNTER — Ambulatory Visit (INDEPENDENT_AMBULATORY_CARE_PROVIDER_SITE_OTHER): Payer: Medicare Other | Admitting: Internal Medicine

## 2017-09-06 ENCOUNTER — Other Ambulatory Visit (INDEPENDENT_AMBULATORY_CARE_PROVIDER_SITE_OTHER): Payer: Medicare Other

## 2017-09-06 ENCOUNTER — Encounter: Payer: Self-pay | Admitting: Internal Medicine

## 2017-09-06 VITALS — BP 130/72 | HR 58 | Temp 98.1°F | Resp 20 | Ht 67.0 in | Wt 172.0 lb

## 2017-09-06 DIAGNOSIS — I10 Essential (primary) hypertension: Secondary | ICD-10-CM

## 2017-09-06 DIAGNOSIS — Z23 Encounter for immunization: Secondary | ICD-10-CM | POA: Diagnosis not present

## 2017-09-06 DIAGNOSIS — Z0000 Encounter for general adult medical examination without abnormal findings: Secondary | ICD-10-CM | POA: Diagnosis not present

## 2017-09-06 DIAGNOSIS — E785 Hyperlipidemia, unspecified: Secondary | ICD-10-CM | POA: Diagnosis not present

## 2017-09-06 DIAGNOSIS — R739 Hyperglycemia, unspecified: Secondary | ICD-10-CM

## 2017-09-06 LAB — CBC WITH DIFFERENTIAL/PLATELET
BASOS PCT: 0.8 % (ref 0.0–3.0)
Basophils Absolute: 0.1 10*3/uL (ref 0.0–0.1)
EOS PCT: 1.9 % (ref 0.0–5.0)
Eosinophils Absolute: 0.1 10*3/uL (ref 0.0–0.7)
HCT: 44.6 % (ref 39.0–52.0)
Hemoglobin: 15.4 g/dL (ref 13.0–17.0)
LYMPHS ABS: 2 10*3/uL (ref 0.7–4.0)
Lymphocytes Relative: 25.3 % (ref 12.0–46.0)
MCHC: 34.5 g/dL (ref 30.0–36.0)
MCV: 90.9 fl (ref 78.0–100.0)
MONO ABS: 0.7 10*3/uL (ref 0.1–1.0)
MONOS PCT: 8.8 % (ref 3.0–12.0)
NEUTROS PCT: 63.2 % (ref 43.0–77.0)
Neutro Abs: 5 10*3/uL (ref 1.4–7.7)
Platelets: 249 10*3/uL (ref 150.0–400.0)
RBC: 4.91 Mil/uL (ref 4.22–5.81)
RDW: 13.3 % (ref 11.5–15.5)
WBC: 7.9 10*3/uL (ref 4.0–10.5)

## 2017-09-06 LAB — COMPREHENSIVE METABOLIC PANEL
ALBUMIN: 4.4 g/dL (ref 3.5–5.2)
ALT: 18 U/L (ref 0–53)
AST: 24 U/L (ref 0–37)
Alkaline Phosphatase: 53 U/L (ref 39–117)
BUN: 16 mg/dL (ref 6–23)
CHLORIDE: 102 meq/L (ref 96–112)
CO2: 29 meq/L (ref 19–32)
CREATININE: 1.04 mg/dL (ref 0.40–1.50)
Calcium: 9.9 mg/dL (ref 8.4–10.5)
GFR: 75.55 mL/min (ref >60.00)
GLUCOSE: 113 mg/dL — AB (ref 70–99)
Potassium: 4.3 mEq/L (ref 3.5–5.1)
Sodium: 138 mEq/L (ref 135–145)
Total Bilirubin: 0.6 mg/dL (ref 0.2–1.2)
Total Protein: 7.4 g/dL (ref 6.0–8.3)

## 2017-09-06 LAB — LIPID PANEL
CHOL/HDL RATIO: 3
Cholesterol: 198 mg/dL (ref 0–200)
HDL: 66.8 mg/dL (ref >39.00)
LDL CALC: 116 mg/dL — AB (ref 0–99)
NONHDL: 131.5
Triglycerides: 79 mg/dL (ref 0.0–149.0)
VLDL: 15.8 mg/dL (ref 0.0–40.0)

## 2017-09-06 LAB — HEMOGLOBIN A1C: Hgb A1c MFr Bld: 5.5 % (ref 4.6–6.5)

## 2017-09-06 LAB — TSH: TSH: 1.79 u[IU]/mL (ref 0.35–4.50)

## 2017-09-06 NOTE — Assessment & Plan Note (Signed)
Check a1c 

## 2017-09-06 NOTE — Assessment & Plan Note (Signed)
BP well controlled Current regimen effective and well tolerated Continue current medications at current doses Cmp, tsh, cbc 

## 2017-09-06 NOTE — Patient Instructions (Addendum)
Continue doing brain stimulating activities (puzzles, reading, adult coloring books, staying active) to keep memory sharp.   Continue to eat heart healthy diet (full of fruits, vegetables, whole grains, lean protein, water--limit salt, fat, and sugar intake) and increase physical activity as tolerated.   Ryan Cobb , Thank you for taking time to come for your Medicare Wellness Visit. I appreciate your ongoing commitment to your health goals. Please review the following plan we discussed and let me know if I can assist you in the future.   These are the goals we discussed: Goals    . Stay as active and as independent as possible       This is a list of the screening recommended for you and due dates:  Health Maintenance  Topic Date Due  . Pneumonia vaccines (2 of 2 - PPSV23) 12/17/2016  . Flu Shot  07/19/2017  . Colon Cancer Screening  05/25/2020  . Tetanus Vaccine  02/02/2022  .  Hepatitis C: One time screening is recommended by Center for Disease Control  (CDC) for  adults born from [birth year] through 1966.   Completed   Influenza Virus Vaccine injection What is this medicine? INFLUENZA VIRUS VACCINE (in floo EN zuh VAHY ruhs vak SEEN) helps to reduce the risk of getting influenza also known as the flu. The vaccine only helps protect you against some strains of the flu. This medicine may be used for other purposes; ask your health care provider or pharmacist if you have questions. COMMON BRAND NAME(S): Afluria, Agriflu, Alfuria, FLUAD, Fluarix, Fluarix Quadrivalent, Flublok, Flublok Quadrivalent, FLUCELVAX, Flulaval, Fluvirin, Fluzone, Fluzone High-Dose, Fluzone Intradermal What should I tell my health care provider before I take this medicine? They need to know if you have any of these conditions: -bleeding disorder like hemophilia -fever or infection -Guillain-Barre syndrome or other neurological problems -immune system problems -infection with the human immunodeficiency virus (HIV) or  AIDS -low blood platelet counts -multiple sclerosis -an unusual or allergic reaction to influenza virus vaccine, latex, other medicines, foods, dyes, or preservatives. Different brands of vaccines contain different allergens. Some may contain latex or eggs. Talk to your doctor about your allergies to make sure that you get the right vaccine. -pregnant or trying to get pregnant -breast-feeding How should I use this medicine? This vaccine is for injection into a muscle or under the skin. It is given by a health care professional. A copy of Vaccine Information Statements will be given before each vaccination. Read this sheet carefully each time. The sheet may change frequently. Talk to your healthcare provider to see which vaccines are right for you. Some vaccines should not be used in all age groups. Overdosage: If you think you have taken too much of this medicine contact a poison control center or emergency room at once. NOTE: This medicine is only for you. Do not share this medicine with others. What if I miss a dose? This does not apply. What may interact with this medicine? -chemotherapy or radiation therapy -medicines that lower your immune system like etanercept, anakinra, infliximab, and adalimumab -medicines that treat or prevent blood clots like warfarin -phenytoin -steroid medicines like prednisone or cortisone -theophylline -vaccines This list may not describe all possible interactions. Give your health care provider a list of all the medicines, herbs, non-prescription drugs, or dietary supplements you use. Also tell them if you smoke, drink alcohol, or use illegal drugs. Some items may interact with your medicine. What should I watch for while using this medicine?  Report any side effects that do not go away within 3 days to your doctor or health care professional. Call your health care provider if any unusual symptoms occur within 6 weeks of receiving this vaccine. You may still  catch the flu, but the illness is not usually as bad. You cannot get the flu from the vaccine. The vaccine will not protect against colds or other illnesses that may cause fever. The vaccine is needed every year. What side effects may I notice from receiving this medicine? Side effects that you should report to your doctor or health care professional as soon as possible: -allergic reactions like skin rash, itching or hives, swelling of the face, lips, or tongue Side effects that usually do not require medical attention (report to your doctor or health care professional if they continue or are bothersome): -fever -headache -muscle aches and pains -pain, tenderness, redness, or swelling at the injection site -tiredness This list may not describe all possible side effects. Call your doctor for medical advice about side effects. You may report side effects to FDA at 1-800-FDA-1088. Where should I keep my medicine? The vaccine will be given by a health care professional in a clinic, pharmacy, doctor's office, or other health care setting. You will not be given vaccine doses to store at home. NOTE: This sheet is a summary. It may not cover all possible information. If you have questions about this medicine, talk to your doctor, pharmacist, or health care provider.  2018 Elsevier/Gold Standard (2015-06-26 10:07:28)

## 2017-09-06 NOTE — Assessment & Plan Note (Signed)
Not on medication. Check lipid panel.  

## 2017-09-07 ENCOUNTER — Encounter: Payer: Self-pay | Admitting: Internal Medicine

## 2017-09-27 ENCOUNTER — Ambulatory Visit (INDEPENDENT_AMBULATORY_CARE_PROVIDER_SITE_OTHER): Payer: Medicare Other | Admitting: Nurse Practitioner

## 2017-09-27 ENCOUNTER — Encounter: Payer: Self-pay | Admitting: Nurse Practitioner

## 2017-09-27 VITALS — BP 120/76 | HR 57 | Temp 98.5°F | Ht 67.0 in | Wt 176.0 lb

## 2017-09-27 DIAGNOSIS — J0140 Acute pansinusitis, unspecified: Secondary | ICD-10-CM

## 2017-09-27 DIAGNOSIS — J209 Acute bronchitis, unspecified: Secondary | ICD-10-CM

## 2017-09-27 MED ORDER — HYDROCODONE-HOMATROPINE 5-1.5 MG/5ML PO SYRP: 5.0000 mL | ORAL_SOLUTION | Freq: Two times a day (BID) | ORAL | 0 refills | Status: DC | PRN

## 2017-09-27 MED ORDER — DM-GUAIFENESIN ER 30-600 MG PO TB12: 1.0000 | ORAL_TABLET | Freq: Two times a day (BID) | ORAL | 0 refills | Status: DC | PRN

## 2017-09-27 MED ORDER — IPRATROPIUM BROMIDE 0.03 % NA SOLN: 2.0000 | Freq: Two times a day (BID) | NASAL | 0 refills | Status: DC

## 2017-09-27 MED ORDER — LEVOFLOXACIN 500 MG PO TABS: 500.0000 mg | ORAL_TABLET | Freq: Every day | ORAL | 0 refills | Status: DC

## 2017-09-27 MED ORDER — METHYLPREDNISOLONE ACETATE 40 MG/ML IJ SUSP
40.0000 mg | Freq: Once | INTRAMUSCULAR | Status: AC
  Administered 2017-09-27: 40 mg via INTRAMUSCULAR

## 2017-09-27 NOTE — Patient Instructions (Signed)
URI Instructions: Encourage adequate oral hydration.  Use over-the-counter  "cold" medicines  such as "Tylenol cold" , "Advil cold",  "Mucinex" or" Mucinex D"  for cough and congestion.  Avoid decongestants if you have high blood pressure. Use" Delsym" or" Robitussin" cough syrup varietis for cough.  You can use plain "Tylenol" or "Advi"l for fever, chills and achyness.  Call office if no improvement in 2weeks.

## 2017-09-27 NOTE — Progress Notes (Signed)
Subjective:  Patient ID: Ryan Cobb, male    DOB: [birth year]  Age: 66 y.o. MRN: [id number]  CC: Cough (coughing yellow mucus,congestion,sore throat,cant sleep, going on for 5 days. took mucinex OTC)   Cough  This is a new problem. The current episode started in the past 7 days. The problem has been gradually worsening. The problem occurs constantly. The cough is productive of sputum. Associated symptoms include chills, headaches, myalgias, nasal congestion, postnasal drip, rhinorrhea, a sore throat and shortness of breath. He has tried OTC cough suppressant for the symptoms. The treatment provided no relief. His past medical history is significant for bronchitis and environmental allergies.    Outpatient Medications Prior to Visit  Medication Sig Dispense Refill  . b complex vitamins tablet Take 1 tablet by mouth daily.    Marland Kitchen Bioflavonoid Products (ESTER C PO) Take 500 mg by mouth daily.    . calcium carbonate (OS-CAL) 600 MG TABS tablet Take 600 mg by mouth daily.    . Cetirizine HCl (ZYRTEC ALLERGY PO) Take by mouth.    . cholecalciferol (VITAMIN D) 1000 units tablet Take 5,000 Units by mouth 4 (four) times a week.    . Cholecalciferol (VITAMIN D3) 3000 UNITS TABS Take by mouth. Reported on 12/23/2015    . Coenzyme Q10 (CO Q 10) 10 MG CAPS Take 10 mg by mouth daily.     . fluticasone (FLONASE) 50 MCG/ACT nasal spray Place 1 spray into the nose 2 (two) times daily. (Patient taking differently: Place 1 spray into the nose daily as needed for allergies or rhinitis. ) 16 g 11  . Garlic 7672 MG CAPS Take 1,000 mg by mouth daily.     Marland Kitchen LECITHIN PO Take 1,200 mg by mouth daily.    Marland Kitchen lisinopril-hydrochlorothiazide (PRINZIDE,ZESTORETIC) 20-25 MG tablet TAKE 1 TABLET EACH DAY. 90 tablet 3  . Magnesium 250 MG TABS Take 250 mg by mouth daily.     . montelukast (SINGULAIR) 10 MG tablet TAKE ONE TABLET AT BEDTIME. 90 tablet 3  . Multiple Vitamins-Minerals (CENTRUM SILVER PO) Take 1 tablet by mouth  daily.     . Multiple Vitamins-Minerals (OCUVITE EYE + MULTI PO) Take 1 tablet by mouth daily.     . Omega 3 1200 MG CAPS Take 1,200 mg by mouth daily.      No facility-administered medications prior to visit.     ROS See HPI  Objective:  BP 120/76   Pulse (!) 57   Temp 98.5 F (36.9 C)   Ht 5\' 7"  (1.702 m)   Wt 176 lb (79.8 kg)   SpO2 98%   BMI 27.57 kg/m   BP Readings from Last 3 Encounters:  09/27/17 120/76  09/06/17 130/72  08/18/17 124/78    Wt Readings from Last 3 Encounters:  09/27/17 176 lb (79.8 kg)  09/06/17 172 lb (78 kg)  08/18/17 173 lb (78.5 kg)    Physical Exam  Constitutional: He is oriented to person, place, and time. No distress.  HENT:  Right Ear: Tympanic membrane, external ear and ear canal normal.  Left Ear: Tympanic membrane and ear canal normal.  Nose: Mucosal edema and rhinorrhea present. Right sinus exhibits maxillary sinus tenderness and frontal sinus tenderness. Left sinus exhibits maxillary sinus tenderness and frontal sinus tenderness.  Mouth/Throat: Uvula is midline. Posterior oropharyngeal erythema present. No oropharyngeal exudate.  Eyes: No scleral icterus.  Neck: Normal range of motion. Neck supple.  Cardiovascular: Normal rate and regular rhythm.   Pulmonary/Chest:  Effort normal and breath sounds normal.  Lymphadenopathy:    He has cervical adenopathy.  Neurological: He is alert and oriented to person, place, and time.  Vitals reviewed.   Lab Results  Component Value Date   WBC 7.9 09/06/2017   HGB 15.4 09/06/2017   HCT 44.6 09/06/2017   PLT 249.0 09/06/2017   GLUCOSE 113 (H) 09/06/2017   CHOL 198 09/06/2017   TRIG 79.0 09/06/2017   HDL 66.80 09/06/2017   LDLCALC 116 (H) 09/06/2017   ALT 18 09/06/2017   AST 24 09/06/2017   NA 138 09/06/2017   K 4.3 09/06/2017   CL 102 09/06/2017   CREATININE 1.04 09/06/2017   BUN 16 09/06/2017   CO2 29 09/06/2017   TSH 1.79 09/06/2017   PSA 1.01 12/18/2015   INR 1.10 12/29/2015    HGBA1C 5.5 09/06/2017    Ct Abdomen Pelvis W Contrast  Result Date: 08/03/2016 CLINICAL DATA:  Intermittent left upper quadrant pain for months. Negative ultrasound. EXAM: CT ABDOMEN AND PELVIS WITH CONTRAST TECHNIQUE: Multidetector CT imaging of the abdomen and pelvis was performed using the standard protocol following bolus administration of intravenous contrast. CONTRAST:  166mL ISOVUE-300 IOPAMIDOL (ISOVUE-300) INJECTION 61% COMPARISON:  None. FINDINGS: Lower chest and abdominal wall: Mild fatty enlargement of the right inguinal canal. Hepatobiliary: No focal liver abnormality.No evidence of biliary obstruction or stone. Pancreas: Unremarkable. Spleen: Unremarkable. Adrenals/Urinary Tract: Negative adrenals. No hydronephrosis or stone. Unremarkable bladder. Stomach/Bowel: Distal predominant colonic diverticulosis. No active inflammation. No obstruction or focal small bowel finding. Reproductive:Negative. Vascular/Lymphatic: Aortic atherosclerosis pre No acute vascular abnormality. No mass or adenopathy. Other: No ascites or pneumoperitoneum. Musculoskeletal: No acute or aggressive finding. Negative ribs at the left base. Lumbar degenerative disc disease greatest at L4-5 and L5-S1. IMPRESSION: 1. No acute finding or explanation for left upper quadrant pain. 2. Colonic diverticulosis. 3.  Aortic Atherosclerosis (ICD10-170.0) Electronically Signed   By: Monte Fantasia M.D.   On: 08/03/2016 09:55    Assessment & Plan:   Ryan Cobb was seen today for cough.  Diagnoses and all orders for this visit:  Acute non-recurrent pansinusitis -     ipratropium (ATROVENT) 0.03 % nasal spray; Place 2 sprays into both nostrils 2 (two) times daily. Do not use for more than 5days. -     methylPREDNISolone acetate (DEPO-MEDROL) injection 40 mg; Inject 1 mL (40 mg total) into the muscle once. -     HYDROcodone-homatropine (HYCODAN) 5-1.5 MG/5ML syrup; Take 5 mLs by mouth every 12 (twelve) hours as needed for cough. -      levofloxacin (LEVAQUIN) 500 MG tablet; Take 1 tablet (500 mg total) by mouth daily. -     dextromethorphan-guaiFENesin (MUCINEX DM) 30-600 MG 12hr tablet; Take 1 tablet by mouth 2 (two) times daily as needed for cough.  Acute bronchitis, unspecified organism -     ipratropium (ATROVENT) 0.03 % nasal spray; Place 2 sprays into both nostrils 2 (two) times daily. Do not use for more than 5days. -     methylPREDNISolone acetate (DEPO-MEDROL) injection 40 mg; Inject 1 mL (40 mg total) into the muscle once. -     HYDROcodone-homatropine (HYCODAN) 5-1.5 MG/5ML syrup; Take 5 mLs by mouth every 12 (twelve) hours as needed for cough. -     levofloxacin (LEVAQUIN) 500 MG tablet; Take 1 tablet (500 mg total) by mouth daily. -     dextromethorphan-guaiFENesin (MUCINEX DM) 30-600 MG 12hr tablet; Take 1 tablet by mouth 2 (two) times daily as needed for cough.  I am having Mr. Crilly start on ipratropium, HYDROcodone-homatropine, levofloxacin, and dextromethorphan-guaiFENesin. I am also having him maintain his fluticasone, Multiple Vitamins-Minerals (CENTRUM SILVER PO), calcium carbonate, Bioflavonoid Products (ESTER C PO), LECITHIN PO, b complex vitamins, Omega 3, Vitamin D3, Co Q 10, Garlic, Magnesium, Multiple Vitamins-Minerals (OCUVITE EYE + MULTI PO), cholecalciferol, Cetirizine HCl (ZYRTEC ALLERGY PO), montelukast, and lisinopril-hydrochlorothiazide. We administered methylPREDNISolone acetate.  Meds ordered this encounter  Medications  . ipratropium (ATROVENT) 0.03 % nasal spray    Sig: Place 2 sprays into both nostrils 2 (two) times daily. Do not use for more than 5days.    Dispense:  30 mL    Refill:  0    Order Specific Question:   Supervising Provider    Answer:   Cassandria Anger [[id number]]  . methylPREDNISolone acetate (DEPO-MEDROL) injection 40 mg  . HYDROcodone-homatropine (HYCODAN) 5-1.5 MG/5ML syrup    Sig: Take 5 mLs by mouth every 12 (twelve) hours as needed for cough.    Dispense:   120 mL    Refill:  0    Order Specific Question:   Supervising Provider    Answer:   Cassandria Anger [[id number]]  . levofloxacin (LEVAQUIN) 500 MG tablet    Sig: Take 1 tablet (500 mg total) by mouth daily.    Dispense:  5 tablet    Refill:  0    Order Specific Question:   Supervising Provider    Answer:   Cassandria Anger [[id number]]  . dextromethorphan-guaiFENesin (MUCINEX DM) 30-600 MG 12hr tablet    Sig: Take 1 tablet by mouth 2 (two) times daily as needed for cough.    Dispense:  14 tablet    Refill:  0    Order Specific Question:   Supervising Provider    Answer:   Cassandria Anger [[id number]]    Follow-up: Return if symptoms worsen or fail to improve.  Wilfred Lacy, NP

## 2017-10-21 ENCOUNTER — Other Ambulatory Visit: Payer: Self-pay | Admitting: Internal Medicine

## 2017-11-12 IMAGING — CT CT CERVICAL SPINE W/O CM
3 of 6 series · 13 of 33 positions shown, 16 images · non-contrast
Comparison: Cervical MRI 10/20/2015.  Radiographs 04/27/2009.

CLINICAL DATA: Neck pain radiating into the left shoulder. History
of cervical fusion 5 or 6 years ago. No reported acute injury.

EXAM:
CT CERVICAL SPINE WITHOUT CONTRAST
TECHNIQUE: Multidetector CT imaging of the cervical spine was performed without
intravenous contrast. Multiplanar CT image reconstructions were also
generated.

[Series 200: cor · coronal · 0.41mm/px · 1 of 65 slices shown]
[im 33/65  bone]
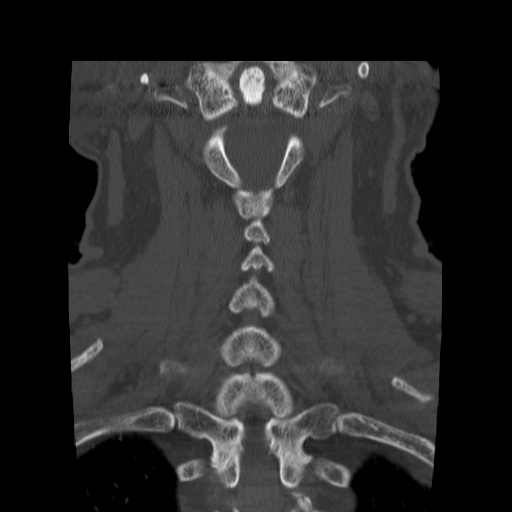

[Series 202: sag · sagittal · 0.41mm/px · 5 of 65 slices shown, 6 images]
[im 22/65  bone]
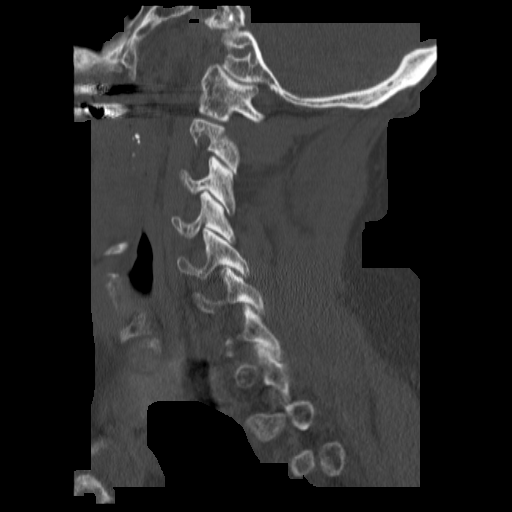
[im 27/65  bone]
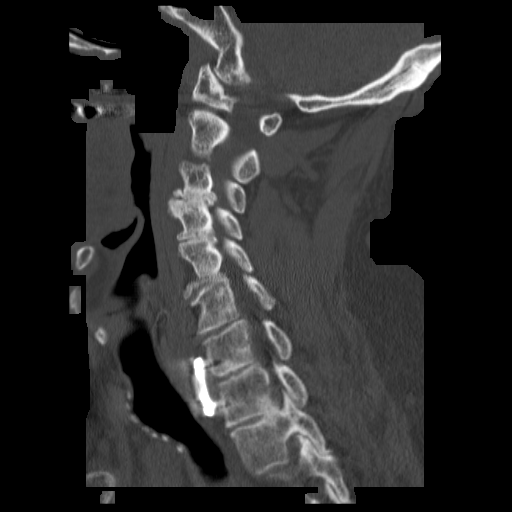
[im 33/65  soft-tissue]
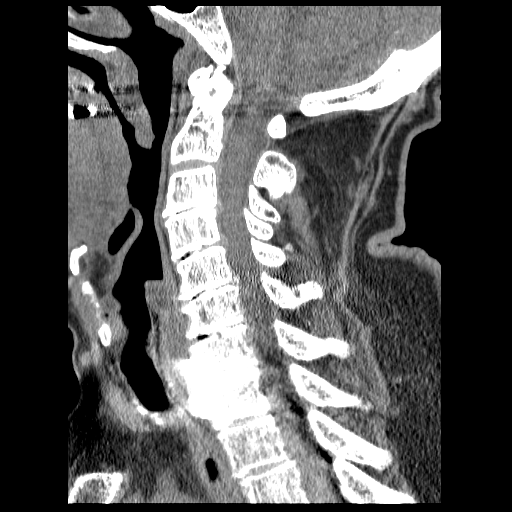
[im 33/65  bone]
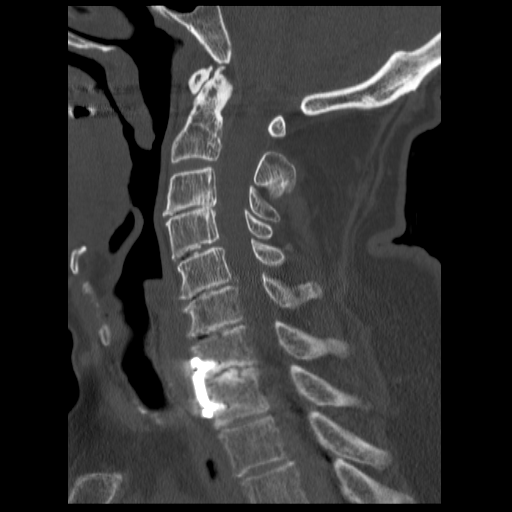
[im 38/65  bone]
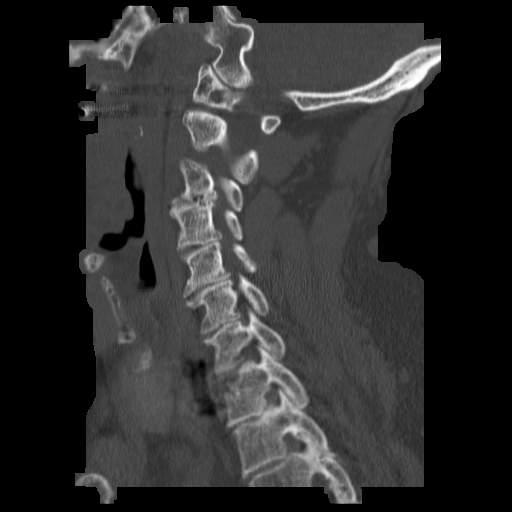
[im 43/65  bone]
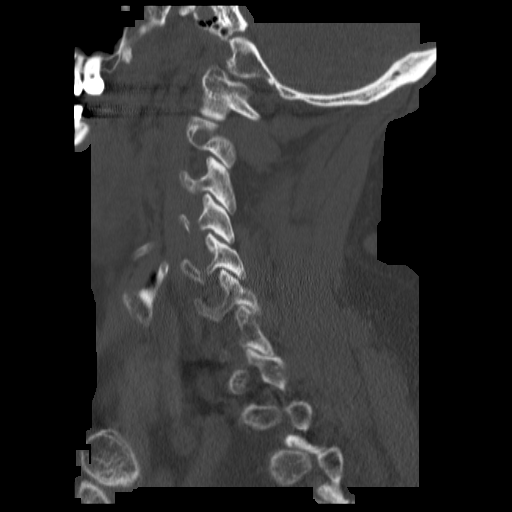

[Series 203: axial · axial · 0.20mm/px · z∈[-45,+114]mm · 7 of 309 slices shown, 9 images]
[im 39/309  soft-tissue]
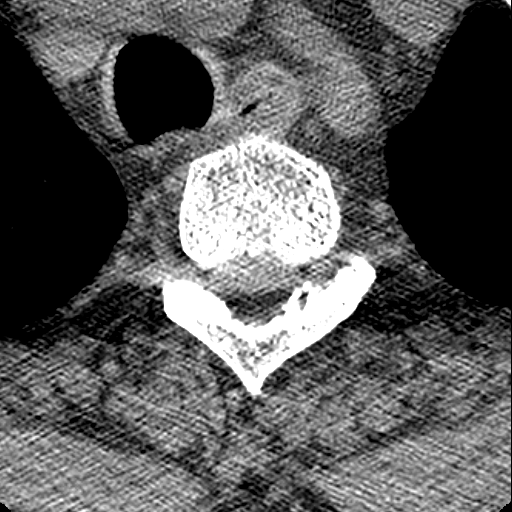
[im 39/309  bone]
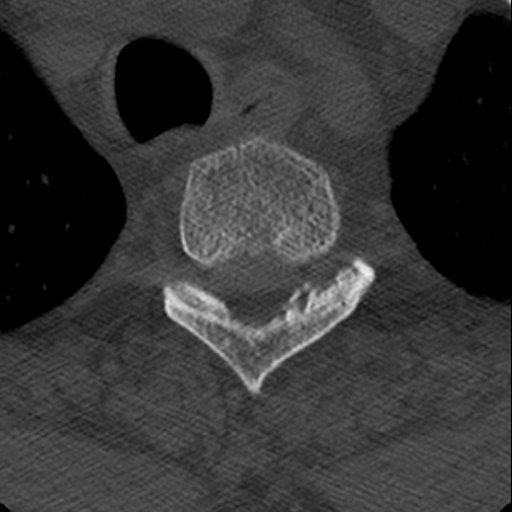
[im 78/309  bone]
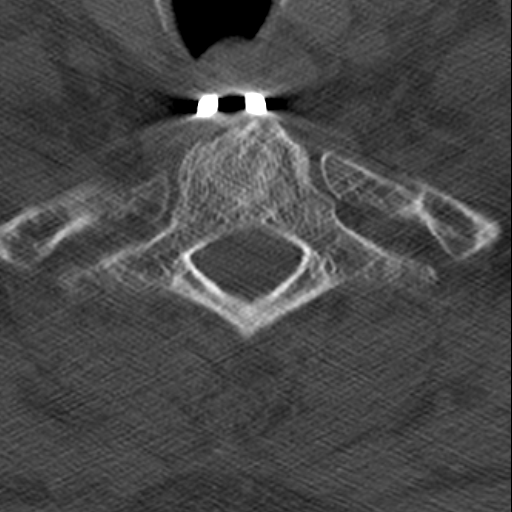
[im 116/309  bone]
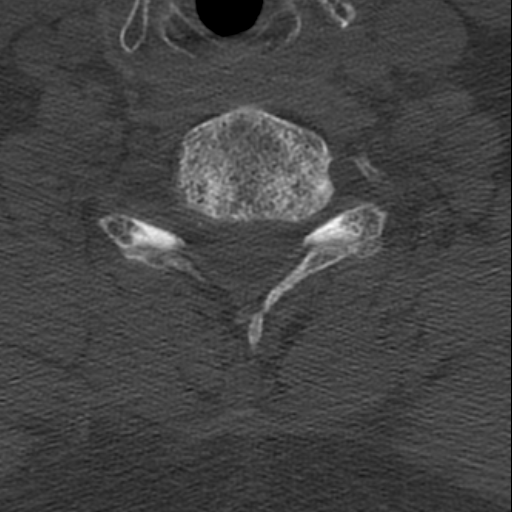
[im 155/309  bone]
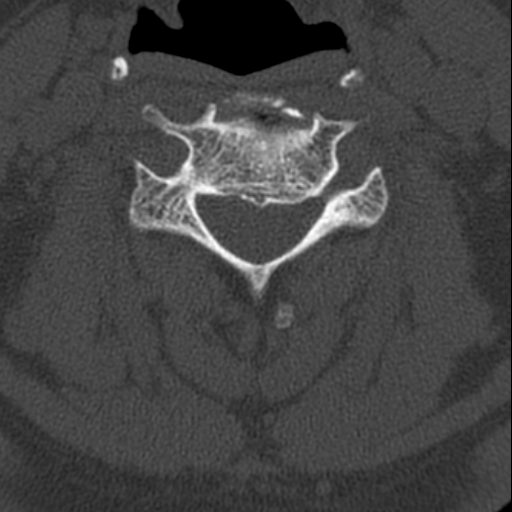
[im 193/309  soft-tissue]
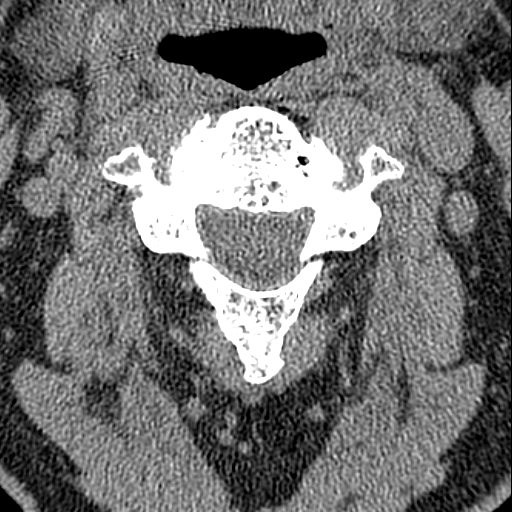
[im 193/309  bone]
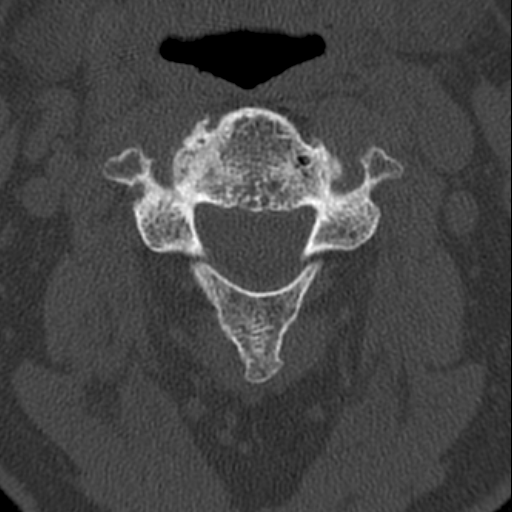
[im 232/309  bone]
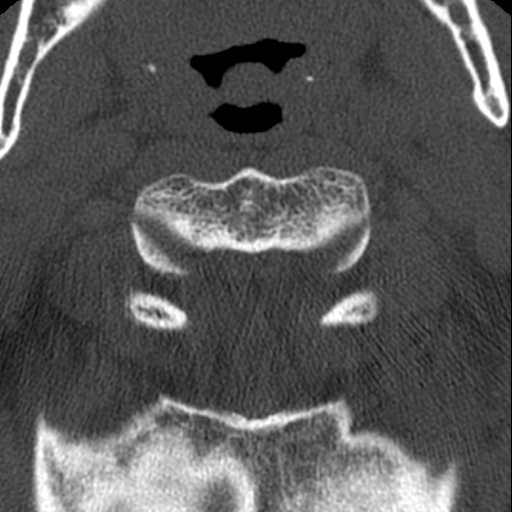
[im 270/309  bone]
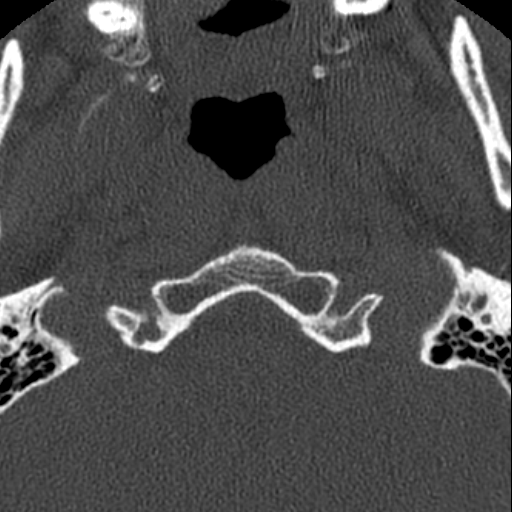

[13 of 33 positions shown; findings below may reference images not displayed]

FINDINGS: The alignment is stable with mild straightening. There is no focal
angulation or significant listhesis. No evidence of acute fracture.

Patient is status post anterior discectomy and fusion at C7-T1. The
anterior plate is intact without screw loosening. However, no solid
interbody fusion demonstrated. There is multilevel spondylosis with
loss of disc height and uncinate spurring. There is possible partial
auto fusion across the C3-4 disc space.

No paraspinal abnormalities are identified. There are probable
mucous retention cysts in both maxillary sinuses. No significant
soft tissue findings identified in the neck.

C2-3: Relatively preserved disc height with a stable shallow central
disc protrusion. No spinal stenosis.

C3-4: Advanced spondylosis with loss of disc height, posterior
osteophytes and bilateral uncinate spurring. Stable severe right and
moderate left foraminal narrowing.

C4-5: Spondylosis with chronic loss of disc height and bilateral
uncinate spurring. Stable moderate to severe foraminal narrowing
bilaterally by osteophytes.

C5-6: Spondylosis with chronic loss of disc height and asymmetric
uncinate spurring on the left. Stable severe left foraminal
narrowing.

C6-7: Stable spondylosis with chronic loss of disc height and
asymmetric uncinate spurring on the left. Stable moderate left
foraminal narrowing.

C7-T1: Status post ACDF with nonunion. No spinal stenosis or nerve
root encroachment.
IMPRESSION: 1. No acute findings or clear explanation for the patient's
symptoms.
2. Nonunion status post anterior discectomy and fusion at C7-T1. No
significant associated spinal stenosis.
3. Multilevel spondylosis with disc space loss, posterior
osteophytes and uncinate spurring as described. There is multilevel
moderate to severe foraminal narrowing which is generally worse on
the left, especially at C4-5 and C5-6.

## 2018-04-19 NOTE — Progress Notes (Signed)
Subjective:    Patient ID: Ryan Cobb, male    DOB: [birth year], 67 y.o.   MRN: [id number]  HPI The patient is here for an acute visit.  LUQ pain:  It started at least two years ago and he was seen here in the past for this. The soreness is sporadic.  He notices it when he drives for long distances.  three weeks ago he noticed a lump in the LUQ.  He was concerned it may be a hernia.  He denies surgery in that area.   The lump was new - that was the first time he saw that.  It has resolved - the lump is no longer present. He can go months without feeling anything.    He denies any fever, chills, abdominal bloating, changes in stool or blood in the stool.    He has never been vaccinated against measles and has never had measles.  He wondered about the measles vaccine.    Medications and allergies reviewed with patient and updated if appropriate.  Patient Active Problem List   Diagnosis Date Noted  . Hyperglycemia 09/06/2017  . Slipped rib syndrome 11/02/2016  . Nonallopathic lesion of thoracic region 11/02/2016  . Nonallopathic lesion of rib cage 11/02/2016  . Nonallopathic lesion of lumbosacral region 11/02/2016  . Erectile dysfunction 09/30/2016  . Right-sided chest pain 09/30/2016  . LUQ pain 07/06/2016  . Pulmonary edema   . Difficult intubation   . Respiratory failure ([place])   . Radiculopathy 01/06/2016  . Cervical disc disorder with radiculopathy of cervical region 12/18/2015  . Nonspecific abnormal electrocardiogram (ECG) (EKG) 01/01/2013  . Hyperlipidemia 03/04/2010  . Essential hypertension 03/04/2010  . ALLERGIC RHINITIS 03/04/2010  . History of colonic polyps 03/04/2010    Current Outpatient Medications on File Prior to Visit  Medication Sig Dispense Refill  . b complex vitamins tablet Take 1 tablet by mouth daily.    Marland Kitchen Bioflavonoid Products (ESTER C PO) Take 500 mg by mouth daily.    . calcium carbonate (OS-CAL) 600 MG TABS tablet Take 600 mg by mouth  daily.    . Cetirizine HCl (ZYRTEC ALLERGY PO) Take by mouth.    . cholecalciferol (VITAMIN D) 1000 units tablet Take 5,000 Units by mouth 4 (four) times a week.    . Coenzyme Q10 (CO Q 10) 10 MG CAPS Take 10 mg by mouth daily.     Marland Kitchen dextromethorphan-guaiFENesin (MUCINEX DM) 30-600 MG 12hr tablet Take 1 tablet by mouth 2 (two) times daily as needed for cough. 14 tablet 0  . fluticasone (FLONASE) 50 MCG/ACT nasal spray Place 1 spray into the nose 2 (two) times daily. (Patient taking differently: Place 1 spray into the nose daily as needed for allergies or rhinitis. ) 16 g 11  . Garlic 4098 MG CAPS Take 1,000 mg by mouth daily.     Marland Kitchen HYDROcodone-homatropine (HYCODAN) 5-1.5 MG/5ML syrup Take 5 mLs by mouth every 12 (twelve) hours as needed for cough. 120 mL 0  . ipratropium (ATROVENT) 0.03 % nasal spray Place 2 sprays into both nostrils 2 (two) times daily. Do not use for more than 5days. 30 mL 0  . LECITHIN PO Take 1,200 mg by mouth daily.    Marland Kitchen lisinopril-hydrochlorothiazide (PRINZIDE,ZESTORETIC) 20-25 MG tablet TAKE 1 TABLET EACH DAY. 90 tablet 2  . Magnesium 250 MG TABS Take 250 mg by mouth daily.     . montelukast (SINGULAIR) 10 MG tablet TAKE ONE TABLET AT BEDTIME. 90 tablet  2  . Multiple Vitamins-Minerals (CENTRUM SILVER PO) Take 1 tablet by mouth daily.     . Omega 3 1200 MG CAPS Take 1,200 mg by mouth daily.      No current facility-administered medications on file prior to visit.     Past Medical History:  Diagnosis Date  . Arthritis   . Basal cell adenoma    history of  . Cervical disc disorder   . Colon polyps 2006   hyperplastic  . Difficult intubation   . GERD (gastroesophageal reflux disease)   . Hyperlipidemia   . Hypertension   . Seasonal rhinitis    Flonase as needed; Singulair daily    Past Surgical History:  Procedure Laterality Date  . abdominal cyst     removed in 1994  . ANTERIOR CERVICAL DECOMPRESSION/DISCECTOMY FUSION 4 LEVELS N/A 01/06/2016   Procedure:  ANTERIOR CERVICAL DECOMPRESSION/DISCECTOMY FUSION 4 LEVELS;  Surgeon: Phylliss Bob, MD;  Location: [place];  Service: Orthopedics;  Laterality: N/A;  Anterior cerivcal decompression fusion, cervical 3-4, cervical 4-5, cervical 5-6, cervical 6-7 with instrumentation and allograft  . APPENDECTOMY  1995  . CERVICAL FUSION  01/06/2016   POST  LEVEL 5  . CERVICAL LAMINECTOMY  2010   Dr. Carloyn Manner  . COLONOSCOPY W/ POLYPECTOMY  2006   negative 2011  . INGUINAL HERNIA REPAIR  2003   left  . KNEE ARTHROSCOPY  2007   Dr Berenice Primas  . [place]   Left Knee Surgery   . labrum tear & bone spur surgery  02/2013   Dr Berenice Primas  . LIPOMA EXCISION     Right Waist  . POSTERIOR CERVICAL FUSION/FORAMINOTOMY N/A 01/07/2016   Procedure: POSTERIOR CERVICAL FUSION LEVEL 5;  Surgeon: Phylliss Bob, MD;  Location: [place];  Service: Orthopedics;  Laterality: N/A;  Posterior spinal fusion cervical 3-4, cervical 4-5, cervical 5-6, cervical 6-7, cervical 7-thoracic 1 with instrumentation and allograft  . REPLACEMENT TOTAL KNEE  2009   Dr Berenice Primas    Social History   Socioeconomic History  . Marital status: Married    Spouse name: Not on file  . Number of children: Not on file  . Years of education: Not on file  . Highest education level: Not on file  Occupational History  . Not on file  Social Needs  . Financial resource strain: Not on file  . Food insecurity:    Worry: Not on file    Inability: Not on file  . Transportation needs:    Medical: Not on file    Non-medical: Not on file  Tobacco Use  . Smoking status: Former Smoker    Types: Cigarettes    Last attempt to quit: 12/20/1979    Years since quitting: 38.3  . Smokeless tobacco: Never Used  . Tobacco comment: smoked 1967-1981, up to 1 ppd  Substance and Sexual Activity  . Alcohol use: Yes    Alcohol/week: 1.8 - 2.4 oz    Types: 3 - 4 Standard drinks or equivalent per week    Comment: occasional beer   . Drug use: No  . Sexual activity: Yes     Partners: Female  Lifestyle  . Physical activity:    Days per week: Not on file    Minutes per session: Not on file  . Stress: Not on file  Relationships  . Social connections:    Talks on phone: Not on file    Gets together: Not on file    Attends religious service:  Not on file    Active member of club or organization: Not on file    Attends meetings of clubs or organizations: Not on file    Relationship status: Not on file  Other Topics Concern  . Not on file  Social History Narrative  . Not on file    Family History  Problem Relation Age of Onset  . Alcohol abuse Father   . Diabetes Father   . Alcohol abuse Mother   . Cirrhosis Mother   . Breast cancer Sister   . Lung cancer Paternal Grandfather   . Hyperlipidemia Paternal Grandmother   . Hyperlipidemia Maternal Grandfather   . Stroke Maternal Grandfather 2  . Heart attack Neg Hx     Review of Systems  Constitutional: Negative for chills and fever.  Gastrointestinal: Positive for abdominal pain (intermittent). Negative for abdominal distention, blood in stool, constipation, diarrhea and nausea.       GERD occ  Genitourinary: Negative for dysuria and hematuria.       Objective:   Vitals:   04/20/18 1356  BP: (!) 154/78  Pulse: 69  Resp: 16  Temp: 98.1 F (36.7 C)  SpO2: 97%   BP Readings from Last 3 Encounters:  04/20/18 (!) 154/78  09/27/17 120/76  09/06/17 130/72   Wt Readings from Last 3 Encounters:  04/20/18 175 lb (79.4 kg)  09/27/17 176 lb (79.8 kg)  09/06/17 172 lb (78 kg)   Body mass index is 27.41 kg/m.   Physical Exam  Constitutional: He appears well-developed and well-nourished. No distress.  HENT:  Head: Normocephalic and atraumatic.  Abdominal: Soft. He exhibits no distension and no mass. There is no tenderness. There is no rebound and no guarding. A hernia (ventral hernia -non tender, no hernia in LUQ) is present.  Musculoskeletal:  No left sided rib tenderness or deformity    Skin: He is not diaphoretic.           Assessment & Plan:    See Problem List for Assessment and Plan of chronic medical problems.

## 2018-04-20 ENCOUNTER — Ambulatory Visit (INDEPENDENT_AMBULATORY_CARE_PROVIDER_SITE_OTHER): Payer: Medicare HMO | Admitting: Internal Medicine

## 2018-04-20 ENCOUNTER — Encounter: Payer: Self-pay | Admitting: Internal Medicine

## 2018-04-20 VITALS — BP 154/78 | HR 69 | Temp 98.1°F | Resp 16 | Wt 175.0 lb

## 2018-04-20 DIAGNOSIS — Z789 Other specified health status: Secondary | ICD-10-CM | POA: Diagnosis not present

## 2018-04-20 DIAGNOSIS — Z23 Encounter for immunization: Secondary | ICD-10-CM | POA: Diagnosis not present

## 2018-04-20 DIAGNOSIS — R1012 Left upper quadrant pain: Secondary | ICD-10-CM

## 2018-04-20 NOTE — Patient Instructions (Signed)
You received a measles vaccine today.  Make an appointment for the second measles vaccine in one month.    Monitor your left sided abdominal pain - if you would like a second opinion please let me know.

## 2018-04-21 NOTE — Assessment & Plan Note (Signed)
Never vaccinated and never had the disease Will vaccine # 1 today and #2 one month later

## 2018-04-21 NOTE — Assessment & Plan Note (Signed)
Left upper quadrant pain likely musculoskeletal in nature.  No evidence of a hernia and advised him that this is not typically where hernias occur Soreness typically occurs after sitting for long periods of time-advised to avoid this if possible or try stretching out to see if it helps Had imaging just under 2 years ago that was normal No further evaluation needed

## 2018-05-21 ENCOUNTER — Ambulatory Visit (INDEPENDENT_AMBULATORY_CARE_PROVIDER_SITE_OTHER): Payer: Medicare HMO

## 2018-05-21 DIAGNOSIS — Z23 Encounter for immunization: Secondary | ICD-10-CM | POA: Diagnosis not present

## 2018-05-21 NOTE — Progress Notes (Signed)
Injection given.   Janiylah Hannis J Carmen Vallecillo, MD  

## 2018-06-28 ENCOUNTER — Encounter (HOSPITAL_COMMUNITY): Payer: Self-pay | Admitting: *Deleted

## 2018-06-28 ENCOUNTER — Emergency Department (HOSPITAL_COMMUNITY)
Admission: EM | Admit: 2018-06-28 | Discharge: 2018-06-28 | Disposition: A | Discharge status: Home / Self Care | Payer: Medicare HMO | Attending: Emergency Medicine | Admitting: Emergency Medicine

## 2018-06-28 ENCOUNTER — Other Ambulatory Visit: Payer: Self-pay

## 2018-06-28 DIAGNOSIS — Z87891 Personal history of nicotine dependence: Secondary | ICD-10-CM | POA: Diagnosis not present

## 2018-06-28 DIAGNOSIS — Y999 Unspecified external cause status: Secondary | ICD-10-CM | POA: Diagnosis not present

## 2018-06-28 DIAGNOSIS — Z23 Encounter for immunization: Secondary | ICD-10-CM | POA: Insufficient documentation

## 2018-06-28 DIAGNOSIS — Z79899 Other long term (current) drug therapy: Secondary | ICD-10-CM | POA: Insufficient documentation

## 2018-06-28 DIAGNOSIS — Y939 Activity, unspecified: Secondary | ICD-10-CM | POA: Insufficient documentation

## 2018-06-28 DIAGNOSIS — I10 Essential (primary) hypertension: Secondary | ICD-10-CM | POA: Insufficient documentation

## 2018-06-28 DIAGNOSIS — S81812A Laceration without foreign body, left lower leg, initial encounter: Secondary | ICD-10-CM

## 2018-06-28 DIAGNOSIS — W25XXXA Contact with sharp glass, initial encounter: Secondary | ICD-10-CM | POA: Insufficient documentation

## 2018-06-28 DIAGNOSIS — Y929 Unspecified place or not applicable: Secondary | ICD-10-CM | POA: Diagnosis not present

## 2018-06-28 MED ORDER — ACETAMINOPHEN 500 MG PO TABS: 500.0000 mg | ORAL_TABLET | Freq: Four times a day (QID) | ORAL | 0 refills | Status: DC | PRN

## 2018-06-28 MED ORDER — TETANUS-DIPHTH-ACELL PERTUSSIS 5-2.5-18.5 LF-MCG/0.5 IM SUSP
0.5000 mL | Freq: Once | INTRAMUSCULAR | Status: AC
  Administered 2018-06-28: 0.5 mL via INTRAMUSCULAR
  Filled 2018-06-28: qty 0.5

## 2018-06-28 MED ORDER — LIDOCAINE-EPINEPHRINE (PF) 2 %-1:200000 IJ SOLN
10.0000 mL | Freq: Once | INTRAMUSCULAR | Status: AC
  Administered 2018-06-28: 10 mL via INTRADERMAL
  Filled 2018-06-28: qty 20

## 2018-06-28 NOTE — ED Triage Notes (Signed)
PT reports he cut his leg on broken glass that was in a trash bag he was holding.

## 2018-06-28 NOTE — ED Notes (Signed)
Declined W/C at D/C and was escorted to lobby by RN. 

## 2018-06-28 NOTE — ED Provider Notes (Signed)
[facility] EMERGENCY DEPARTMENT Provider Note   CSN: [id number] Arrival date & time: 06/28/18  1231     History   Chief Complaint Chief Complaint  Patient presents with  . Leg Injury    HPI Ryan Cobb is a 67 y.o. male.  The history is provided by the patient. No language interpreter was used.     67 year old male presenting for evaluation of left leg laceration.  Patient report earlier today, he was carrying a trash back to the trash dump when he suffered a laceration to his left mid leg from an exposed piece of glass.  He endorsed mild soreness to the affected area, nonradiating without any associated numbness.  He is not up-to-date with tetanus.  He denies any specific treatment tried.  He is not on any blood thinner medication.  Past Medical History:  Diagnosis Date  . Arthritis   . Basal cell adenoma    history of  . Cervical disc disorder   . Colon polyps 2006   hyperplastic  . Difficult intubation   . GERD (gastroesophageal reflux disease)   . Hyperlipidemia   . Hypertension   . Seasonal rhinitis    Flonase as needed; Singulair daily    Patient Active Problem List   Diagnosis Date Noted  . Measles, mumps, rubella (MMR) vaccination status unknown 04/20/2018  . Hyperglycemia 09/06/2017  . Slipped rib syndrome 11/02/2016  . Nonallopathic lesion of thoracic region 11/02/2016  . Nonallopathic lesion of rib cage 11/02/2016  . Nonallopathic lesion of lumbosacral region 11/02/2016  . Erectile dysfunction 09/30/2016  . Right-sided chest pain 09/30/2016  . LUQ pain 07/06/2016  . Pulmonary edema   . Difficult intubation   . Respiratory failure ([place])   . Radiculopathy 01/06/2016  . Cervical disc disorder with radiculopathy of cervical region 12/18/2015  . Nonspecific abnormal electrocardiogram (ECG) (EKG) 01/01/2013  . Hyperlipidemia 03/04/2010  . Essential hypertension 03/04/2010  . ALLERGIC RHINITIS 03/04/2010  . History of colonic  polyps 03/04/2010    Past Surgical History:  Procedure Laterality Date  . abdominal cyst     removed in 1994  . ANTERIOR CERVICAL DECOMPRESSION/DISCECTOMY FUSION 4 LEVELS N/A 01/06/2016   Procedure: ANTERIOR CERVICAL DECOMPRESSION/DISCECTOMY FUSION 4 LEVELS;  Surgeon: Phylliss Bob, MD;  Location: [facility];  Service: Orthopedics;  Laterality: N/A;  Anterior cerivcal decompression fusion, cervical 3-4, cervical 4-5, cervical 5-6, cervical 6-7 with instrumentation and allograft  . APPENDECTOMY  1995  . CERVICAL FUSION  01/06/2016   POST  LEVEL 5  . CERVICAL LAMINECTOMY  2010   Dr. Carloyn Manner  . COLONOSCOPY W/ POLYPECTOMY  2006   negative 2011  . INGUINAL HERNIA REPAIR  2003   left  . KNEE ARTHROSCOPY  2007   Dr Berenice Primas  . [place]   Left Knee Surgery   . labrum tear & bone spur surgery  02/2013   Dr Berenice Primas  . LIPOMA EXCISION     Right Waist  . POSTERIOR CERVICAL FUSION/FORAMINOTOMY N/A 01/07/2016   Procedure: POSTERIOR CERVICAL FUSION LEVEL 5;  Surgeon: Phylliss Bob, MD;  Location: [facility];  Service: Orthopedics;  Laterality: N/A;  Posterior spinal fusion cervical 3-4, cervical 4-5, cervical 5-6, cervical 6-7, cervical 7-thoracic 1 with instrumentation and allograft  . REPLACEMENT TOTAL KNEE  2009   Dr Berenice Primas        Home Medications    Prior to Admission medications   Medication Sig Start Date End Date Taking? Authorizing Provider  b complex  vitamins tablet Take 1 tablet by mouth daily.    [provider]  Bioflavonoid Products (ESTER C PO) Take 500 mg by mouth daily.    [provider]  calcium carbonate (OS-CAL) 600 MG TABS tablet Take 600 mg by mouth daily.    [provider]  Cetirizine HCl (ZYRTEC ALLERGY PO) Take by mouth.    [provider]  cholecalciferol (VITAMIN D) 1000 units tablet Take 5,000 Units by mouth 4 (four) times a week.    [provider]  Coenzyme Q10 (CO Q 10) 10 MG CAPS Take 10 mg by mouth daily.     [provider]  dextromethorphan-guaiFENesin (MUCINEX DM) 30-600 MG 12hr tablet Take 1 tablet by mouth 2 (two) times daily as needed for cough. 09/27/17   Nche, Charlene Brooke, NP  fluticasone (FLONASE) 50 MCG/ACT nasal spray Place 1 spray into the nose 2 (two) times daily. Patient taking differently: Place 1 spray into the nose daily as needed for allergies or rhinitis.  09/26/12   Hendricks Limes, MD  Garlic 9211 MG CAPS Take 1,000 mg by mouth daily.     [provider]  ipratropium (ATROVENT) 0.03 % nasal spray Place 2 sprays into both nostrils 2 (two) times daily. Do not use for more than 5days. 09/27/17   Nche, Charlene Brooke, NP  LECITHIN PO Take 1,200 mg by mouth daily.    [provider]  lisinopril-hydrochlorothiazide (PRINZIDE,ZESTORETIC) 20-25 MG tablet TAKE 1 TABLET EACH DAY. 10/23/17   Binnie Rail, MD  Magnesium 250 MG TABS Take 250 mg by mouth daily.     [provider]  montelukast (SINGULAIR) 10 MG tablet TAKE ONE TABLET AT BEDTIME. 10/23/17   Burns, Claudina Lick, MD  Multiple Vitamins-Minerals (CENTRUM SILVER PO) Take 1 tablet by mouth daily.     [provider]  Omega 3 1200 MG CAPS Take 1,200 mg by mouth daily.     [provider]    Family History Family History  Problem Relation Age of Onset  . Alcohol abuse Father   . Diabetes Father   . Alcohol abuse Mother   . Cirrhosis Mother   . Breast cancer Sister   . Lung cancer Paternal Grandfather   . Hyperlipidemia Paternal Grandmother   . Hyperlipidemia Maternal Grandfather   . Stroke Maternal Grandfather 6  . Heart attack Neg Hx     Social History Social History   Tobacco Use  . Smoking status: Former Smoker    Types: Cigarettes    Last attempt to quit: 12/20/1979    Years since quitting: 38.5  . Smokeless tobacco: Never Used  . Tobacco comment: smoked 1967-1981, up to 1 ppd  Substance Use Topics  . Alcohol use: Yes    Alcohol/week: 1.8 - 2.4 oz    Types: 3 - 4 Standard  drinks or equivalent per week    Comment: occasional beer   . Drug use: No     Allergies   Patient has no known allergies.   Review of Systems Review of Systems  Constitutional: Negative for fever.  Skin: Positive for wound.  Neurological: Negative for numbness.     Physical Exam Updated Vital Signs BP (!) 143/81   Pulse 78   Temp 98.4 F (36.9 C) (Oral)   Resp 16   Ht '5\' 7"'$  (1.702 m)   Wt 74.8 kg (165 lb)   SpO2 96%   BMI 25.84 kg/m   Physical Exam  Constitutional: He  appears well-developed and well-nourished. No distress.  HENT:  Head: Atraumatic.  Eyes: Conjunctivae are normal.  Neck: Neck supple.  Musculoskeletal: He exhibits tenderness (Left leg: A 3 cm horizontal superficial laceration noted to the mid lateral lower leg not actively bleeding without any foreign body noted.  Sensation is intact.).  Neurological: He is alert.  Skin: No rash noted.  Psychiatric: He has a normal mood and affect.  Nursing note and vitals reviewed.    ED Treatments / Results  Labs (all labs ordered are listed, but only abnormal results are displayed) Labs Reviewed - No data to display  EKG None  Radiology No results found.  Procedures .Marland KitchenLaceration Repair Date/Time: 06/28/2018 1:59 PM Performed by: Domenic Moras, PA-C Authorized by: Domenic Moras, PA-C   Consent:    Consent obtained:  Verbal   Consent given by:  Patient   Risks discussed:  Infection, need for additional repair, poor wound healing and retained foreign body   Alternatives discussed:  No treatment, observation and delayed treatment Anesthesia (see MAR for exact dosages):    Anesthesia method:  Local infiltration   Local anesthetic:  Lidocaine 2% WITH epi Laceration details:    Location:  Leg   Leg location:  L lower leg   Length (cm):  3   Depth (mm):  3 Repair type:    Repair type:  Intermediate Pre-procedure details:    Preparation:  Patient was prepped and draped in usual sterile  fashion Exploration:    Hemostasis achieved with:  Epinephrine and direct pressure   Wound exploration: wound explored through full range of motion and entire depth of wound probed and visualized     Wound extent: vascular damage     Wound extent: no foreign bodies/material noted, no muscle damage noted and no nerve damage noted     Contaminated: no   Treatment:    Area cleansed with:  Betadine   Amount of cleaning:  Standard   Irrigation solution:  Sterile saline   Irrigation volume:  200   Irrigation method:  Pressure wash   Visualized foreign bodies/material removed: no   Skin repair:    Repair method:  Sutures   Suture size:  5-0   Suture material:  Prolene   Suture technique:  Simple interrupted   Number of sutures:  5 Approximation:    Approximation:  Close Post-procedure details:    Dressing:  Non-adherent dressing   Patient tolerance of procedure:  Tolerated well, no immediate complications   (including critical care time)  Medications Ordered in ED Medications  lidocaine-EPINEPHrine (XYLOCAINE W/EPI) 2 %-1:200000 (PF) injection 10 mL (10 mLs Intradermal Given 06/28/18 1341)  Tdap (BOOSTRIX) injection 0.5 mL (0.5 mLs Intramuscular Given 06/28/18 1341)     Initial Impression / Assessment and Plan / ED Course  I have reviewed the triage vital signs and the nursing notes.  Pertinent labs & imaging results that were available during my care of the patient were reviewed by me and considered in my medical decision making (see chart for details).     BP (!) 143/81   Pulse 78   Temp 98.4 F (36.9 C) (Oral)   Resp 16   Ht _0  (1.702 m)   Wt 74.8 kg (165 lb)   SpO2 96%   BMI 25.84 kg/m    Final Clinical Impressions(s) / ED Diagnoses   Final diagnoses:  Laceration of left lower extremity, initial encounter    ED Discharge Orders  Ordered    acetaminophen (TYLENOL) 500 MG tablet  Every 6 hours PRN     06/28/18 1402     1:22 PM Patient here with a  laceration to his left lower extremity that would benefit from wound cleansing and suture repair.  Will update tetanus.  2:03 PM Successful lac repair.  Sutures to be remove in 7 days.  Wound care instruction given.    Domenic Moras, PA-C 06/28/18 1404    Valarie Merino, MD 06/29/18 [phone]

## 2018-06-28 NOTE — Discharge Instructions (Signed)
Please follow up with your doctor in 5-7 days for sutures removal. Return if you notice any signs of infection or if you have any concerns.

## 2018-07-05 ENCOUNTER — Ambulatory Visit (INDEPENDENT_AMBULATORY_CARE_PROVIDER_SITE_OTHER): Payer: Medicare HMO | Admitting: Family

## 2018-07-05 ENCOUNTER — Encounter: Payer: Self-pay | Admitting: Family

## 2018-07-05 VITALS — BP 132/82 | HR 89 | Temp 98.1°F | Ht 67.0 in | Wt 168.0 lb

## 2018-07-05 DIAGNOSIS — Z4802 Encounter for removal of sutures: Secondary | ICD-10-CM | POA: Diagnosis not present

## 2018-07-05 NOTE — Progress Notes (Signed)
Ryan Cobb is a 67 y.o. male with the following history as recorded in [employer]:  Patient Active Problem List   Diagnosis Date Noted  . Measles, mumps, rubella (MMR) vaccination status unknown 04/20/2018  . Hyperglycemia 09/06/2017  . Slipped rib syndrome 11/02/2016  . Nonallopathic lesion of thoracic region 11/02/2016  . Nonallopathic lesion of rib cage 11/02/2016  . Nonallopathic lesion of lumbosacral region 11/02/2016  . Erectile dysfunction 09/30/2016  . Right-sided chest pain 09/30/2016  . LUQ pain 07/06/2016  . Pulmonary edema   . Difficult intubation   . Respiratory failure ([place])   . Radiculopathy 01/06/2016  . Cervical disc disorder with radiculopathy of cervical region 12/18/2015  . Nonspecific abnormal electrocardiogram (ECG) (EKG) 01/01/2013  . Hyperlipidemia 03/04/2010  . Essential hypertension 03/04/2010  . ALLERGIC RHINITIS 03/04/2010  . History of colonic polyps 03/04/2010    Current Outpatient Medications  Medication Sig Dispense Refill  . b complex vitamins tablet Take 1 tablet by mouth daily.    Marland Kitchen Bioflavonoid Products (ESTER C PO) Take 500 mg by mouth daily.    . calcium carbonate (OS-CAL) 600 MG TABS tablet Take 600 mg by mouth daily.    . Cetirizine HCl (ZYRTEC ALLERGY PO) Take by mouth.    . cholecalciferol (VITAMIN D) 1000 units tablet Take 5,000 Units by mouth 4 (four) times a week.    . Coenzyme Q10 (CO Q 10) 10 MG CAPS Take 10 mg by mouth daily.     Marland Kitchen dextromethorphan-guaiFENesin (MUCINEX DM) 30-600 MG 12hr tablet Take 1 tablet by mouth 2 (two) times daily as needed for cough. 14 tablet 0  . fluticasone (FLONASE) 50 MCG/ACT nasal spray Place 1 spray into the nose 2 (two) times daily. (Patient taking differently: Place 1 spray into the nose daily as needed for allergies or rhinitis. ) 16 g 11  . Garlic 9371 MG CAPS Take 1,000 mg by mouth daily.     Marland Kitchen LECITHIN PO Take 1,200 mg by mouth daily.    Marland Kitchen lisinopril-hydrochlorothiazide (PRINZIDE,ZESTORETIC)  20-25 MG tablet TAKE 1 TABLET EACH DAY. 90 tablet 2  . Magnesium 250 MG TABS Take 250 mg by mouth daily.     . montelukast (SINGULAIR) 10 MG tablet TAKE ONE TABLET AT BEDTIME. 90 tablet 2  . Multiple Vitamins-Minerals (CENTRUM SILVER PO) Take 1 tablet by mouth daily.     . Omega 3 1200 MG CAPS Take 1,200 mg by mouth daily.      No current facility-administered medications for this visit.     Allergies: Patient has no known allergies.  Past Medical History:  Diagnosis Date  . Arthritis   . Basal cell adenoma    history of  . Cervical disc disorder   . Colon polyps 2006   hyperplastic  . Difficult intubation   . GERD (gastroesophageal reflux disease)   . Hyperlipidemia   . Hypertension   . Seasonal rhinitis    Flonase as needed; Singulair daily    Past Surgical History:  Procedure Laterality Date  . abdominal cyst     removed in 1994  . ANTERIOR CERVICAL DECOMPRESSION/DISCECTOMY FUSION 4 LEVELS N/A 01/06/2016   Procedure: ANTERIOR CERVICAL DECOMPRESSION/DISCECTOMY FUSION 4 LEVELS;  Surgeon: Phylliss Bob, MD;  Location: [facility];  Service: Orthopedics;  Laterality: N/A;  Anterior cerivcal decompression fusion, cervical 3-4, cervical 4-5, cervical 5-6, cervical 6-7 with instrumentation and allograft  . APPENDECTOMY  1995  . CERVICAL FUSION  01/06/2016   POST  LEVEL 5  . CERVICAL  LAMINECTOMY  2010   Dr. Carloyn Manner  . COLONOSCOPY W/ POLYPECTOMY  2006   negative 2011  . INGUINAL HERNIA REPAIR  2003   left  . KNEE ARTHROSCOPY  2007   Dr Berenice Primas  . [place]   Left Knee Surgery   . labrum tear & bone spur surgery  02/2013   Dr Berenice Primas  . LIPOMA EXCISION     Right Waist  . POSTERIOR CERVICAL FUSION/FORAMINOTOMY N/A 01/07/2016   Procedure: POSTERIOR CERVICAL FUSION LEVEL 5;  Surgeon: Phylliss Bob, MD;  Location: [facility];  Service: Orthopedics;  Laterality: N/A;  Posterior spinal fusion cervical 3-4, cervical 4-5, cervical 5-6, cervical 6-7, cervical 7-thoracic 1 with instrumentation  and allograft  . REPLACEMENT TOTAL KNEE  2009   Dr Berenice Primas    Family History  Problem Relation Age of Onset  . Alcohol abuse Father   . Diabetes Father   . Alcohol abuse Mother   . Cirrhosis Mother   . Breast cancer Sister   . Lung cancer Paternal Grandfather   . Hyperlipidemia Paternal Grandmother   . Hyperlipidemia Maternal Grandfather   . Stroke Maternal Grandfather 64  . Heart attack Neg Hx     Social History   Tobacco Use  . Smoking status: Former Smoker    Types: Cigarettes    Last attempt to quit: 12/20/1979    Years since quitting: 38.5  . Smokeless tobacco: Never Used  . Tobacco comment: smoked 1967-1981, up to 1 ppd  Substance Use Topics  . Alcohol use: Yes    Alcohol/week: 1.8 - 2.4 oz    Types: 3 - 4 Standard drinks or equivalent per week    Comment: occasional beer     Subjective:  Patient seen at ER on 7/11 with laceration on left lower calf; had 5 stitches placed and needs removed today; no concerns about wound healing;    Objective:  Vitals:   07/05/18 1501  BP: 132/82  Pulse: 89  Temp: 98.1 F (36.7 C)  TempSrc: Oral  SpO2: 97%  Weight: 168 lb 0.6 oz (76.2 kg)  Height: '5\' 7"'  (1.702 m)    General: Well developed, well nourished, in no acute distress  Skin : Warm and dry. 5 stitches removed from left calf with no complication; wound well healed with no concerns for surrounding infection Head: Normocephalic and atraumatic  Lungs: Respirations unlabored; Musculoskeletal: No deformities; no active joint inflammation  Extremities: No edema, cyanosis, clubbing  Vessels: Symmetric bilaterally  Neurologic: Alert and oriented; speech intact; face symmetrical; moves all extremities well; CNII-XII intact without focal deficit  Assessment:  1. Visit for suture removal     Plan:  5 sutures removed with no complications; wound covered with Triple antibiotic and Band-aid; follow-up as needed otherwise.   No follow-ups on file.  No orders of the defined types  were placed in this encounter.   Requested Prescriptions    No prescriptions requested or ordered in this encounter

## 2018-07-09 ENCOUNTER — Other Ambulatory Visit: Payer: Self-pay | Admitting: Internal Medicine

## 2018-08-01 NOTE — Patient Instructions (Addendum)
  Test(s) ordered today. Your results will be released to [facility] (or called to you) after review, usually within 72hours after test completion. If any changes need to be made, you will be notified at that same time.   Medications reviewed and updated.  No changes recommended at this time.  Your prescription(s) have been submitted to your pharmacy. Please take as directed and contact our office if you believe you are having problem(s) with the medication(s).  A referral was ordered for dermatology.   Please followup in one year for a physical

## 2018-08-01 NOTE — Progress Notes (Signed)
Subjective:    Patient ID: Ryan Cobb, male    DOB: 18-Nov-1951, 67 y.o.   MRN: [id number]  HPI The patient is here for follow up.  Hypertension: He is taking his medication daily. He is compliant with a low sodium diet.  He denies chest pain, palpitations, edema, shortness of breath and regular headaches. He is exercising regularly - walks 35 miles a week.  He does not monitor his blood pressure at home.    Hyperlipidemia: He is taking his medication daily. He is compliant with a low fat/cholesterol diet. He is exercising regularly. He denies myalgias.   hypeglycemia:  He is compliant with a low sugar/carbohydrate diet.  He is exercising regularly.   Allegies:  He has seasonal allergies and is taking singular and his meds daily.  He feels his allergies are well controlled.    Medications and allergies reviewed with patient and updated if appropriate.  Patient Active Problem List   Diagnosis Date Noted  . Hyperglycemia 09/06/2017  . Slipped rib syndrome 11/02/2016  . Nonallopathic lesion of thoracic region 11/02/2016  . Nonallopathic lesion of rib cage 11/02/2016  . Nonallopathic lesion of lumbosacral region 11/02/2016  . Erectile dysfunction 09/30/2016  . Right-sided chest pain 09/30/2016  . LUQ pain 07/06/2016  . Difficult intubation   . Cervical disc disorder with radiculopathy of cervical region 12/18/2015  . Nonspecific abnormal electrocardiogram (ECG) (EKG) 01/01/2013  . Hyperlipidemia 03/04/2010  . Essential hypertension 03/04/2010  . Allergic rhinitis 03/04/2010  . History of colonic polyps 03/04/2010    Current Outpatient Medications on File Prior to Visit  Medication Sig Dispense Refill  . b complex vitamins tablet Take 1 tablet by mouth daily.    Marland Kitchen Bioflavonoid Products (ESTER C PO) Take 500 mg by mouth daily.    . calcium carbonate (OS-CAL) 600 MG TABS tablet Take 600 mg by mouth daily.    . Cetirizine HCl (ZYRTEC ALLERGY PO) Take by mouth.    .  cholecalciferol (VITAMIN D) 1000 units tablet Take 5,000 Units by mouth 4 (four) times a week.    . Coenzyme Q10 (CO Q 10) 10 MG CAPS Take 10 mg by mouth daily.     Marland Kitchen dextromethorphan-guaiFENesin (MUCINEX DM) 30-600 MG 12hr tablet Take 1 tablet by mouth 2 (two) times daily as needed for cough. 14 tablet 0  . fluticasone (FLONASE) 50 MCG/ACT nasal spray Place 1 spray into the nose 2 (two) times daily. (Patient taking differently: Place 1 spray into the nose daily as needed for allergies or rhinitis. ) 16 g 11  . Garlic 3532 MG CAPS Take 1,000 mg by mouth daily.     Marland Kitchen LECITHIN PO Take 1,200 mg by mouth daily.    Marland Kitchen lisinopril-hydrochlorothiazide (PRINZIDE,ZESTORETIC) 20-25 MG tablet Take 1 tablet by mouth daily. Annual appt due in Sept must see provider for future refills 90 tablet 0  . Magnesium 250 MG TABS Take 250 mg by mouth daily.     . montelukast (SINGULAIR) 10 MG tablet Take 1 tablet (10 mg total) by mouth at bedtime. Annual appt due in Sept must see provider for future refills 90 tablet 0  . Multiple Vitamins-Minerals (CENTRUM SILVER PO) Take 1 tablet by mouth daily.     . Omega 3 1200 MG CAPS Take 1,200 mg by mouth daily.      No current facility-administered medications on file prior to visit.     Past Medical History:  Diagnosis Date  . Arthritis   .  Basal cell adenoma    history of  . Cervical disc disorder   . Colon polyps 2006   hyperplastic  . Difficult intubation   . GERD (gastroesophageal reflux disease)   . Hyperlipidemia   . Hypertension   . Seasonal rhinitis    Flonase as needed; Singulair daily    Past Surgical History:  Procedure Laterality Date  . abdominal cyst     removed in 1994  . ANTERIOR CERVICAL DECOMPRESSION/DISCECTOMY FUSION 4 LEVELS N/A 01/06/2016   Procedure: ANTERIOR CERVICAL DECOMPRESSION/DISCECTOMY FUSION 4 LEVELS;  Surgeon: Phylliss Bob, MD;  Location: [facility];  Service: Orthopedics;  Laterality: N/A;  Anterior cerivcal decompression fusion,  cervical 3-4, cervical 4-5, cervical 5-6, cervical 6-7 with instrumentation and allograft  . APPENDECTOMY  1995  . CERVICAL FUSION  01/06/2016   POST  LEVEL 5  . CERVICAL LAMINECTOMY  2010   Dr. Carloyn Manner  . COLONOSCOPY W/ POLYPECTOMY  2006   negative 2011  . INGUINAL HERNIA REPAIR  2003   left  . KNEE ARTHROSCOPY  2007   Dr Berenice Primas  . [place]   Left Knee Surgery   . labrum tear & bone spur surgery  02/2013   Dr Berenice Primas  . LIPOMA EXCISION     Right Waist  . POSTERIOR CERVICAL FUSION/FORAMINOTOMY N/A 01/07/2016   Procedure: POSTERIOR CERVICAL FUSION LEVEL 5;  Surgeon: Phylliss Bob, MD;  Location: [facility];  Service: Orthopedics;  Laterality: N/A;  Posterior spinal fusion cervical 3-4, cervical 4-5, cervical 5-6, cervical 6-7, cervical 7-thoracic 1 with instrumentation and allograft  . REPLACEMENT TOTAL KNEE  2009   Dr Berenice Primas    Social History   Socioeconomic History  . Marital status: Married    Spouse name: Not on file  . Number of children: Not on file  . Years of education: Not on file  . Highest education level: Not on file  Occupational History  . Not on file  Social Needs  . Financial resource strain: Not on file  . Food insecurity:    Worry: Not on file    Inability: Not on file  . Transportation needs:    Medical: Not on file    Non-medical: Not on file  Tobacco Use  . Smoking status: Former Smoker    Types: Cigarettes    Last attempt to quit: 12/20/1979    Years since quitting: 38.6  . Smokeless tobacco: Never Used  . Tobacco comment: smoked 1967-1981, up to 1 ppd  Substance and Sexual Activity  . Alcohol use: Yes    Alcohol/week: 3.0 - 4.0 standard drinks    Types: 3 - 4 Standard drinks or equivalent per week    Comment: occasional beer   . Drug use: No  . Sexual activity: Yes    Partners: Female  Lifestyle  . Physical activity:    Days per week: Not on file    Minutes per session: Not on file  . Stress: Not on file  Relationships  . Social  connections:    Talks on phone: Not on file    Gets together: Not on file    Attends religious service: Not on file    Active member of club or organization: Not on file    Attends meetings of clubs or organizations: Not on file    Relationship status: Not on file  Other Topics Concern  . Not on file  Social History Narrative  . Not on file    Family History  Problem Relation Age of Onset  . Alcohol abuse Father   . Diabetes Father   . Alcohol abuse Mother   . Cirrhosis Mother   . Breast cancer Sister   . Lung cancer Paternal Grandfather   . Hyperlipidemia Paternal Grandmother   . Hyperlipidemia Maternal Grandfather   . Stroke Maternal Grandfather 35  . Heart attack Neg Hx     Review of Systems  Constitutional: Negative for appetite change, chills, fatigue and fever.  Respiratory: Negative for cough, shortness of breath and wheezing.   Cardiovascular: Negative for chest pain, palpitations and leg swelling.  Neurological: Negative for light-headedness and headaches.  Psychiatric/Behavioral: Negative for dysphoric mood and sleep disturbance. The patient is not nervous/anxious.        Objective:   Vitals:   08/02/18 0952  BP: 124/78  Pulse: (!) 56  Resp: 16  Temp: (!) 97.5 F (36.4 C)  SpO2: 98%   BP Readings from Last 3 Encounters:  08/02/18 124/78  07/05/18 132/82  06/28/18 128/69   Wt Readings from Last 3 Encounters:  08/02/18 165 lb (74.8 kg)  07/05/18 168 lb 0.6 oz (76.2 kg)  06/28/18 165 lb (74.8 kg)   Body mass index is 25.84 kg/m.   Physical Exam    Constitutional: Appears well-developed and well-nourished. No distress.  HENT:  Head: Normocephalic and atraumatic.  Neck: Neck supple. No tracheal deviation present. No thyromegaly present.  No cervical lymphadenopathy Cardiovascular: Normal rate, regular rhythm and normal heart sounds.   No murmur heard. No carotid bruit .  No edema Pulmonary/Chest: Effort normal and breath sounds normal. No  respiratory distress. No has no wheezes. No rales.  Skin: Skin is warm and dry. Not diaphoretic. Dry patches on top of head Psychiatric: Normal mood and affect. Behavior is normal.      Assessment & Plan:    See Problem List for Assessment and Plan of chronic medical problems.

## 2018-08-02 ENCOUNTER — Ambulatory Visit (INDEPENDENT_AMBULATORY_CARE_PROVIDER_SITE_OTHER): Payer: Medicare HMO | Admitting: Internal Medicine

## 2018-08-02 ENCOUNTER — Other Ambulatory Visit (INDEPENDENT_AMBULATORY_CARE_PROVIDER_SITE_OTHER): Payer: Medicare HMO

## 2018-08-02 ENCOUNTER — Encounter: Payer: Self-pay | Admitting: Internal Medicine

## 2018-08-02 VITALS — BP 124/78 | HR 56 | Temp 97.5°F | Resp 16 | Wt 165.0 lb

## 2018-08-02 DIAGNOSIS — L989 Disorder of the skin and subcutaneous tissue, unspecified: Secondary | ICD-10-CM | POA: Diagnosis not present

## 2018-08-02 DIAGNOSIS — E785 Hyperlipidemia, unspecified: Secondary | ICD-10-CM

## 2018-08-02 DIAGNOSIS — R739 Hyperglycemia, unspecified: Secondary | ICD-10-CM | POA: Diagnosis not present

## 2018-08-02 DIAGNOSIS — I10 Essential (primary) hypertension: Secondary | ICD-10-CM

## 2018-08-02 LAB — CBC WITH DIFFERENTIAL/PLATELET
BASOS ABS: 0.1 10*3/uL (ref 0.0–0.1)
BASOS PCT: 1.2 % (ref 0.0–3.0)
EOS PCT: 1.9 % (ref 0.0–5.0)
Eosinophils Absolute: 0.1 10*3/uL (ref 0.0–0.7)
HEMATOCRIT: 41.2 % (ref 39.0–52.0)
Hemoglobin: 14.2 g/dL (ref 13.0–17.0)
LYMPHS ABS: 1.7 10*3/uL (ref 0.7–4.0)
LYMPHS PCT: 30.4 % (ref 12.0–46.0)
MCHC: 34.5 g/dL (ref 30.0–36.0)
MCV: 91.5 fl (ref 78.0–100.0)
MONOS PCT: 7.8 % (ref 3.0–12.0)
Monocytes Absolute: 0.4 10*3/uL (ref 0.1–1.0)
NEUTROS ABS: 3.3 10*3/uL (ref 1.4–7.7)
Neutrophils Relative %: 58.7 % (ref 43.0–77.0)
Platelets: 227 10*3/uL (ref 150.0–400.0)
RBC: 4.5 Mil/uL (ref 4.22–5.81)
RDW: 13.9 % (ref 11.5–15.5)
WBC: 5.6 10*3/uL (ref 4.0–10.5)

## 2018-08-02 LAB — COMPREHENSIVE METABOLIC PANEL
ALT: 21 U/L (ref 0–53)
AST: 29 U/L (ref 0–37)
Albumin: 4.2 g/dL (ref 3.5–5.2)
Alkaline Phosphatase: 55 U/L (ref 39–117)
BILIRUBIN TOTAL: 0.7 mg/dL (ref 0.2–1.2)
BUN: 16 mg/dL (ref 6–23)
CALCIUM: 9.9 mg/dL (ref 8.4–10.5)
CHLORIDE: 104 meq/L (ref 96–112)
CO2: 29 mEq/L (ref 19–32)
Creatinine, Ser: 1.04 mg/dL (ref 0.40–1.50)
GFR: 75.35 mL/min (ref >60.00)
GLUCOSE: 105 mg/dL — AB (ref 70–99)
POTASSIUM: 3.8 meq/L (ref 3.5–5.1)
Sodium: 140 mEq/L (ref 135–145)
Total Protein: 7.2 g/dL (ref 6.0–8.3)

## 2018-08-02 LAB — LIPID PANEL
CHOL/HDL RATIO: 2
Cholesterol: 159 mg/dL (ref 0–200)
HDL: 65.4 mg/dL (ref >39.00)
LDL CALC: 83 mg/dL (ref 0–99)
NONHDL: 93.92
TRIGLYCERIDES: 54 mg/dL (ref 0.0–149.0)
VLDL: 10.8 mg/dL (ref 0.0–40.0)

## 2018-08-02 LAB — TSH: TSH: 1.82 u[IU]/mL (ref 0.35–4.50)

## 2018-08-02 LAB — HEMOGLOBIN A1C: HEMOGLOBIN A1C: 5.5 % (ref 4.6–6.5)

## 2018-08-02 NOTE — Assessment & Plan Note (Signed)
Check lipid panel,cmp ,tsh Continue daily statin Regular exercise and healthy diet encouraged  

## 2018-08-02 NOTE — Assessment & Plan Note (Signed)
a1c

## 2018-08-02 NOTE — Assessment & Plan Note (Addendum)
BP well controlled Current regimen effective and well tolerated Continue current medications at current doses Cmp, tsh, cbc 

## 2018-08-02 NOTE — Assessment & Plan Note (Signed)
Dry patches on head - [place] Refer to derm

## 2018-08-24 DIAGNOSIS — M25561 Pain in right knee: Secondary | ICD-10-CM | POA: Diagnosis not present

## 2018-08-27 DIAGNOSIS — Z1283 Encounter for screening for malignant neoplasm of skin: Secondary | ICD-10-CM | POA: Diagnosis not present

## 2018-08-27 DIAGNOSIS — X32XXXD Exposure to sunlight, subsequent encounter: Secondary | ICD-10-CM | POA: Diagnosis not present

## 2018-08-27 DIAGNOSIS — L570 Actinic keratosis: Secondary | ICD-10-CM | POA: Diagnosis not present

## 2018-08-27 DIAGNOSIS — D225 Melanocytic nevi of trunk: Secondary | ICD-10-CM | POA: Diagnosis not present

## 2018-08-30 DIAGNOSIS — M25561 Pain in right knee: Secondary | ICD-10-CM | POA: Diagnosis not present

## 2018-09-03 DIAGNOSIS — M25461 Effusion, right knee: Secondary | ICD-10-CM | POA: Diagnosis not present

## 2018-09-03 DIAGNOSIS — M25561 Pain in right knee: Secondary | ICD-10-CM | POA: Diagnosis not present

## 2018-09-19 DIAGNOSIS — H524 Presbyopia: Secondary | ICD-10-CM | POA: Diagnosis not present

## 2018-09-20 DIAGNOSIS — Z0100 Encounter for examination of eyes and vision without abnormal findings: Secondary | ICD-10-CM | POA: Diagnosis not present

## 2018-10-01 ENCOUNTER — Other Ambulatory Visit: Payer: Self-pay | Admitting: Internal Medicine

## 2018-10-30 ENCOUNTER — Encounter: Payer: Self-pay | Admitting: Internal Medicine

## 2018-10-30 NOTE — Telephone Encounter (Signed)
Documented flu shot.../LMB  

## 2018-11-06 NOTE — Progress Notes (Addendum)
Subjective:   Ryan Cobb is a 67 y.o. male who presents for Medicare Annual/Subsequent preventive examination.  Review of Systems:  No ROS.  Medicare Wellness Visit. Additional risk factors are reflected in the social history.  Cardiac Risk Factors include: advanced age (>52mn, >>96women);dyslipidemia;male gender;hypertension Sleep patterns: gets up 1-2 times nightly to void and sleeps 6-7 hours nightly.    Home Safety/Smoke Alarms: Feels safe in home. Smoke alarms in place.  Living environment; residence and Firearm Safety: 1-story house/ trailer, no firearms. Lives with wife, no needs for DME, good support system Seat Belt Safety/Bike Helmet: Wears seat belt.      Objective:    Vitals: BP 124/72   Pulse (!) 53   Resp 17   Ht '5\' 7"'  (1.702 m)   Wt 164 lb (74.4 kg)   SpO2 98%   BMI 25.69 kg/m   Body mass index is 25.69 kg/m.  Advanced Directives 11/07/2018 09/06/2017 01/06/2016 12/29/2015  Does Patient Have a Medical Advance Directive? Yes Yes Yes Yes  Type of [place] Living will;Healthcare Power of Attorney -  Does patient want to make changes to medical advance directive? - - No - Patient declined -  Copy of [place]? No - copy requested No - copy requested No - copy requested No - copy requested    Tobacco Social History   Tobacco Use  Smoking Status Former Smoker  . Types: Cigarettes  . Last attempt to quit: 12/20/1979  . Years since quitting: 38.9  Smokeless Tobacco Never Used  Tobacco Comment   smoked [id number] up to 1 ppd     Counseling given: Not Answered Comment: smoked 1967-1981, up to 1 ppd  Past Medical History:  Diagnosis Date  . Arthritis   . Basal cell adenoma    history of  . Cervical disc disorder   . Colon polyps 2006   hyperplastic  . Difficult intubation   . GERD (gastroesophageal reflux disease)   .  Hyperlipidemia   . Hypertension   . Seasonal rhinitis    Flonase as needed; Singulair daily   Past Surgical History:  Procedure Laterality Date  . abdominal cyst     removed in 1994  . ANTERIOR CERVICAL DECOMPRESSION/DISCECTOMY FUSION 4 LEVELS N/A 01/06/2016   Procedure: ANTERIOR CERVICAL DECOMPRESSION/DISCECTOMY FUSION 4 LEVELS;  Surgeon: MPhylliss Bob MD;  [place] [facility]  Service: Orthopedics;  Laterality: N/A;  Anterior cerivcal decompression fusion, cervical 3-4, cervical 4-5, cervical 5-6, cervical 6-7 with instrumentation and allograft  . APPENDECTOMY  1995  . CERVICAL FUSION  01/06/2016   POST  LEVEL 5  . CERVICAL LAMINECTOMY  2010   Dr. RCarloyn Manner . COLONOSCOPY W/ POLYPECTOMY  2006   negative 2011  . INGUINAL HERNIA REPAIR  2003   left  . KNEE ARTHROSCOPY  2007   Dr GBerenice Primas . [facility]  Left Knee Surgery   . labrum tear & bone spur surgery  02/2013   Dr GBerenice Primas . LIPOMA EXCISION     Right Waist  . POSTERIOR CERVICAL FUSION/FORAMINOTOMY N/A 01/07/2016   Procedure: POSTERIOR CERVICAL FUSION LEVEL 5;  Surgeon: MPhylliss Bob MD;  [facility]  Service: Orthopedics;  Laterality: N/A;  Posterior spinal fusion cervical 3-4, cervical 4-5, cervical 5-6, cervical 6-7, cervical 7-thoracic 1 with instrumentation and allograft  . REPLACEMENT TOTAL KNEE  2009   Dr GBerenice Primas  Family History  Problem Relation Age of Onset  . Alcohol abuse Father   . Diabetes Father   . Alcohol abuse Mother   . Cirrhosis Mother   . Breast cancer Sister   . Lung cancer Paternal Grandfather   . Hyperlipidemia Paternal Grandmother   . Hyperlipidemia Maternal Grandfather   . Stroke Maternal Grandfather 52  . Heart attack Neg Hx    Social History   Socioeconomic History  . Marital status: Married    Spouse name: Not on file  . Number of children: Not on file  . Years of education: Not on file  . Highest education level: Not on file  Occupational History  . Occupation: owns company    Social Needs  . Financial resource strain: Not hard at all  . Food insecurity:    Worry: Never true    Inability: Never true  . Transportation needs:    Medical: Yes    Non-medical: Yes  Tobacco Use  . Smoking status: Former Smoker    Types: Cigarettes    Last attempt to quit: 12/20/1979    Years since quitting: 38.9  . Smokeless tobacco: Never Used  . Tobacco comment: smoked 1967-1981, up to 1 ppd  Substance and Sexual Activity  . Alcohol use: Yes    Alcohol/week: 3.0 - 4.0 standard drinks    Types: 3 - 4 Standard drinks or equivalent per week    Comment: occasional beer   . Drug use: No  . Sexual activity: Yes    Partners: Female  Lifestyle  . Physical activity:    Days per week: 6 days    Minutes per session: 60 min  . Stress: Only a little  Relationships  . Social connections:    Talks on phone: More than three times a week    Gets together: More than three times a week    Attends religious service: 1 to 4 times per year    Active member of club or organization: Yes    Attends meetings of clubs or organizations: More than 4 times per year    Relationship status: Married  Other Topics Concern  . Not on file  Social History Narrative  . Not on file    Outpatient Encounter Medications as of 11/07/2018  Medication Sig  . b complex vitamins tablet Take 1 tablet by mouth daily.  Marland Kitchen Bioflavonoid Products (ESTER C PO) Take 500 mg by mouth daily.  . calcium carbonate (OS-CAL) 600 MG TABS tablet Take 600 mg by mouth daily.  . Cetirizine HCl (ZYRTEC ALLERGY PO) Take by mouth.  . cholecalciferol (VITAMIN D) 1000 units tablet Take 5,000 Units by mouth 4 (four) times a week.  . Coenzyme Q10 (CO Q 10) 10 MG CAPS Take 10 mg by mouth daily.   Marland Kitchen dextromethorphan-guaiFENesin (MUCINEX DM) 30-600 MG 12hr tablet Take 1 tablet by mouth 2 (two) times daily as needed for cough.  . fluticasone (FLONASE) 50 MCG/ACT nasal spray Place 1 spray into the nose 2 (two) times daily. (Patient  taking differently: Place 1 spray into the nose daily as needed for allergies or rhinitis. )  . Garlic 2426 MG CAPS Take 1,000 mg by mouth daily.   Marland Kitchen LECITHIN PO Take 1,200 mg by mouth daily.  Marland Kitchen lisinopril-hydrochlorothiazide (PRINZIDE,ZESTORETIC) 20-25 MG tablet TAKE 1 TABLET EACH DAY.  . Magnesium 250 MG TABS Take 250 mg by mouth daily.   . montelukast (SINGULAIR) 10 MG tablet TAKE ONE TABLET AT BEDTIME.  . Multiple  Vitamins-Minerals (CENTRUM SILVER PO) Take 1 tablet by mouth daily.   . multivitamin-lutein (OCUVITE-LUTEIN) CAPS capsule Take 1 capsule by mouth daily.  . naproxen sodium (ALEVE) 220 MG tablet Take 220 mg by mouth as needed.  . Omega 3 1200 MG CAPS Take 1,200 mg by mouth daily.    No facility-administered encounter medications on file as of 11/07/2018.     Activities of Daily Living In your present state of health, do you have any difficulty performing the following activities: 11/07/2018  Hearing? Y  Vision? N  Difficulty concentrating or making decisions? N  Walking or climbing stairs? N  Dressing or bathing? N  Doing errands, shopping? N  Preparing Food and eating ? N  Using the Toilet? N  In the past six months, have you accidently leaked urine? N  Do you have problems with loss of bowel control? N  Managing your Medications? N  Managing your Finances? N  Housekeeping or managing your Housekeeping? N  Some recent data might be hidden    Patient Care Team: Binnie Rail, MD as PCP - General (Internal Medicine)   Assessment:   This is a routine wellness examination for [place]. Physical assessment deferred to PCP.   Exercise Activities and Dietary recommendations Current Exercise Habits: Home exercise routine;Structured exercise class, Type of exercise: walking(Bicycles), Time (Minutes): 50, Frequency (Times/Week): 6, Weekly Exercise (Minutes/Week): 300, Intensity: Mild, Exercise limited by: orthopedic condition(s)  Diet (meal preparation, eat out, water  intake, caffeinated beverages, dairy products, fruits and vegetables): in general, a "healthy" diet  , well balanced eats a variety of fruits and vegetables daily, limits salt, fat/cholesterol, sugar,carbohydrates,caffeine, drinks 6-8 glasses of water daily.  Goals    . Patient Stated     Continue to travel, work around the house, and perhaps volunteer in the community or do something that gives to others. Love and spend time with family.    . Stay as active and as independent as possible       Fall Risk Fall Risk  11/07/2018 09/06/2017 09/30/2016 01/23/2015  Falls in the past year? 0 No No No    Depression Screen PHQ 2/9 Scores 11/07/2018 09/06/2017 09/30/2016 01/23/2015  PHQ - 2 Score 0 2 0 0  PHQ- 9 Score 1 5 - -    Cognitive Function MMSE - Mini Mental State Exam 11/07/2018  Orientation to time 5  Orientation to Place 5  Registration 3  Attention/ Calculation 5  Recall 3  Language- name 2 objects 2  Language- repeat 1  Language- follow 3 step command 3  Language- read & follow direction 1  Write a sentence 1  Copy design 1  Total score 30        Immunization History  Administered Date(s) Administered  . Influenza Whole 09/18/2012  . Influenza, High Dose Seasonal PF 09/30/2016, 09/06/2017, 10/16/2018  . Influenza-Unspecified 10/12/2013, 09/18/2014, 09/20/2015  . MMR 04/20/2018, 05/21/2018  . Pneumococcal Conjugate-13 12/18/2015  . Pneumococcal Polysaccharide-23 09/06/2017  . Tdap 02/03/2012, 06/28/2018  . Zoster 10/18/2012   Screening Tests Health Maintenance  Topic Date Due  . COLONOSCOPY  05/25/2020  . TETANUS/TDAP  06/28/2028  . INFLUENZA VACCINE  Completed  . Hepatitis C Screening  Completed  . PNA vac Low Risk Adult  Completed       Plan:   Bring a copy of your living will and/or healthcare power of attorney to your next office visit.  Continue to eat heart healthy diet (full of fruits, vegetables, whole grains,  lean protein, water--limit salt, fat,  and sugar intake) and increase physical activity as tolerated.   I have personally reviewed and noted the following in the patient's chart:   . Medical and social history . Use of alcohol, tobacco or illicit drugs  . Current medications and supplements . Functional ability and status . Nutritional status . Physical activity . Advanced directives . List of other physicians . Vitals . Screenings to include cognitive, depression, and falls . Referrals and appointments  In addition, I have reviewed and discussed with patient certain preventive protocols, quality metrics, and best practice recommendations. A written personalized care plan for preventive services as well as general preventive health recommendations were provided to patient.     Michiel Cowboy, RN  11/07/2018    Medical screening examination/treatment/procedure(s) were performed by non-physician practitioner and as supervising physician I was immediately available for consultation/collaboration. I agree with above. Binnie Rail, MD

## 2018-11-07 ENCOUNTER — Ambulatory Visit: Payer: Medicare HMO | Admitting: *Deleted

## 2018-11-07 VITALS — BP 124/72 | HR 53 | Resp 17 | Ht 67.0 in | Wt 164.0 lb

## 2018-11-07 DIAGNOSIS — Z0000 Encounter for general adult medical examination without abnormal findings: Secondary | ICD-10-CM

## 2018-11-07 NOTE — Patient Instructions (Addendum)
Bring a copy of your living will and/or healthcare power of attorney to your next office visit.  Continue to eat heart healthy diet (full of fruits, vegetables, whole grains, lean protein, water--limit salt, fat, and sugar intake) and increase physical activity as tolerated.   Ryan Cobb , Thank you for taking time to come for your Medicare Wellness Visit. I appreciate your ongoing commitment to your health goals. Please review the following plan we discussed and let me know if I can assist you in the future.   These are the goals we discussed: Goals    . Patient Stated     Continue to travel, working around the house, and perhaps volunteer in the community or something that gives to others. Love and spend time with family.    . Stay as active and as independent as possible       This is a list of the screening recommended for you and due dates:  Health Maintenance  Topic Date Due  . Colon Cancer Screening  05/25/2020  . Tetanus Vaccine  06/28/2028  . Flu Shot  Completed  .  Hepatitis C: One time screening is recommended by Center for Disease Control  (CDC) for  adults born from [birth year] through 1966.   Completed  . Pneumonia vaccines  Completed      Health Maintenance, Male A healthy lifestyle and preventive care is important for your health and wellness. Ask your health care provider about what schedule of regular examinations is right for you. What should I know about weight and diet? Eat a Healthy Diet  Eat plenty of vegetables, fruits, whole grains, low-fat dairy products, and lean protein.  Do not eat a lot of foods high in solid fats, added sugars, or salt.  Maintain a Healthy Weight Regular exercise can help you achieve or maintain a healthy weight. You should:  Do at least 150 minutes of exercise each week. The exercise should increase your heart rate and make you sweat (moderate-intensity exercise).  Do strength-training exercises at least twice a week.  Watch Your  Levels of Cholesterol and Blood Lipids  Have your blood tested for lipids and cholesterol every 5 years starting at 68 years of age. If you are at high risk for heart disease, you should start having your blood tested when you are 68 years old. You may need to have your cholesterol levels checked more often if: ? Your lipid or cholesterol levels are high. ? You are older than 68 years of age. ? You are at high risk for heart disease.  What should I know about cancer screening? Many types of cancers can be detected early and may often be prevented. Lung Cancer  You should be screened every year for lung cancer if: ? You are a current smoker who has smoked for at least 30 years. ? You are a former smoker who has quit within the past 15 years.  Talk to your health care provider about your screening options, when you should start screening, and how often you should be screened.  Colorectal Cancer  Routine colorectal cancer screening usually begins at 67 years of age and should be repeated every 5-10 years until you are 68 years old. You may need to be screened more often if early forms of precancerous polyps or small growths are found. Your health care provider may recommend screening at an earlier age if you have risk factors for colon cancer.  Your health care provider may recommend using home  test kits to check for hidden blood in the stool.  A small camera at the end of a tube can be used to examine your colon (sigmoidoscopy or colonoscopy). This checks for the earliest forms of colorectal cancer.  Prostate and Testicular Cancer  Depending on your age and overall health, your health care provider may do certain tests to screen for prostate and testicular cancer.  Talk to your health care provider about any symptoms or concerns you have about testicular or prostate cancer.  Skin Cancer  Check your skin from head to toe regularly.  Tell your health care provider about any new moles  or changes in moles, especially if: ? There is a change in a mole's size, shape, or color. ? You have a mole that is larger than a pencil eraser.  Always use sunscreen. Apply sunscreen liberally and repeat throughout the day.  Protect yourself by wearing long sleeves, pants, a wide-brimmed hat, and sunglasses when outside.  What should I know about heart disease, diabetes, and high blood pressure?  If you are 74-61 years of age, have your blood pressure checked every 3-5 years. If you are 63 years of age or older, have your blood pressure checked every year. You should have your blood pressure measured twice-once when you are at a hospital or clinic, and once when you are not at a hospital or clinic. Record the average of the two measurements. To check your blood pressure when you are not at a hospital or clinic, you can use: ? An automated blood pressure machine at a pharmacy. ? A home blood pressure monitor.  Talk to your health care provider about your target blood pressure.  If you are between 1-78 years old, ask your health care provider if you should take aspirin to prevent heart disease.  Have regular diabetes screenings by checking your fasting blood sugar level. ? If you are at a normal weight and have a low risk for diabetes, have this test once every three years after the age of 38. ? If you are overweight and have a high risk for diabetes, consider being tested at a younger age or more often.  A one-time screening for abdominal aortic aneurysm (AAA) by ultrasound is recommended for men aged 43-75 years who are current or former smokers. What should I know about preventing infection? Hepatitis B If you have a higher risk for hepatitis B, you should be screened for this virus. Talk with your health care provider to find out if you are at risk for hepatitis B infection. Hepatitis C Blood testing is recommended for:  Everyone born from [birth year] through 1966.  Anyone with known  risk factors for hepatitis C.  Sexually Transmitted Diseases (STDs)  You should be screened each year for STDs including gonorrhea and chlamydia if: ? You are sexually active and are younger than 68 years of age. ? You are older than 68 years of age and your health care provider tells you that you are at risk for this type of infection. ? Your sexual activity has changed since you were last screened and you are at an increased risk for chlamydia or gonorrhea. Ask your health care provider if you are at risk.  Talk with your health care provider about whether you are at high risk of being infected with HIV. Your health care provider may recommend a prescription medicine to help prevent HIV infection.  What else can I do?  Schedule regular health, dental, and eye exams.  Stay current with your vaccines (immunizations).  Do not use any tobacco products, such as cigarettes, chewing tobacco, and e-cigarettes. If you need help quitting, ask your health care provider.  Limit alcohol intake to no more than 2 drinks per day. One drink equals 12 ounces of beer, 5 ounces of , or 1 ounces of hard liquor.  Do not use street drugs.  Do not share needles.  Ask your health care provider for help if you need support or information about quitting drugs.  Tell your health care provider if you often feel depressed.  Tell your health care provider if you have ever been abused or do not feel safe at home. This information is not intended to replace advice given to you by your health care provider. Make sure you discuss any questions you have with your health care provider. Document Released: 06/02/2008 Document Revised: 08/03/2016 Document Reviewed: 09/08/2015 Elsevier Interactive Patient Education  [employer].

## 2018-11-22 MED FILL — SHINGRIX 50 MCG SUS: 50 | 1 days supply | Qty: 1 | Fill #0

## 2018-12-20 ENCOUNTER — Encounter: Payer: Self-pay | Admitting: Internal Medicine

## 2018-12-20 ENCOUNTER — Ambulatory Visit (INDEPENDENT_AMBULATORY_CARE_PROVIDER_SITE_OTHER): Payer: Medicare HMO | Admitting: Internal Medicine

## 2018-12-20 VITALS — BP 136/80 | HR 66 | Temp 98.7°F | Resp 16 | Ht 67.0 in | Wt 169.0 lb

## 2018-12-20 DIAGNOSIS — J0100 Acute maxillary sinusitis, unspecified: Secondary | ICD-10-CM

## 2018-12-20 MED ORDER — AZITHROMYCIN 250 MG PO TABS: ORAL_TABLET | ORAL | 0 refills | Status: DC

## 2018-12-20 NOTE — Assessment & Plan Note (Signed)
Likely bacterial  Start zpak otc cold medications Rest, fluid Call if no improvement  

## 2018-12-20 NOTE — Progress Notes (Signed)
Subjective:    Patient ID: Ryan Cobb, male    DOB: 12-24-1950, 68 y.o.   MRN: [id number]  HPI He is here for an acute visit for cold symptoms.  His symptoms started about 7 days  He is experiencing nasal congestion, ear discomfort, postnasal drip, sore throat, watery eyes, cough that is productive at times, shortness of breath and headaches.  He denies any fever, wheezing and lightheadedness.  He has tried taking gargle with salt water, mucinex, neti pot   Medications and allergies reviewed with patient and updated if appropriate.  Patient Active Problem List   Diagnosis Date Noted  . Skin abnormalities 08/02/2018  . Hyperglycemia 09/06/2017  . Slipped rib syndrome 11/02/2016  . Nonallopathic lesion of thoracic region 11/02/2016  . Nonallopathic lesion of rib cage 11/02/2016  . Nonallopathic lesion of lumbosacral region 11/02/2016  . Erectile dysfunction 09/30/2016  . Right-sided chest pain 09/30/2016  . LUQ pain 07/06/2016  . Difficult intubation   . Cervical disc disorder with radiculopathy of cervical region 12/18/2015  . Nonspecific abnormal electrocardiogram (ECG) (EKG) 01/01/2013  . Hyperlipidemia 03/04/2010  . Essential hypertension 03/04/2010  . Allergic rhinitis 03/04/2010  . History of colonic polyps 03/04/2010    Current Outpatient Medications on File Prior to Visit  Medication Sig Dispense Refill  . b complex vitamins tablet Take 1 tablet by mouth daily.    Marland Kitchen Bioflavonoid Products (ESTER C PO) Take 500 mg by mouth daily.    . calcium carbonate (OS-CAL) 600 MG TABS tablet Take 600 mg by mouth daily.    . Cetirizine HCl (ZYRTEC ALLERGY PO) Take by mouth.    . cholecalciferol (VITAMIN D) 1000 units tablet Take 5,000 Units by mouth 4 (four) times a week.    . Coenzyme Q10 (CO Q 10) 10 MG CAPS Take 10 mg by mouth daily.     Marland Kitchen dextromethorphan-guaiFENesin (MUCINEX DM) 30-600 MG 12hr tablet Take 1 tablet by mouth 2 (two) times daily as needed for cough. 14  tablet 0  . fluticasone (FLONASE) 50 MCG/ACT nasal spray Place 1 spray into the nose 2 (two) times daily. (Patient taking differently: Place 1 spray into the nose daily as needed for allergies or rhinitis. ) 16 g 11  . Garlic 9622 MG CAPS Take 1,000 mg by mouth daily.     Marland Kitchen LECITHIN PO Take 1,200 mg by mouth daily.    Marland Kitchen lisinopril-hydrochlorothiazide (PRINZIDE,ZESTORETIC) 20-25 MG tablet TAKE 1 TABLET EACH DAY. 90 tablet 0  . Magnesium 250 MG TABS Take 250 mg by mouth daily.     . montelukast (SINGULAIR) 10 MG tablet TAKE ONE TABLET AT BEDTIME. 90 tablet 0  . Multiple Vitamins-Minerals (CENTRUM SILVER PO) Take 1 tablet by mouth daily.     . multivitamin-lutein (OCUVITE-LUTEIN) CAPS capsule Take 1 capsule by mouth daily.    . naproxen sodium (ALEVE) 220 MG tablet Take 220 mg by mouth as needed.    . Omega 3 1200 MG CAPS Take 1,200 mg by mouth daily.      No current facility-administered medications on file prior to visit.     Past Medical History:  Diagnosis Date  . Arthritis   . Basal cell adenoma    history of  . Cervical disc disorder   . Colon polyps 2006   hyperplastic  . Difficult intubation   . GERD (gastroesophageal reflux disease)   . Hyperlipidemia   . Hypertension   . Seasonal rhinitis    Flonase as needed;  Singulair daily    Past Surgical History:  Procedure Laterality Date  . abdominal cyst     removed in 1994  . ANTERIOR CERVICAL DECOMPRESSION/DISCECTOMY FUSION 4 LEVELS N/A 01/06/2016   Procedure: ANTERIOR CERVICAL DECOMPRESSION/DISCECTOMY FUSION 4 LEVELS;  Surgeon: Phylliss Bob, MD;  Location: [facility];  Service: Orthopedics;  Laterality: N/A;  Anterior cerivcal decompression fusion, cervical 3-4, cervical 4-5, cervical 5-6, cervical 6-7 with instrumentation and allograft  . APPENDECTOMY  1995  . CERVICAL FUSION  01/06/2016   POST  LEVEL 5  . CERVICAL LAMINECTOMY  2010   Dr. Carloyn Manner  . COLONOSCOPY W/ POLYPECTOMY  2006   negative 2011  . INGUINAL HERNIA REPAIR  2003     left  . KNEE ARTHROSCOPY  2007   Dr Berenice Primas  . [place]   Left Knee Surgery   . labrum tear & bone spur surgery  02/2013   Dr Berenice Primas  . LIPOMA EXCISION     Right Waist  . POSTERIOR CERVICAL FUSION/FORAMINOTOMY N/A 01/07/2016   Procedure: POSTERIOR CERVICAL FUSION LEVEL 5;  Surgeon: Phylliss Bob, MD;  Location: [facility];  Service: Orthopedics;  Laterality: N/A;  Posterior spinal fusion cervical 3-4, cervical 4-5, cervical 5-6, cervical 6-7, cervical 7-thoracic 1 with instrumentation and allograft  . REPLACEMENT TOTAL KNEE  2009   Dr Berenice Primas    Social History   Socioeconomic History  . Marital status: Married    Spouse name: Not on file  . Number of children: Not on file  . Years of education: Not on file  . Highest education level: Not on file  Occupational History  . Occupation: owns company  Social Needs  . Financial resource strain: Not hard at all  . Food insecurity:    Worry: Never true    Inability: Never true  . Transportation needs:    Medical: Yes    Non-medical: Yes  Tobacco Use  . Smoking status: Former Smoker    Types: Cigarettes    Last attempt to quit: 12/20/1979    Years since quitting: 39.0  . Smokeless tobacco: Never Used  . Tobacco comment: smoked 1967-1981, up to 1 ppd  Substance and Sexual Activity  . Alcohol use: Yes    Alcohol/week: 3.0 - 4.0 standard drinks    Types: 3 - 4 Standard drinks or equivalent per week    Comment: occasional beer   . Drug use: No  . Sexual activity: Yes    Partners: Female  Lifestyle  . Physical activity:    Days per week: 6 days    Minutes per session: 60 min  . Stress: Only a little  Relationships  . Social connections:    Talks on phone: More than three times a week    Gets together: More than three times a week    Attends religious service: 1 to 4 times per year    Active member of club or organization: Yes    Attends meetings of clubs or organizations: More than 4 times per year    Relationship  status: Married  Other Topics Concern  . Not on file  Social History Narrative  . Not on file    Family History  Problem Relation Age of Onset  . Alcohol abuse Father   . Diabetes Father   . Alcohol abuse Mother   . Cirrhosis Mother   . Breast cancer Sister   . Lung cancer Paternal Grandfather   . Hyperlipidemia Paternal Grandmother   . Hyperlipidemia Maternal  Grandfather   . Stroke Maternal Grandfather 77  . Heart attack Neg Hx     Review of Systems  Constitutional: Negative for fever.  HENT: Positive for congestion, ear pain, postnasal drip and sore throat.   Eyes: Positive for discharge.  Respiratory: Positive for cough (sometimes productive) and shortness of breath. Negative for wheezing.   Gastrointestinal: Negative for diarrhea (mild loose stools at times) and nausea.  Neurological: Positive for headaches. Negative for light-headedness.       Objective:   Vitals:   12/20/18 1024  BP: 136/80  Pulse: 66  Resp: 16  Temp: 98.7 F (37.1 C)  SpO2: 98%   BP Readings from Last 3 Encounters:  12/20/18 136/80  11/07/18 124/72  08/02/18 124/78   Wt Readings from Last 3 Encounters:  12/20/18 169 lb (76.7 kg)  11/07/18 164 lb (74.4 kg)  08/02/18 165 lb (74.8 kg)   Body mass index is 26.47 kg/m.   Physical Exam    GENERAL APPEARANCE: Appears stated age, well appearing, NAD EYES: conjunctiva clear, no icterus HEENT: bilateral tympanic membranes and ear canals normal, oropharynx with mild erythema, bilateral maxillary sinus tenderness , no thyromegaly, trachea midline, no cervical or supraclavicular lymphadenopathy LUNGS: Clear to auscultation without wheeze or crackles, unlabored breathing, good air entry bilaterally CARDIOVASCULAR: Normal S1,S2 without murmurs, no edema SKIN: Warm, dry      Assessment & Plan:    See Problem List for Assessment and Plan of chronic medical problems.

## 2018-12-20 NOTE — Patient Instructions (Signed)
Take the zpak as prescribed.   Continue over the counter medication as needed.    Call if no improvement     Sinusitis, Adult Sinusitis is inflammation of your sinuses. Sinuses are hollow spaces in the bones around your face. Your sinuses are located:  Around your eyes.  In the middle of your forehead.  Behind your nose.  In your cheekbones. Mucus normally drains out of your sinuses. When your nasal tissues become inflamed or swollen, mucus can become trapped or blocked. This allows bacteria, viruses, and fungi to grow, which leads to infection. Most infections of the sinuses are caused by a virus. Sinusitis can develop quickly. It can last for up to 4 weeks (acute) or for more than 12 weeks (chronic). Sinusitis often develops after a cold. What are the causes? This condition is caused by anything that creates swelling in the sinuses or stops mucus from draining. This includes:  Allergies.  Asthma.  Infection from bacteria or viruses.  Deformities or blockages in your nose or sinuses.  Abnormal growths in the nose (nasal polyps).  Pollutants, such as chemicals or irritants in the air.  Infection from fungi (rare). What increases the risk? You are more likely to develop this condition if you:  Have a weak body defense system (immune system).  Do a lot of swimming or diving.  Overuse nasal sprays.  Smoke. What are the signs or symptoms? The main symptoms of this condition are pain and a feeling of pressure around the affected sinuses. Other symptoms include:  Stuffy nose or congestion.  Thick drainage from your nose.  Swelling and warmth over the affected sinuses.  Headache.  Upper toothache.  A cough that may get worse at night.  Extra mucus that collects in the throat or the back of the nose (postnasal drip).  Decreased sense of smell and taste.  Fatigue.  A fever.  Sore throat.  Bad breath. How is this diagnosed? This condition is diagnosed  based on:  Your symptoms.  Your medical history.  A physical exam.  Tests to find out if your condition is acute or chronic. This may include: ? Checking your nose for nasal polyps. ? Viewing your sinuses using a device that has a light (endoscope). ? Testing for allergies or bacteria. ? Imaging tests, such as an MRI or CT scan. In rare cases, a bone biopsy may be done to rule out more serious types of fungal sinus disease. How is this treated? Treatment for sinusitis depends on the cause and whether your condition is chronic or acute.  If caused by a virus, your symptoms should go away on their own within 10 days. You may be given medicines to relieve symptoms. They include: ? Medicines that shrink swollen nasal passages (topical intranasal decongestants). ? Medicines that treat allergies (antihistamines). ? A spray that eases inflammation of the nostrils (topical intranasal corticosteroids). ? Rinses that help get rid of thick mucus in your nose (nasal saline washes).  If caused by bacteria, your health care provider may recommend waiting to see if your symptoms improve. Most bacterial infections will get better without antibiotic medicine. You may be given antibiotics if you have: ? A severe infection. ? A weak immune system.  If caused by narrow nasal passages or nasal polyps, you may need to have surgery. Follow these instructions at home: Medicines  Take, use, or apply over-the-counter and prescription medicines only as told by your health care provider. These may include nasal sprays.  If  you were prescribed an antibiotic medicine, take it as told by your health care provider. Do not stop taking the antibiotic even if you start to feel better. Hydrate and humidify   Drink enough fluid to keep your urine pale yellow. Staying hydrated will help to thin your mucus.  Use a cool mist humidifier to keep the humidity level in your home above 50%.  Inhale steam for 10-15  minutes, 3-4 times a day, or as told by your health care provider. You can do this in the bathroom while a hot shower is running.  Limit your exposure to cool or dry air. Rest  Rest as much as possible.  Sleep with your head raised (elevated).  Make sure you get enough sleep each night. General instructions   Apply a warm, moist washcloth to your face 3-4 times a day or as told by your health care provider. This will help with discomfort.  Wash your hands often with soap and water to reduce your exposure to germs. If soap and water are not available, use hand sanitizer.  Do not smoke. Avoid being around people who are smoking (secondhand smoke).  Keep all follow-up visits as told by your health care provider. This is important. Contact a health care provider if:  You have a fever.  Your symptoms get worse.  Your symptoms do not improve within 10 days. Get help right away if:  You have a severe headache.  You have persistent vomiting.  You have severe pain or swelling around your face or eyes.  You have vision problems.  You develop confusion.  Your neck is stiff.  You have trouble breathing. Summary  Sinusitis is soreness and inflammation of your sinuses. Sinuses are hollow spaces in the bones around your face.  This condition is caused by nasal tissues that become inflamed or swollen. The swelling traps or blocks the flow of mucus. This allows bacteria, viruses, and fungi to grow, which leads to infection.  If you were prescribed an antibiotic medicine, take it as told by your health care provider. Do not stop taking the antibiotic even if you start to feel better.  Keep all follow-up visits as told by your health care provider. This is important. This information is not intended to replace advice given to you by your health care provider. Make sure you discuss any questions you have with your health care provider. Document Released: 12/05/2005 Document Revised:  05/07/2018 Document Reviewed: 05/07/2018 Elsevier Interactive Patient Education  2019 [employer].

## 2018-12-25 ENCOUNTER — Other Ambulatory Visit: Payer: Self-pay

## 2018-12-25 MED ORDER — CEFDINIR 300 MG PO CAPS: 300.0000 mg | ORAL_CAPSULE | Freq: Two times a day (BID) | ORAL | 0 refills | Status: DC

## 2018-12-25 NOTE — Telephone Encounter (Signed)
Copied from [place] [id number]. Topic: General - Other >> Dec 25, 2018  8:39 AM Alanda Slim E wrote: Reason for CRM: Pt was seen by Dr. Quay Burow on 01.02.2020. Pt called in due to not feeling any better. Face aches from the sinus pressure. Zpack didn't have any impact and Pt has been using a humidifier. Pt has mucus and it is now in his chest and causing it to be harder to breath/ please advise

## 2018-12-25 NOTE — Telephone Encounter (Signed)
Start a different antibiotic - pending -- omnicef x 10 days

## 2018-12-31 ENCOUNTER — Other Ambulatory Visit: Payer: Self-pay | Admitting: Internal Medicine

## 2019-01-28 MED FILL — SHINGRIX 50 MCG SUS: 50 | 1 days supply | Qty: 1 | Fill #1

## 2019-02-23 ENCOUNTER — Encounter: Payer: Self-pay | Admitting: Internal Medicine

## 2019-03-30 ENCOUNTER — Encounter: Payer: Self-pay | Admitting: Internal Medicine

## 2019-04-03 ENCOUNTER — Other Ambulatory Visit: Payer: Self-pay | Admitting: Internal Medicine

## 2019-07-06 ENCOUNTER — Other Ambulatory Visit: Payer: Self-pay | Admitting: Internal Medicine

## 2019-07-15 ENCOUNTER — Encounter: Payer: Self-pay | Admitting: Internal Medicine

## 2019-07-15 ENCOUNTER — Ambulatory Visit (INDEPENDENT_AMBULATORY_CARE_PROVIDER_SITE_OTHER): Payer: Medicare HMO | Admitting: Internal Medicine

## 2019-07-15 ENCOUNTER — Other Ambulatory Visit (INDEPENDENT_AMBULATORY_CARE_PROVIDER_SITE_OTHER): Payer: Medicare HMO

## 2019-07-15 ENCOUNTER — Other Ambulatory Visit: Payer: Self-pay

## 2019-07-15 VITALS — BP 144/82 | HR 78 | Temp 98.2°F | Resp 16 | Ht 67.0 in | Wt 160.0 lb

## 2019-07-15 DIAGNOSIS — R739 Hyperglycemia, unspecified: Secondary | ICD-10-CM | POA: Diagnosis not present

## 2019-07-15 DIAGNOSIS — Z125 Encounter for screening for malignant neoplasm of prostate: Secondary | ICD-10-CM

## 2019-07-15 DIAGNOSIS — I10 Essential (primary) hypertension: Secondary | ICD-10-CM

## 2019-07-15 DIAGNOSIS — E782 Mixed hyperlipidemia: Secondary | ICD-10-CM

## 2019-07-15 DIAGNOSIS — J309 Allergic rhinitis, unspecified: Secondary | ICD-10-CM

## 2019-07-15 LAB — CBC WITH DIFFERENTIAL/PLATELET
Basophils Absolute: 0.1 10*3/uL (ref 0.0–0.1)
Basophils Relative: 1 % (ref 0.0–3.0)
Eosinophils Absolute: 0.2 10*3/uL (ref 0.0–0.7)
Eosinophils Relative: 2.7 % (ref 0.0–5.0)
HCT: 41.4 % (ref 39.0–52.0)
Hemoglobin: 14.2 g/dL (ref 13.0–17.0)
Lymphocytes Relative: 25.7 % (ref 12.0–46.0)
Lymphs Abs: 1.9 10*3/uL (ref 0.7–4.0)
MCHC: 34.2 g/dL (ref 30.0–36.0)
MCV: 92 fl (ref 78.0–100.0)
Monocytes Absolute: 0.6 10*3/uL (ref 0.1–1.0)
Monocytes Relative: 8.7 % (ref 3.0–12.0)
Neutro Abs: 4.6 10*3/uL (ref 1.4–7.7)
Neutrophils Relative %: 61.9 % (ref 43.0–77.0)
Platelets: 232 10*3/uL (ref 150.0–400.0)
RBC: 4.5 Mil/uL (ref 4.22–5.81)
RDW: 13.7 % (ref 11.5–15.5)
WBC: 7.4 10*3/uL (ref 4.0–10.5)

## 2019-07-15 LAB — COMPREHENSIVE METABOLIC PANEL
ALT: 17 U/L (ref 0–53)
AST: 26 U/L (ref 0–37)
Albumin: 4.3 g/dL (ref 3.5–5.2)
Alkaline Phosphatase: 69 U/L (ref 39–117)
BUN: 16 mg/dL (ref 6–23)
CO2: 30 mEq/L (ref 19–32)
Calcium: 9.5 mg/dL (ref 8.4–10.5)
Chloride: 105 mEq/L (ref 96–112)
Creatinine, Ser: 0.96 mg/dL (ref 0.40–1.50)
GFR: 77.54 mL/min (ref >60.00)
Glucose, Bld: 110 mg/dL — ABNORMAL HIGH (ref 70–99)
Potassium: 3.9 mEq/L (ref 3.5–5.1)
Sodium: 142 mEq/L (ref 135–145)
Total Bilirubin: 0.5 mg/dL (ref 0.2–1.2)
Total Protein: 6.7 g/dL (ref 6.0–8.3)

## 2019-07-15 LAB — HEMOGLOBIN A1C: Hgb A1c MFr Bld: 5.6 % (ref 4.6–6.5)

## 2019-07-15 LAB — LIPID PANEL
Cholesterol: 163 mg/dL (ref 0–200)
HDL: 63.6 mg/dL (ref >39.00)
LDL Cholesterol: 73 mg/dL (ref 0–99)
NonHDL: 98.95
Total CHOL/HDL Ratio: 3
Triglycerides: 130 mg/dL (ref 0.0–149.0)
VLDL: 26 mg/dL (ref 0.0–40.0)

## 2019-07-15 LAB — TSH: TSH: 2.22 u[IU]/mL (ref 0.35–4.50)

## 2019-07-15 LAB — PSA, MEDICARE: PSA: 0.74 ng/ml (ref 0.10–4.00)

## 2019-07-15 MED ORDER — MONTELUKAST SODIUM 10 MG PO TABS: 10.0000 mg | ORAL_TABLET | Freq: Every day | ORAL | 3 refills | Status: DC

## 2019-07-15 MED ORDER — LISINOPRIL-HYDROCHLOROTHIAZIDE 20-25 MG PO TABS: 1.0000 | ORAL_TABLET | Freq: Every day | ORAL | 3 refills | Status: DC

## 2019-07-15 NOTE — Assessment & Plan Note (Signed)
BP controlled Current regimen effective and well tolerated Continue current medications at current doses cmp 

## 2019-07-15 NOTE — Assessment & Plan Note (Addendum)
Lifestyle controlled, would like to avoid medication Check lipid panel, CMP, TSH Stressed regular exercise and healthy diet

## 2019-07-15 NOTE — Progress Notes (Signed)
Subjective:    Patient ID: Ryan Cobb, male    DOB: August 03, 1951, 68 y.o.   MRN: [id number]  HPI The patient is here for follow up.  He is exercising regularly.     Hypertension: He is taking his medication daily. He is compliant with a low sodium diet.  He denies chest pain, palpitations, edema, shortness of breath and regular headaches.  He does monitor his blood pressure at home - it is controlled.    Hyperlipidemia: He is not on any medication and controlling his cholesterol with lifestyle. He is compliant with a low fat/cholesterol diet.    Hyperglycemia:  He is compliant with a low sugar/carbohydrate diet.  He is exercising regularly.  Allergies:  He takes his allergy medication and singulair daily.  He feels his allergies have been well controlled.     Medications and allergies reviewed with patient and updated if appropriate.  Patient Active Problem List   Diagnosis Date Noted  . Skin abnormalities 08/02/2018  . Hyperglycemia 09/06/2017  . Slipped rib syndrome 11/02/2016  . Nonallopathic lesion of thoracic region 11/02/2016  . Nonallopathic lesion of rib cage 11/02/2016  . Nonallopathic lesion of lumbosacral region 11/02/2016  . Erectile dysfunction 09/30/2016  . Right-sided chest pain 09/30/2016  . LUQ pain 07/06/2016  . Difficult intubation   . Cervical disc disorder with radiculopathy of cervical region 12/18/2015  . Acute non-recurrent maxillary sinusitis 11/18/2015  . Nonspecific abnormal electrocardiogram (ECG) (EKG) 01/01/2013  . Hyperlipidemia 03/04/2010  . Essential hypertension 03/04/2010  . Allergic rhinitis 03/04/2010  . History of colonic polyps 03/04/2010    Current Outpatient Medications on File Prior to Visit  Medication Sig Dispense Refill  . b complex vitamins tablet Take 1 tablet by mouth daily.    Marland Kitchen Bioflavonoid Products (ESTER C PO) Take 500 mg by mouth daily.    . calcium carbonate (OS-CAL) 600 MG TABS tablet Take 600 mg by mouth  daily.    . Cetirizine HCl (ZYRTEC ALLERGY PO) Take by mouth.    . cholecalciferol (VITAMIN D) 1000 units tablet Take 5,000 Units by mouth 4 (four) times a week.    . Coenzyme Q10 (CO Q 10) 10 MG CAPS Take 10 mg by mouth daily.     . fluticasone (FLONASE) 50 MCG/ACT nasal spray Place 1 spray into the nose 2 (two) times daily. (Patient taking differently: Place 1 spray into the nose daily as needed for allergies or rhinitis. ) 16 g 11  . Garlic 1740 MG CAPS Take 1,000 mg by mouth daily.     Marland Kitchen LECITHIN PO Take 1,200 mg by mouth daily.    Marland Kitchen lisinopril-hydrochlorothiazide (ZESTORETIC) 20-25 MG tablet Take 1 tablet by mouth daily. Annual appt is due in August must see provider for future refills 30 tablet 0  . Magnesium 250 MG TABS Take 250 mg by mouth daily.     . montelukast (SINGULAIR) 10 MG tablet Take 1 tablet (10 mg total) by mouth at bedtime. Annual appt is due in August must see provider for future refills 30 tablet 0  . Multiple Vitamins-Minerals (CENTRUM SILVER PO) Take 1 tablet by mouth daily.     . multivitamin-lutein (OCUVITE-LUTEIN) CAPS capsule Take 1 capsule by mouth daily.    . naproxen sodium (ALEVE) 220 MG tablet Take 220 mg by mouth as needed.    . Omega 3 1200 MG CAPS Take 1,200 mg by mouth daily.      No current facility-administered medications on file  prior to visit.     Past Medical History:  Diagnosis Date  . Arthritis   . Basal cell adenoma    history of  . Cervical disc disorder   . Colon polyps 2006   hyperplastic  . Difficult intubation   . GERD (gastroesophageal reflux disease)   . Hyperlipidemia   . Hypertension   . Seasonal rhinitis    Flonase as needed; Singulair daily    Past Surgical History:  Procedure Laterality Date  . abdominal cyst     removed in 1994  . ANTERIOR CERVICAL DECOMPRESSION/DISCECTOMY FUSION 4 LEVELS N/A 01/06/2016   Procedure: ANTERIOR CERVICAL DECOMPRESSION/DISCECTOMY FUSION 4 LEVELS;  Surgeon: Phylliss Bob, MD;  Location: [facility];  Service: Orthopedics;  Laterality: N/A;  Anterior cerivcal decompression fusion, cervical 3-4, cervical 4-5, cervical 5-6, cervical 6-7 with instrumentation and allograft  . APPENDECTOMY  1995  . CERVICAL FUSION  01/06/2016   POST  LEVEL 5  . CERVICAL LAMINECTOMY  2010   Dr. Carloyn Manner  . COLONOSCOPY W/ POLYPECTOMY  2006   negative 2011  . INGUINAL HERNIA REPAIR  2003   left  . KNEE ARTHROSCOPY  2007   Dr Berenice Primas  . [place]   Left Knee Surgery   . labrum tear & bone spur surgery  02/2013   Dr Berenice Primas  . LIPOMA EXCISION     Right Waist  . POSTERIOR CERVICAL FUSION/FORAMINOTOMY N/A 01/07/2016   Procedure: POSTERIOR CERVICAL FUSION LEVEL 5;  Surgeon: Phylliss Bob, MD;  Location: [facility];  Service: Orthopedics;  Laterality: N/A;  Posterior spinal fusion cervical 3-4, cervical 4-5, cervical 5-6, cervical 6-7, cervical 7-thoracic 1 with instrumentation and allograft  . REPLACEMENT TOTAL KNEE  2009   Dr Berenice Primas    Social History   Socioeconomic History  . Marital status: Married    Spouse name: Not on file  . Number of children: Not on file  . Years of education: Not on file  . Highest education level: Not on file  Occupational History  . Occupation: owns company  Social Needs  . Financial resource strain: Not hard at all  . Food insecurity    Worry: Never true    Inability: Never true  . Transportation needs    Medical: Yes    Non-medical: Yes  Tobacco Use  . Smoking status: Former Smoker    Types: Cigarettes    Quit date: 12/20/1979    Years since quitting: 39.5  . Smokeless tobacco: Never Used  . Tobacco comment: smoked 1967-1981, up to 1 ppd  Substance and Sexual Activity  . Alcohol use: Yes    Alcohol/week: 3.0 - 4.0 standard drinks    Types: 3 - 4 Standard drinks or equivalent per week    Comment: occasional beer   . Drug use: No  . Sexual activity: Yes    Partners: Female  Lifestyle  . Physical activity    Days per week: 6 days    Minutes per session: 60  min  . Stress: Only a little  Relationships  . Social connections    Talks on phone: More than three times a week    Gets together: More than three times a week    Attends religious service: 1 to 4 times per year    Active member of club or organization: Yes    Attends meetings of clubs or organizations: More than 4 times per year    Relationship status: Married  Other Topics Concern  .  Not on file  Social History Narrative  . Not on file    Family History  Problem Relation Age of Onset  . Alcohol abuse Father   . Diabetes Father   . Alcohol abuse Mother   . Cirrhosis Mother   . Breast cancer Sister   . Lung cancer Paternal Grandfather   . Hyperlipidemia Paternal Grandmother   . Hyperlipidemia Maternal Grandfather   . Stroke Maternal Grandfather 45  . Heart attack Neg Hx     Review of Systems  Constitutional: Negative for chills and fever.  Respiratory: Negative for cough, shortness of breath and wheezing.   Cardiovascular: Negative for chest pain, palpitations and leg swelling.  Gastrointestinal: Negative for abdominal pain, constipation, diarrhea and nausea.  Neurological: Negative for light-headedness and headaches.       Objective:   Vitals:   07/15/19 1531  BP: (!) 144/82  Pulse: 78  Resp: 16  Temp: 98.2 F (36.8 C)  SpO2: 95%   BP Readings from Last 3 Encounters:  07/15/19 (!) 144/82  12/20/18 136/80  11/07/18 124/72   Wt Readings from Last 3 Encounters:  07/15/19 160 lb (72.6 kg)  12/20/18 169 lb (76.7 kg)  11/07/18 164 lb (74.4 kg)   Body mass index is 25.06 kg/m.   Physical Exam    Constitutional: Appears well-developed and well-nourished. No distress.  HENT:  Head: Normocephalic and atraumatic.  Neck: Neck supple. No tracheal deviation present. No thyromegaly present.  No cervical lymphadenopathy Cardiovascular: Normal rate, regular rhythm and normal heart sounds.   No murmur heard. No carotid bruit .  No edema Pulmonary/Chest: Effort  normal and breath sounds normal. No respiratory distress. No has no wheezes. No rales.  Skin: Skin is warm and dry. Not diaphoretic.  Psychiatric: Normal mood and affect. Behavior is normal.      Assessment & Plan:    See Problem List for Assessment and Plan of chronic medical problems.

## 2019-07-15 NOTE — Assessment & Plan Note (Signed)
Mild hyperglycemia We will check A1c

## 2019-07-15 NOTE — Assessment & Plan Note (Signed)
Seasonal Allergies overall controlled Continue Singulair, Zyrtec and Flonase

## 2019-07-15 NOTE — Patient Instructions (Addendum)
  Tests ordered today. Your results will be released to [place] (or called to you) after review.  If any changes need to be made, you will be notified at that same time.    Medications reviewed and updated.  Changes include :   none  Your prescription(s) have been submitted to your pharmacy. Please take as directed and contact our office if you believe you are having problem(s) with the medication(s).   Please followup in 1 year

## 2019-07-16 ENCOUNTER — Encounter: Payer: Self-pay | Admitting: Internal Medicine

## 2019-08-22 ENCOUNTER — Ambulatory Visit (INDEPENDENT_AMBULATORY_CARE_PROVIDER_SITE_OTHER): Payer: Medicare HMO

## 2019-08-22 ENCOUNTER — Other Ambulatory Visit: Payer: Self-pay

## 2019-08-22 DIAGNOSIS — Z23 Encounter for immunization: Secondary | ICD-10-CM | POA: Diagnosis not present

## 2019-09-25 DIAGNOSIS — H5213 Myopia, bilateral: Secondary | ICD-10-CM | POA: Diagnosis not present

## 2019-11-21 ENCOUNTER — Encounter: Payer: Self-pay | Admitting: Internal Medicine

## 2020-05-27 DIAGNOSIS — X32XXXD Exposure to sunlight, subsequent encounter: Secondary | ICD-10-CM | POA: Diagnosis not present

## 2020-05-27 DIAGNOSIS — Z1283 Encounter for screening for malignant neoplasm of skin: Secondary | ICD-10-CM | POA: Diagnosis not present

## 2020-05-27 DIAGNOSIS — L718 Other rosacea: Secondary | ICD-10-CM | POA: Diagnosis not present

## 2020-05-27 DIAGNOSIS — D225 Melanocytic nevi of trunk: Secondary | ICD-10-CM | POA: Diagnosis not present

## 2020-05-27 DIAGNOSIS — L570 Actinic keratosis: Secondary | ICD-10-CM | POA: Diagnosis not present

## 2020-05-27 DIAGNOSIS — L820 Inflamed seborrheic keratosis: Secondary | ICD-10-CM | POA: Diagnosis not present

## 2020-06-30 ENCOUNTER — Telehealth: Payer: Self-pay | Admitting: *Deleted

## 2020-06-30 NOTE — Telephone Encounter (Signed)
Dr Hilarie Fredrickson,  Please see attached note about difficult intubation issues- Jenny Reichmann said [employer] case Do you want an OV or direct at [facility]. Pt has a hx of colon polyps  Thanks for your time., Lelan Pons

## 2020-06-30 NOTE — Telephone Encounter (Signed)
Okay for direct outpatient surveillance colonoscopy in the hospital setting due to difficult intubation/airway

## 2020-06-30 NOTE — Telephone Encounter (Signed)
Dottie please add to list, see note below.

## 2020-06-30 NOTE — Telephone Encounter (Signed)
Dr. Hilarie Fredrickson on 8/30 there are 3 pts scheduled but with 5 procedures. Do you want to add this pt or wait until next available hosp date?

## 2020-06-30 NOTE — Telephone Encounter (Signed)
Ryan Cobb,  Can you please schedule Ryan Cobb at [facility] per Dr Hilarie Fredrickson for a colon?  Thanks, Lelan Pons PV

## 2020-06-30 NOTE — Telephone Encounter (Signed)
London Sheer you please review this pt's chart-  Its documented a difficult airway - in 2017 he had a cervical surgery with intubation, the reintubation due to edema and airway issues post op -   69yo male with post op respiratory failure in setting upper airway edema after complicated cervical spine surgery   Please review and advise on [facility] vs [facility]- Thanks ,Lelan Pons

## 2020-06-30 NOTE — Telephone Encounter (Signed)
Called pt and explained need for [facility] case due to difficult intubation- he is aware he will be called at a later date to RS colon at [facility] and Rs PV- I canceled  the PV 7-28 and the Colon 8-17

## 2020-06-30 NOTE — Telephone Encounter (Signed)
Ryan Cobb,  This pt's procedure needs to done at [facility].  Thanks,  [name]

## 2020-06-30 NOTE — Telephone Encounter (Signed)
Next hospital date, but will need to make sure he's on the list so this can be scheduled when sch released

## 2020-08-04 ENCOUNTER — Encounter: Payer: Medicare HMO | Admitting: Internal Medicine

## 2020-08-06 ENCOUNTER — Other Ambulatory Visit: Payer: Self-pay | Admitting: Internal Medicine

## 2020-09-10 ENCOUNTER — Telehealth (INDEPENDENT_AMBULATORY_CARE_PROVIDER_SITE_OTHER): Payer: Medicare HMO | Admitting: Family Medicine

## 2020-09-10 ENCOUNTER — Encounter: Payer: Self-pay | Admitting: Family Medicine

## 2020-09-10 VITALS — Wt 155.0 lb

## 2020-09-10 DIAGNOSIS — R05 Cough: Secondary | ICD-10-CM | POA: Diagnosis not present

## 2020-09-10 DIAGNOSIS — R519 Headache, unspecified: Secondary | ICD-10-CM | POA: Diagnosis not present

## 2020-09-10 DIAGNOSIS — R059 Cough, unspecified: Secondary | ICD-10-CM

## 2020-09-10 DIAGNOSIS — R0981 Nasal congestion: Secondary | ICD-10-CM | POA: Diagnosis not present

## 2020-09-10 MED ORDER — DOXYCYCLINE HYCLATE 100 MG PO TABS: 100.0000 mg | ORAL_TABLET | Freq: Two times a day (BID) | ORAL | 0 refills | Status: DC

## 2020-09-10 NOTE — Progress Notes (Signed)
Virtual Visit via Video Note  I connected with Ryan Cobb  on 09/10/20 at 10:40 AM EDT by a video enabled telemedicine application and verified that I am speaking with the correct person using two identifiers.  Location patient: home, [place] Location provider:work or home office Persons participating in the virtual visit: patient, provider  I discussed the limitations of evaluation and management by telemedicine and the availability of in person appointments. The patient expressed understanding and agreed to proceed.   HPI:  Acute telemedicine visit for Sinus congestion and cough: -Onset: started about 2-3 weeks ago when stayed in a house with cats - reports has allergies to cats -Had a negative COVID19 test when he first got sick -Symptoms include:sneezing, nasal congestion initially - now with this sinus congestion that turning green and R maxillary facial discomfort, cough -Denies: fevers, SOB, loss of taste or smell, NVD, inability to get out of bed/eat/drink -Has tried:musinex - helps with the cough -Pertinent past medical history: has had sinus infections in the past, requesting abx -Pertinent medication allergies: nkda -COVID-19 vaccine status: not vaccinated  ROS: See pertinent positives and negatives per HPI.  Past Medical History:  Diagnosis Date  . Arthritis   . Basal cell adenoma    history of  . Cervical disc disorder   . Colon polyps 2006   hyperplastic  . Difficult intubation   . GERD (gastroesophageal reflux disease)   . Hyperlipidemia   . Hypertension   . Seasonal rhinitis    Flonase as needed; Singulair daily    Past Surgical History:  Procedure Laterality Date  . abdominal cyst     removed in 1994  . ANTERIOR CERVICAL DECOMPRESSION/DISCECTOMY FUSION 4 LEVELS N/A 01/06/2016   Procedure: ANTERIOR CERVICAL DECOMPRESSION/DISCECTOMY FUSION 4 LEVELS;  Surgeon: Phylliss Bob, MD;  Location: [place];  Service: Orthopedics;  Laterality: N/A;  Anterior cerivcal  decompression fusion, cervical 3-4, cervical 4-5, cervical 5-6, cervical 6-7 with instrumentation and allograft  . APPENDECTOMY  1995  . CERVICAL FUSION  01/06/2016   POST  LEVEL 5  . CERVICAL LAMINECTOMY  2010   Dr. Carloyn Manner  . COLONOSCOPY W/ POLYPECTOMY  2006   negative 2011  . INGUINAL HERNIA REPAIR  2003   left  . KNEE ARTHROSCOPY  2007   Dr Berenice Primas  . [place]   Left Knee Surgery   . labrum tear & bone spur surgery  02/2013   Dr Berenice Primas  . LIPOMA EXCISION     Right Waist  . POSTERIOR CERVICAL FUSION/FORAMINOTOMY N/A 01/07/2016   Procedure: POSTERIOR CERVICAL FUSION LEVEL 5;  Surgeon: Phylliss Bob, MD;  Location: [place];  Service: Orthopedics;  Laterality: N/A;  Posterior spinal fusion cervical 3-4, cervical 4-5, cervical 5-6, cervical 6-7, cervical 7-thoracic 1 with instrumentation and allograft  . REPLACEMENT TOTAL KNEE  2009   Dr Berenice Primas     Current Outpatient Medications:  .  b complex vitamins tablet, Take 1 tablet by mouth daily., Disp: , Rfl:  .  Bioflavonoid Products (ESTER C PO), Take 500 mg by mouth daily., Disp: , Rfl:  .  calcium carbonate (OS-CAL) 600 MG TABS tablet, Take 600 mg by mouth daily., Disp: , Rfl:  .  Cetirizine HCl (ZYRTEC ALLERGY PO), Take by mouth., Disp: , Rfl:  .  cholecalciferol (VITAMIN D) 1000 units tablet, Take 5,000 Units by mouth 4 (four) times a week., Disp: , Rfl:  .  Coenzyme Q10 (CO Q 10) 10 MG CAPS, Take 10 mg by mouth daily. ,  Disp: , Rfl:  .  fluticasone (FLONASE) 50 MCG/ACT nasal spray, Place 1 spray into the nose 2 (two) times daily. (Patient taking differently: Place 1 spray into the nose daily as needed for allergies or rhinitis. ), Disp: 16 g, Rfl: 11 .  Garlic 0938 MG CAPS, Take 1,000 mg by mouth daily. , Disp: , Rfl:  .  LECITHIN PO, Take 1,200 mg by mouth daily., Disp: , Rfl:  .  lisinopril-hydrochlorothiazide (ZESTORETIC) 20-25 MG tablet, TAKE 1 TABLET EACH DAY., Disp: 90 tablet, Rfl: 0 .  Magnesium 250 MG TABS, Take 250 mg by  mouth daily. , Disp: , Rfl:  .  montelukast (SINGULAIR) 10 MG tablet, TAKE ONE TABLET AT BEDTIME., Disp: 90 tablet, Rfl: 0 .  Multiple Vitamins-Minerals (CENTRUM SILVER PO), Take 1 tablet by mouth daily. , Disp: , Rfl:  .  multivitamin-lutein (OCUVITE-LUTEIN) CAPS capsule, Take 1 capsule by mouth daily., Disp: , Rfl:  .  naproxen sodium (ALEVE) 220 MG tablet, Take 220 mg by mouth as needed., Disp: , Rfl:  .  Omega 3 1200 MG CAPS, Take 1,200 mg by mouth daily. , Disp: , Rfl:  .  doxycycline (VIBRA-TABS) 100 MG tablet, Take 1 tablet (100 mg total) by mouth 2 (two) times daily., Disp: 20 tablet, Rfl: 0  EXAM:  VITALS per patient if applicable: denies fevers  GENERAL: alert, oriented, appears well and in no acute distress  HEENT: atraumatic, conjunttiva clear, no obvious abnormalities on inspection of external nose and ears  NECK: normal movements of the head and neck  LUNGS: on inspection no signs of respiratory distress, breathing rate appears normal, no obvious gross SOB, gasping or wheezing  CV: no obvious cyanosis  MS: moves all visible extremities without noticeable abnormality  PSYCH/NEURO: pleasant and cooperative, no obvious depression or anxiety, speech and thought processing grossly intact  ASSESSMENT AND PLAN:  Discussed the following assessment and plan:  Sinus congestion  Facial discomfort  Cough  -we discussed possible serious and likely etiologies, options for evaluation and workup, limitations of telemedicine visit vs in person visit, treatment, treatment risks and precautions. Pt prefers to treat via telemedicine empirically rather than in person at this moment.  Query sinusitis vs other. Patient has opted for a trial of empiric treatment with Doxycycline 100mg  bid x 7-10 days.   Advised to seek prompt follow up telemedicine visit or in person care if worsening, new symptoms arise, or if is not improving with treatment. Did let this patient know that I only do  telemedicine on Tuesdays and Thursdays for Center. Advised to schedule follow up visit with PCP or [facility] if any further questions or concerns to avoid delays in care.   I discussed the assessment and treatment plan with the patient. The patient was provided an opportunity to ask questions and all were answered. The patient agreed with the plan and demonstrated an understanding of the instructions.     Lucretia Kern, DO

## 2020-09-16 DIAGNOSIS — H524 Presbyopia: Secondary | ICD-10-CM | POA: Diagnosis not present

## 2020-11-09 ENCOUNTER — Other Ambulatory Visit: Payer: Self-pay | Admitting: Internal Medicine

## 2020-12-08 ENCOUNTER — Other Ambulatory Visit: Payer: Self-pay

## 2020-12-08 MED ORDER — MONTELUKAST SODIUM 10 MG PO TABS: 10.0000 mg | ORAL_TABLET | Freq: Every day | ORAL | 0 refills | Status: DC

## 2020-12-08 MED ORDER — LISINOPRIL-HYDROCHLOROTHIAZIDE 20-25 MG PO TABS: ORAL_TABLET | ORAL | 0 refills | Status: DC

## 2020-12-22 ENCOUNTER — Telehealth: Payer: Self-pay | Admitting: *Deleted

## 2020-12-22 NOTE — Telephone Encounter (Signed)
See 06/30/20 telephone note. Patient needs colonoscopy due to history of colon polyps. He needs hospital procedure due to history of difficult intubation in 2017. I have left a message for patient to call back. We now have hospital availability for 03/16/21 where we could do his procedure.

## 2020-12-23 ENCOUNTER — Encounter: Payer: Self-pay | Admitting: *Deleted

## 2020-12-23 NOTE — Telephone Encounter (Signed)
I have spoken to patient to advise that we have availability to complete his colonoscopy procedure on 03/16/21 at the hospital. However, he indicates that he has moved to [place] so this would take some coordination on his part to make this happen (he is still seeing his previous physicians here in [place]). He is advised that he would need COVID testing on Friday the week prior to his procedure on Tuesday, 03/16/21 and would need to quarantine until the time of his procedure. He verbalizes understanding and indicates that he will call me back if he decides to schedule appointment.

## 2021-01-12 NOTE — Patient Instructions (Addendum)
  Blood work was ordered.     Medications changes include :   protopic for your face - use < 6 weeks - if you have side effects or it does not help see a dermatologist.   Your prescription(s) have been submitted to your pharmacy. Please take as directed and contact our office if you believe you are having problem(s) with the medication(s).    Please followup in 1 year

## 2021-01-12 NOTE — Progress Notes (Unsigned)
Subjective:    Patient ID: Ryan Cobb, male    DOB: Mar 23, 1951, 70 y.o.   MRN: [id number]  HPI The patient is here for follow up of their chronic medical problems, including htn, hyperlipidemia, hyperglycemia, allergies.  He is taking all of his medications as prescribed.    He is exercising regularly.   He walks a few times a week on the beach.  He does yoga and accupuncture.   He has no concerns.   Medications and allergies reviewed with patient and updated if appropriate.  Patient Active Problem List   Diagnosis Date Noted  . Skin abnormalities 08/02/2018  . Hyperglycemia 09/06/2017  . Slipped rib syndrome 11/02/2016  . Nonallopathic lesion of thoracic region 11/02/2016  . Nonallopathic lesion of rib cage 11/02/2016  . Nonallopathic lesion of lumbosacral region 11/02/2016  . Right-sided chest pain 09/30/2016  . LUQ pain 07/06/2016  . Difficult intubation   . Cervical disc disorder with radiculopathy of cervical region 12/18/2015  . Nonspecific abnormal electrocardiogram (ECG) (EKG) 01/01/2013  . Essential hypertension 03/04/2010  . Allergic rhinitis 03/04/2010  . History of colonic polyps 03/04/2010    Current Outpatient Medications on File Prior to Visit  Medication Sig Dispense Refill  . b complex vitamins tablet Take 1 tablet by mouth daily.    Marland Kitchen Bioflavonoid Products (ESTER C PO) Take 500 mg by mouth daily.    . calcium carbonate (OS-CAL) 600 MG TABS tablet Take 600 mg by mouth daily.    . Cetirizine HCl (ZYRTEC ALLERGY PO) Take by mouth.    . cholecalciferol (VITAMIN D) 1000 units tablet Take 5,000 Units by mouth 4 (four) times a week.    . Coenzyme Q10 (CO Q 10) 10 MG CAPS Take 10 mg by mouth daily.     . Garlic 2831 MG CAPS Take 1,000 mg by mouth daily.     Marland Kitchen lisinopril-hydrochlorothiazide (ZESTORETIC) 20-25 MG tablet TAKE 1 TABLET EACH DAY. 30 tablet 0  . Magnesium 250 MG TABS Take 250 mg by mouth daily.     . metroNIDAZOLE (METROGEL) 0.75 % gel Apply  1 application topically daily.    . montelukast (SINGULAIR) 10 MG tablet Take 1 tablet (10 mg total) by mouth at bedtime. 30 tablet 0  . Multiple Vitamins-Minerals (CENTRUM SILVER PO) Take 1 tablet by mouth daily.     . multivitamin-lutein (OCUVITE-LUTEIN) CAPS capsule Take 1 capsule by mouth daily.    . naproxen sodium (ALEVE) 220 MG tablet Take 220 mg by mouth as needed.    . Omega 3 1200 MG CAPS Take 1,200 mg by mouth daily.     . Probiotic Product (PROBIOTIC DAILY PO) Take by mouth. 50,000 billion daily    . Turmeric (QC TUMERIC COMPLEX PO) Take by mouth. Patient takes tumeric and ginger daily    . vitamin E (VITAMIN E) 180 MG (400 UNITS) capsule Take 400 Units by mouth. 3x's a week    . Zinc 30 MG CAPS Take 30 mg by mouth daily.     No current facility-administered medications on file prior to visit.    Past Medical History:  Diagnosis Date  . Arthritis   . Basal cell adenoma    history of  . Cervical disc disorder   . Colon polyps 2006   hyperplastic  . Difficult intubation   . GERD (gastroesophageal reflux disease)   . Hyperlipidemia   . Hypertension   . Seasonal rhinitis    Flonase as needed; Singulair daily  Past Surgical History:  Procedure Laterality Date  . abdominal cyst     removed in 1994  . ANTERIOR CERVICAL DECOMPRESSION/DISCECTOMY FUSION 4 LEVELS N/A 01/06/2016   Procedure: ANTERIOR CERVICAL DECOMPRESSION/DISCECTOMY FUSION 4 LEVELS;  Surgeon: Phylliss Bob, MD;  Location: [facility];  Service: Orthopedics;  Laterality: N/A;  Anterior cerivcal decompression fusion, cervical 3-4, cervical 4-5, cervical 5-6, cervical 6-7 with instrumentation and allograft  . APPENDECTOMY  1995  . CERVICAL FUSION  01/06/2016   POST  LEVEL 5  . CERVICAL LAMINECTOMY  2010   Dr. Carloyn Manner  . COLONOSCOPY W/ POLYPECTOMY  2006   negative 2011  . INGUINAL HERNIA REPAIR  2003   left  . KNEE ARTHROSCOPY  2007   Dr Berenice Primas  . [place]   Left Knee Surgery   . labrum tear & bone spur  surgery  02/2013   Dr Berenice Primas  . LIPOMA EXCISION     Right Waist  . POSTERIOR CERVICAL FUSION/FORAMINOTOMY N/A 01/07/2016   Procedure: POSTERIOR CERVICAL FUSION LEVEL 5;  Surgeon: Phylliss Bob, MD;  Location: [facility];  Service: Orthopedics;  Laterality: N/A;  Posterior spinal fusion cervical 3-4, cervical 4-5, cervical 5-6, cervical 6-7, cervical 7-thoracic 1 with instrumentation and allograft  . REPLACEMENT TOTAL KNEE  2009   Dr Berenice Primas    Social History   Socioeconomic History  . Marital status: Married    Spouse name: Not on file  . Number of children: Not on file  . Years of education: Not on file  . Highest education level: Not on file  Occupational History  . Occupation: owns company  Tobacco Use  . Smoking status: Former Smoker    Types: Cigarettes    Quit date: 12/20/1979    Years since quitting: 41.0  . Smokeless tobacco: Never Used  . Tobacco comment: smoked 1967-1981, up to 1 ppd  Vaping Use  . Vaping Use: Never used  Substance and Sexual Activity  . Alcohol use: Yes    Alcohol/week: 3.0 - 4.0 standard drinks    Types: 3 - 4 Standard drinks or equivalent per week    Comment: occasional beer   . Drug use: No  . Sexual activity: Yes    Partners: Female  Other Topics Concern  . Not on file  Social History Narrative  . Not on file   Social Determinants of Health   Financial Resource Strain: Not on file  Food Insecurity: Not on file  Transportation Needs: Not on file  Physical Activity: Not on file  Stress: Not on file  Social Connections: Not on file    Family History  Problem Relation Age of Onset  . Alcohol abuse Father   . Diabetes Father   . Alcohol abuse Mother   . Cirrhosis Mother   . Breast cancer Sister   . Lung cancer Paternal Grandfather   . Hyperlipidemia Paternal Grandmother   . Hyperlipidemia Maternal Grandfather   . Stroke Maternal Grandfather 79  . Heart attack Neg Hx     Review of Systems  Constitutional: Negative for chills and  fever.  Respiratory: Positive for cough (w/ allergies). Negative for shortness of breath and wheezing.   Cardiovascular: Negative for chest pain, palpitations and leg swelling.  Gastrointestinal:       No gerd  Skin: Positive for rash (on face - derm thought it was rosacea).  Neurological: Negative for light-headedness and headaches.       Objective:   Vitals:   01/13/21 1448  BP: 134/72  Pulse: 67  Temp: 98 F (36.7 C)  SpO2: 97%   BP Readings from Last 3 Encounters:  01/13/21 134/72  07/15/19 (!) 144/82  12/20/18 136/80   Wt Readings from Last 3 Encounters:  01/13/21 158 lb (71.7 kg)  09/10/20 155 lb (70.3 kg)  07/15/19 160 lb (72.6 kg)   Body mass index is 24.75 kg/m.   Physical Exam    Constitutional: Appears well-developed and well-nourished. No distress.  HENT:  Head: Normocephalic and atraumatic.  Neck: Neck supple. No tracheal deviation present. No thyromegaly present.  No cervical lymphadenopathy Cardiovascular: Normal rate, regular rhythm and normal heart sounds.   No murmur heard. No carotid bruit .  No edema Pulmonary/Chest: Effort normal and breath sounds normal. No respiratory distress. No has no wheezes. No rales.  Skin: Skin is warm and dry. Not diaphoretic.  Psychiatric: Normal mood and affect. Behavior is normal.      Assessment & Plan:    See Problem List for Assessment and Plan of chronic medical problems.    This visit occurred during the SARS-CoV-2 public health emergency.  Safety protocols were in place, including screening questions prior to the visit, additional usage of staff PPE, and extensive cleaning of exam room while observing appropriate contact time as indicated for disinfecting solutions.

## 2021-01-13 ENCOUNTER — Encounter: Payer: Self-pay | Admitting: Internal Medicine

## 2021-01-13 ENCOUNTER — Other Ambulatory Visit: Payer: Self-pay

## 2021-01-13 ENCOUNTER — Ambulatory Visit (INDEPENDENT_AMBULATORY_CARE_PROVIDER_SITE_OTHER): Payer: Medicare PPO | Admitting: Internal Medicine

## 2021-01-13 VITALS — BP 134/72 | HR 67 | Temp 98.0°F | Wt 158.0 lb

## 2021-01-13 DIAGNOSIS — L219 Seborrheic dermatitis, unspecified: Secondary | ICD-10-CM | POA: Diagnosis not present

## 2021-01-13 DIAGNOSIS — R739 Hyperglycemia, unspecified: Secondary | ICD-10-CM

## 2021-01-13 DIAGNOSIS — J309 Allergic rhinitis, unspecified: Secondary | ICD-10-CM | POA: Diagnosis not present

## 2021-01-13 DIAGNOSIS — I10 Essential (primary) hypertension: Secondary | ICD-10-CM | POA: Diagnosis not present

## 2021-01-13 LAB — COMPREHENSIVE METABOLIC PANEL
ALT: 20 U/L (ref 0–53)
AST: 25 U/L (ref 0–37)
Albumin: 4.1 g/dL (ref 3.5–5.2)
Alkaline Phosphatase: 56 U/L (ref 39–117)
BUN: 17 mg/dL (ref 6–23)
CO2: 31 mEq/L (ref 19–32)
Calcium: 9.5 mg/dL (ref 8.4–10.5)
Chloride: 105 mEq/L (ref 96–112)
Creatinine, Ser: 0.84 mg/dL (ref 0.40–1.50)
GFR: 88.03 mL/min (ref >60.00)
Glucose, Bld: 74 mg/dL (ref 70–99)
Potassium: 4 mEq/L (ref 3.5–5.1)
Sodium: 140 mEq/L (ref 135–145)
Total Bilirubin: 0.5 mg/dL (ref 0.2–1.2)
Total Protein: 7 g/dL (ref 6.0–8.3)

## 2021-01-13 LAB — CBC WITH DIFFERENTIAL/PLATELET
Basophils Absolute: 0.1 10*3/uL (ref 0.0–0.1)
Basophils Relative: 1.2 % (ref 0.0–3.0)
Eosinophils Absolute: 0.2 10*3/uL (ref 0.0–0.7)
Eosinophils Relative: 2.7 % (ref 0.0–5.0)
HCT: 40.6 % (ref 39.0–52.0)
Hemoglobin: 13.9 g/dL (ref 13.0–17.0)
Lymphocytes Relative: 26.2 % (ref 12.0–46.0)
Lymphs Abs: 1.9 10*3/uL (ref 0.7–4.0)
MCHC: 34.2 g/dL (ref 30.0–36.0)
MCV: 91.5 fl (ref 78.0–100.0)
Monocytes Absolute: 0.7 10*3/uL (ref 0.1–1.0)
Monocytes Relative: 9.2 % (ref 3.0–12.0)
Neutro Abs: 4.4 10*3/uL (ref 1.4–7.7)
Neutrophils Relative %: 60.7 % (ref 43.0–77.0)
Platelets: 199 10*3/uL (ref 150.0–400.0)
RBC: 4.44 Mil/uL (ref 4.22–5.81)
RDW: 13.2 % (ref 11.5–15.5)
WBC: 7.3 10*3/uL (ref 4.0–10.5)

## 2021-01-13 LAB — TSH: TSH: 3.46 u[IU]/mL (ref 0.35–4.50)

## 2021-01-13 LAB — LIPID PANEL
Cholesterol: 169 mg/dL (ref 0–200)
HDL: 61.7 mg/dL (ref >39.00)
LDL Cholesterol: 80 mg/dL (ref 0–99)
NonHDL: 107.29
Total CHOL/HDL Ratio: 3
Triglycerides: 136 mg/dL (ref 0.0–149.0)
VLDL: 27.2 mg/dL (ref 0.0–40.0)

## 2021-01-13 LAB — HEMOGLOBIN A1C: Hgb A1c MFr Bld: 5.5 % (ref 4.6–6.5)

## 2021-01-13 MED ORDER — LISINOPRIL-HYDROCHLOROTHIAZIDE 20-25 MG PO TABS: ORAL_TABLET | ORAL | 1 refills | Status: DC

## 2021-01-13 MED ORDER — TACROLIMUS 0.03 % EX OINT: TOPICAL_OINTMENT | Freq: Two times a day (BID) | CUTANEOUS | 0 refills | Status: DC

## 2021-01-13 MED ORDER — MONTELUKAST SODIUM 10 MG PO TABS: 10.0000 mg | ORAL_TABLET | Freq: Every day | ORAL | 3 refills | Status: DC

## 2021-01-13 NOTE — Assessment & Plan Note (Signed)
Chronic Has erythematous papular rash on forehead, cheeks and nose Saw dermatology and was diagnosed with possible rosacea Has been using metronidazole gel topically for several months without improvement ?  Seborrheic dermatitis Trial of Protopic 0.03% twice daily-advise he cannot use this long-term.  If he has any side effects or if there is no improvement he should go back to see dermatology

## 2021-01-13 NOTE — Assessment & Plan Note (Addendum)
Chronic BP well controlled Continue lisinopril-hctz 20-25 mg daily cmp, lipid panel, CBC, TSH

## 2021-01-13 NOTE — Assessment & Plan Note (Addendum)
Chronic Taking singulair nightly, continue He seems to be more allergic to pet dander, but still has some of his seasonal allergies Continue Singulair 10 mg nightly and over-the-counter allergy medications as needed

## 2021-01-13 NOTE — Assessment & Plan Note (Signed)
Chronic Check a1c Low sugar / carb diet Stressed regular exercise  

## 2021-01-21 ENCOUNTER — Telehealth: Payer: Self-pay | Admitting: Internal Medicine

## 2021-01-21 ENCOUNTER — Other Ambulatory Visit: Payer: Self-pay

## 2021-01-21 MED ORDER — LISINOPRIL-HYDROCHLOROTHIAZIDE 20-25 MG PO TABS: ORAL_TABLET | ORAL | 0 refills | Status: DC

## 2021-01-21 NOTE — Telephone Encounter (Signed)
Faxed in today. 

## 2021-01-21 NOTE — Telephone Encounter (Signed)
Patient contacted [facility] to see where his prescriptions were and they stated it was sent out yesterday and it can take up to ten days to receive the medicine and he has been out of lisinopril-hydrochlorothiazide (ZESTORETIC) 20-25 MG tablet for the past week so he was wondering if we could send in a ten day supply for him to get him by until it gets delievered [place], [place] [address] [place] [address] Phone:  [phone]  Fax:  [phone]

## 2021-03-01 ENCOUNTER — Ambulatory Visit (INDEPENDENT_AMBULATORY_CARE_PROVIDER_SITE_OTHER): Payer: Medicare PPO

## 2021-03-01 DIAGNOSIS — Z0000 Encounter for general adult medical examination without abnormal findings: Secondary | ICD-10-CM | POA: Diagnosis not present

## 2021-03-01 NOTE — Progress Notes (Signed)
I connected with Ryan Cobb today by telephone and verified that I am speaking with the correct person using two identifiers. Location patient: home Location provider: work Persons participating in the virtual visit: Joren Rehm, and [employer], LPN.   I discussed the limitations, risks, security and privacy concerns of performing an evaluation and management service by telephone and the availability of in person appointments. I also discussed with the patient that there may be a patient responsible charge related to this service. The patient expressed understanding and verbally consented to this telephonic visit.    Interactive audio and video telecommunications were attempted between this provider and patient, however failed, due to patient having technical difficulties OR patient did not have access to video capability.  We continued and completed visit with audio only.  Some vital signs may be absent or patient reported.   Time Spent with patient on telephone encounter: 30 minutes  Subjective:   Ryan Cobb is a 70 y.o. male who presents for Medicare Annual/Subsequent preventive examination.  Review of Systems    No ROS. Medicare Wellness Virtual Visit. Additional risk factors are reflected in social history. Cardiac Risk Factors include: advanced age (>16mn, >>16women);family history of premature cardiovascular disease;hypertension;male gender Sleep Patterns: No sleep issues, feels rested on waking and sleeps 6-8 hours nightly. Home Safety/Smoke Alarms: Feels safe in home; uses home alarm. Smoke alarms in place. Living environment: 2-story home; Lives with spouse; no needs for DME; good support system. Seat Belt Safety/Bike Helmet: Wears seat belt.    Objective:    There were no vitals filed for this visit. There is no height or weight on file to calculate BMI.  Advanced Directives 03/01/2021 11/07/2018 09/06/2017 01/06/2016 12/29/2015  Does Patient Have a  Medical Advance Directive? Yes Yes Yes Yes Yes  Type of Advance Directive Living will;Healthcare Power of [place] Wright[place] will Living will;Healthcare Power of Attorney -  Does patient want to make changes to medical advance directive? No - Patient declined - - No - Patient declined -  Copy of HArcadiain Chart? No - copy requested No - copy requested No - copy requested No - copy requested No - copy requested    Current Medications (verified) Outpatient Encounter Medications as of 03/01/2021  Medication Sig  . b complex vitamins tablet Take 1 tablet by mouth daily.  .Marland KitchenBioflavonoid Products (ESTER C PO) Take 500 mg by mouth daily.  . calcium carbonate (OS-CAL) 600 MG TABS tablet Take 600 mg by mouth daily.  . Cetirizine HCl (ZYRTEC ALLERGY PO) Take by mouth.  . cholecalciferol (VITAMIN D) 1000 units tablet Take 5,000 Units by mouth 4 (four) times a week.  . Coenzyme Q10 (CO Q 10) 10 MG CAPS Take 10 mg by mouth daily.   . Garlic 11610MG CAPS Take 1,000 mg by mouth daily.   .Marland Kitchenlisinopril-hydrochlorothiazide (ZESTORETIC) 20-25 MG tablet TAKE 1 TABLET EACH DAY.  . Magnesium 250 MG TABS Take 250 mg by mouth daily.   . metroNIDAZOLE (METROGEL) 0.75 % gel Apply 1 application topically daily.  . montelukast (SINGULAIR) 10 MG tablet Take 1 tablet (10 mg total) by mouth at bedtime.  . Multiple Vitamins-Minerals (CENTRUM SILVER PO) Take 1 tablet by mouth daily.   . multivitamin-lutein (OCUVITE-LUTEIN) CAPS capsule Take 1 capsule by mouth daily.  . naproxen sodium (ALEVE) 220 MG tablet Take 220 mg by mouth as needed.  . Omega 3 1200  MG CAPS Take 1,200 mg by mouth daily.   . Probiotic Product (PROBIOTIC DAILY PO) Take by mouth. 50,000 billion daily  . tacrolimus (PROTOPIC) 0.03 % ointment Apply topically 2 (two) times daily. For face.  Short term use only  . Turmeric (QC TUMERIC COMPLEX PO) Take by mouth. Patient  takes tumeric and ginger daily  . vitamin E (VITAMIN E) 180 MG (400 UNITS) capsule Take 400 Units by mouth. 3x's a week  . Zinc 30 MG CAPS Take 30 mg by mouth daily.   No facility-administered encounter medications on file as of 03/01/2021.    Allergies (verified) Patient has no known allergies.   History: Past Medical History:  Diagnosis Date  . Arthritis   . Basal cell adenoma    history of  . Cervical disc disorder   . Colon polyps 2006   hyperplastic  . Difficult intubation   . GERD (gastroesophageal reflux disease)   . Hyperlipidemia   . Hypertension   . Seasonal rhinitis    Flonase as needed; Singulair daily   Past Surgical History:  Procedure Laterality Date  . abdominal cyst     removed in 1994  . ANTERIOR CERVICAL DECOMPRESSION/DISCECTOMY FUSION 4 LEVELS N/A 01/06/2016   Procedure: ANTERIOR CERVICAL DECOMPRESSION/DISCECTOMY FUSION 4 LEVELS;  Surgeon: Phylliss Bob, MD;  Location: [facility];  Service: Orthopedics;  Laterality: N/A;  Anterior cerivcal decompression fusion, cervical 3-4, cervical 4-5, cervical 5-6, cervical 6-7 with instrumentation and allograft  . APPENDECTOMY  1995  . CERVICAL FUSION  01/06/2016   POST  LEVEL 5  . CERVICAL LAMINECTOMY  2010   Dr. Carloyn Manner  . COLONOSCOPY W/ POLYPECTOMY  2006   negative 2011  . INGUINAL HERNIA REPAIR  2003   left  . KNEE ARTHROSCOPY  2007   Dr Berenice Primas  . [place]   Left Knee Surgery   . labrum tear & bone spur surgery  02/2013   Dr Berenice Primas  . LIPOMA EXCISION     Right Waist  . POSTERIOR CERVICAL FUSION/FORAMINOTOMY N/A 01/07/2016   Procedure: POSTERIOR CERVICAL FUSION LEVEL 5;  Surgeon: Phylliss Bob, MD;  Location: [facility];  Service: Orthopedics;  Laterality: N/A;  Posterior spinal fusion cervical 3-4, cervical 4-5, cervical 5-6, cervical 6-7, cervical 7-thoracic 1 with instrumentation and allograft  . REPLACEMENT TOTAL KNEE  2009   Dr Berenice Primas   Family History  Problem Relation Age of Onset  . Alcohol abuse  Father   . Diabetes Father   . Alcohol abuse Mother   . Cirrhosis Mother   . Breast cancer Sister   . Lung cancer Paternal Grandfather   . Hyperlipidemia Paternal Grandmother   . Hyperlipidemia Maternal Grandfather   . Stroke Maternal Grandfather 10  . Heart attack Neg Hx    Social History   Socioeconomic History  . Marital status: Married    Spouse name: Not on file  . Number of children: Not on file  . Years of education: Not on file  . Highest education level: Not on file  Occupational History  . Occupation: owns company  Tobacco Use  . Smoking status: Former Smoker    Types: Cigarettes    Quit date: 12/20/1979    Years since quitting: 41.2  . Smokeless tobacco: Never Used  . Tobacco comment: smoked 1967-1981, up to 1 ppd  Vaping Use  . Vaping Use: Never used  Substance and Sexual Activity  . Alcohol use: Yes    Alcohol/week: 3.0 - 4.0 standard drinks  Types: 3 - 4 Standard drinks or equivalent per week    Comment: occasional beer   . Drug use: No  . Sexual activity: Yes    Partners: Female  Other Topics Concern  . Not on file  Social History Narrative  . Not on file   Social Determinants of Health   Financial Resource Strain: Low Risk   . Difficulty of Paying Living Expenses: Not hard at all  Food Insecurity: No Food Insecurity  . Worried About Charity fundraiser in the Last Year: Never true  . Ran Out of Food in the Last Year: Never true  Transportation Needs: No Transportation Needs  . Lack of Transportation (Medical): No  . Lack of Transportation (Non-Medical): No  Physical Activity: Sufficiently Active  . Days of Exercise per Week: 5 days  . Minutes of Exercise per Session: 30 min  Stress: No Stress Concern Present  . Feeling of Stress : Not at all  Social Connections: Socially Integrated  . Frequency of Communication with Friends and Family: More than three times a week  . Frequency of Social Gatherings with Friends and Family: More than three  times a week  . Attends Religious Services: More than 4 times per year  . Active Member of Clubs or Organizations: Yes  . Attends Archivist Meetings: More than 4 times per year  . Marital Status: Married    Tobacco Counseling Counseling given: Not Answered Comment: smoked [id number], up to 1 ppd   Clinical Intake:  Pre-visit preparation completed: Yes  Pain : No/denies pain     Nutritional Risks: None Diabetes: No  How often do you need to have someone help you when you read instructions, pamphlets, or other written materials from your doctor or pharmacy?: 1 - Never What is the last grade level you completed in school?: Bachelor's Degree in Accounting  Diabetic? no  Interpreter Needed?: No  Information entered by :: Lisette Abu, LPN   Activities of Daily Living In your present state of health, do you have any difficulty performing the following activities: 03/01/2021 01/13/2021  Hearing? N N  Comment wears hearing aids -  Vision? N N  Difficulty concentrating or making decisions? N N  Walking or climbing stairs? N N  Dressing or bathing? N N  Doing errands, shopping? N N  Preparing Food and eating ? N -  Using the Toilet? N -  In the past six months, have you accidently leaked urine? N -  Do you have problems with loss of bowel control? N -  Managing your Medications? N -  Managing your Finances? N -  Housekeeping or managing your Housekeeping? N -  Some recent data might be hidden    Patient Care Team: Binnie Rail, MD as PCP - General (Internal Medicine)  Indicate any recent Medical Services you may have received from other than [facility] providers in the past year (date may be approximate).     Assessment:   This is a routine wellness examination for Rusty.  Hearing/Vision screen No exam data present  Dietary issues and exercise activities discussed: Current Exercise Habits: Home exercise routine, Type of exercise: walking, Time  (Minutes): 30, Frequency (Times/Week): 5, Weekly Exercise (Minutes/Week): 150, Intensity: Moderate, Exercise limited by: None identified  Goals    . Patient Stated     Continue to travel, work around the house, and perhaps volunteer in the community or do something that gives to others. Love and spend time with family.    Marland Kitchen  Stay as active and as independent as possible      Depression Screen PHQ 2/9 Scores 03/01/2021 01/13/2021 11/07/2018 09/06/2017 09/30/2016 01/23/2015  PHQ - 2 Score 0 0 0 2 0 0  PHQ- 9 Score - - 1 5 - -    Fall Risk Fall Risk  01/13/2021 11/07/2018 09/06/2017 09/30/2016 01/23/2015  Falls in the past year? 0 0 No No No  Number falls in past yr: 0 - - - -  Injury with Fall? 0 - - - -  Risk for fall due to : No Fall Risks - - - -  Follow up Falls evaluation completed - - - -    FALL RISK PREVENTION PERTAINING TO THE HOME:  Any stairs in or around the home? Yes  If so, are there any without handrails? No  Home free of loose throw rugs in walkways, pet beds, electrical cords, etc? Yes  Adequate lighting in your home to reduce risk of falls? Yes   ASSISTIVE DEVICES UTILIZED TO PREVENT FALLS:  Life alert? No  Use of a cane, walker or w/c? No  Grab bars in the bathroom? Yes  Shower chair or bench in shower? No  Elevated toilet seat or a handicapped toilet? No   TIMED UP AND GO:  Was the test performed? No .  Length of time to ambulate 10 feet: 0 sec.   Gait steady and fast without use of assistive device  Cognitive Function: MMSE - Mini Mental State Exam 11/07/2018  Orientation to time 5  Orientation to Place 5  Registration 3  Attention/ Calculation 5  Recall 3  Language- name 2 objects 2  Language- repeat 1  Language- follow 3 step command 3  Language- read & follow direction 1  Write a sentence 1  Copy design 1  Total score 30        Immunizations Immunization History  Administered Date(s) Administered  . Fluad Quad(high Dose 65+) 08/22/2019  .  Influenza Whole 09/18/2012  . Influenza, High Dose Seasonal PF 09/30/2016, 09/06/2017, 10/16/2018  . Influenza-Unspecified 10/12/2013, 09/18/2014, 09/20/2015  . MMR 04/20/2018, 05/21/2018  . Pneumococcal Conjugate-13 12/18/2015  . Pneumococcal Polysaccharide-23 09/06/2017  . Tdap 02/03/2012, 06/28/2018  . Zoster 10/18/2012  . Zoster Recombinat (Shingrix) 11/22/2018, 02/01/2019    TDAP status: Up to date  Flu Vaccine status: Due, Education has been provided regarding the importance of this vaccine. Advised may receive this vaccine at local pharmacy or Health Dept. Aware to provide a copy of the vaccination record if obtained from local pharmacy or Health Dept. Verbalized acceptance and understanding.  Pneumococcal vaccine status: Up to date  Covid-19 vaccine status: Declined, Education has been provided regarding the importance of this vaccine but patient still declined. Advised may receive this vaccine at local pharmacy or Health Dept.or vaccine clinic. Aware to provide a copy of the vaccination record if obtained from local pharmacy or Health Dept. Verbalized acceptance and understanding.  Qualifies for Shingles Vaccine? Yes   Zostavax completed Yes   Shingrix Completed?: Yes  Screening Tests Health Maintenance  Topic Date Due  . COVID-19 Vaccine (1) Never done  . COLONOSCOPY (Pts 45-27yr Insurance coverage will need to be confirmed)  05/25/2020  . INFLUENZA VACCINE  03/18/2021 (Originally 07/19/2020)  . TETANUS/TDAP  06/28/2028  . Hepatitis C Screening  Completed  . PNA vac Low Risk Adult  Completed  . HPV VACCINES  Aged Out    Health Maintenance  Health Maintenance Due  Topic Date Due  .  COVID-19 Vaccine (1) Never done  . COLONOSCOPY (Pts 45-83yr Insurance coverage will need to be confirmed)  05/25/2020    Colorectal cancer screening: Type of screening: Colonoscopy. Completed 05/26/2015. Repeat every 5 years  [place]  Lung Cancer Screening: (Low Dose  CT Chest recommended if Age 71-80years, 30 pack-year currently smoking OR have quit w/in 15years.) does qualify.   Lung Cancer Screening Referral: no  Additional Screening:  Hepatitis C Screening: does qualify; Completed yes  Vision Screening: Recommended annual ophthalmology exams for early detection of glaucoma and other disorders of the eye. Is the patient up to date with their annual eye exam?  Yes  Who is the provider or what is the name of the office in which the patient attends annual eye exams? Howard McFarland, OD. If pt is not established with a provider, would they like to be referred to a provider to establish care? No .   Dental Screening: Recommended annual dental exams for proper oral hygiene  Community Resource Referral / Chronic Care Management: CRR required this visit?  No   CCM required this visit?  No      Plan:     I have personally reviewed and noted the following in the patient's chart:   . Medical and social history . Use of alcohol, tobacco or illicit drugs  . Current medications and supplements . Functional ability and status . Nutritional status . Physical activity . Advanced directives . List of other physicians . Hospitalizations, surgeries, and ER visits in previous 12 months . Vitals . Screenings to include cognitive, depression, and falls . Referrals and appointments  In addition, I have reviewed and discussed with patient certain preventive protocols, quality metrics, and best practice recommendations. A written personalized care plan for preventive services as well as general preventive health recommendations were provided to patient.     SSheral Flow LPN   30/34/9611  Nurse Notes:  Patient is cogitatively intact. There were no vitals filed for this visit. There is no height or weight on file to calculate BMI. Patient stated that he has no issues with gait or balance; does not use any assistive devices. Medications reviewed  with patient; no opioid use noted.

## 2021-03-01 NOTE — Patient Instructions (Signed)
[name] , Thank you for taking time to come for your Medicare Wellness Visit. I appreciate your ongoing commitment to your health goals. Please review the following plan we discussed and let me know if I can assist you in the future.   Screening recommendations/referrals: Colonoscopy: 05/26/2015; due every 5 years (will schedule in [place]) Recommended yearly ophthalmology/optometry visit for glaucoma screening and checkup Recommended yearly dental visit for hygiene and checkup  Vaccinations: Influenza vaccine: 08/22/2019 Pneumococcal vaccine: 12/18/2015, 09/06/2017 Tdap vaccine: 06/28/2018 Shingles vaccine: 11/22/2018, 02/01/2019   Covid-19: declined  Advanced directives: Please bring a copy of your health care power of attorney and living will to the office at your convenience.  Conditions/risks identified: Yes; Reviewed health maintenance screenings with patient today and relevant education, vaccines, and/or referrals were provided. Please continue to do your personal lifestyle choices by: daily care of teeth and gums, regular physical activity (goal should be 5 days a week for 30 minutes), eat a healthy diet, avoid tobacco and drug use, limiting any alcohol intake, taking a low-dose aspirin (if not allergic or have been advised by your provider otherwise) and taking vitamins and minerals as recommended by your provider. Continue doing brain stimulating activities (puzzles, reading, adult coloring books, staying active) to keep memory sharp. Continue to eat heart healthy diet (full of fruits, vegetables, whole grains, lean protein, water--limit salt, fat, and sugar intake) and increase physical activity as tolerated.  Next appointment: Please schedule your next Medicare Wellness Visit with your Nurse Health Advisor in 1 year by calling [phone].  Preventive Care 71 Years and Older, Male Preventive care refers to lifestyle choices and visits with your health care provider that can promote  health and wellness. What does preventive care include?  A yearly physical exam. This is also called an annual well check.  Dental exams once or twice a year.  Routine eye exams. Ask your health care provider how often you should have your eyes checked.  Personal lifestyle choices, including:  Daily care of your teeth and gums.  Regular physical activity.  Eating a healthy diet.  Avoiding tobacco and drug use.  Limiting alcohol use.  Practicing safe sex.  Taking low doses of aspirin every day.  Taking vitamin and mineral supplements as recommended by your health care provider. What happens during an annual well check? The services and screenings done by your health care provider during your annual well check will depend on your age, overall health, lifestyle risk factors, and family history of disease. Counseling  Your health care provider may ask you questions about your:  Alcohol use.  Tobacco use.  Drug use.  Emotional well-being.  Home and relationship well-being.  Sexual activity.  Eating habits.  History of falls.  Memory and ability to understand (cognition).  Work and work Statistician. Screening  You may have the following tests or measurements:  Height, weight, and BMI.  Blood pressure.  Lipid and cholesterol levels. These may be checked every 5 years, or more frequently if you are over 93 years old.  Skin check.  Lung cancer screening. You may have this screening every year starting at age 75 if you have a 30-pack-year history of smoking and currently smoke or have quit within the past 15 years.  Fecal occult blood test (FOBT) of the stool. You may have this test every year starting at age 1.  Flexible sigmoidoscopy or colonoscopy. You may have a sigmoidoscopy every 5 years or a colonoscopy every 10 years starting at age  50.  Prostate cancer screening. Recommendations will vary depending on your family history and other risks.  Hepatitis  C blood test.  Hepatitis B blood test.  Sexually transmitted disease (STD) testing.  Diabetes screening. This is done by checking your blood sugar (glucose) after you have not eaten for a while (fasting). You may have this done every 1-3 years.  Abdominal aortic aneurysm (AAA) screening. You may need this if you are a current or former smoker.  Osteoporosis. You may be screened starting at age 64 if you are at high risk. Talk with your health care provider about your test results, treatment options, and if necessary, the need for more tests. Vaccines  Your health care provider may recommend certain vaccines, such as:  Influenza vaccine. This is recommended every year.  Tetanus, diphtheria, and acellular pertussis (Tdap, Td) vaccine. You may need a Td booster every 10 years.  Zoster vaccine. You may need this after age 68.  Pneumococcal 13-valent conjugate (PCV13) vaccine. One dose is recommended after age 71.  Pneumococcal polysaccharide (PPSV23) vaccine. One dose is recommended after age 73. Talk to your health care provider about which screenings and vaccines you need and how often you need them. This information is not intended to replace advice given to you by your health care provider. Make sure you discuss any questions you have with your health care provider. Document Released: 01/01/2016 Document Revised: 08/24/2016 Document Reviewed: 10/06/2015 Elsevier Interactive Patient Education  2017 [place] Prevention in the Home Falls can cause injuries. They can happen to people of all ages. There are many things you can do to make your home safe and to help prevent falls. What can I do on the outside of my home?  Regularly fix the edges of walkways and driveways and fix any cracks.  Remove anything that might make you trip as you walk through a door, such as a raised step or threshold.  Trim any bushes or trees on the path to your home.  Use bright outdoor  lighting.  Clear any walking paths of anything that might make someone trip, such as rocks or tools.  Regularly check to see if handrails are loose or broken. Make sure that both sides of any steps have handrails.  Any raised decks and porches should have guardrails on the edges.  Have any leaves, snow, or ice cleared regularly.  Use sand or salt on walking paths during winter.  Clean up any spills in your garage right away. This includes oil or grease spills. What can I do in the bathroom?  Use night lights.  Install grab bars by the toilet and in the tub and shower. Do not use towel bars as grab bars.  Use non-skid mats or decals in the tub or shower.  If you need to sit down in the shower, use a plastic, non-slip stool.  Keep the floor dry. Clean up any water that spills on the floor as soon as it happens.  Remove soap buildup in the tub or shower regularly.  Attach bath mats securely with double-sided non-slip rug tape.  Do not have throw rugs and other things on the floor that can make you trip. What can I do in the bedroom?  Use night lights.  Make sure that you have a light by your bed that is easy to reach.  Do not use any sheets or blankets that are too big for your bed. They should not hang down onto the floor.  Have a firm chair that has side arms. You can use this for support while you get dressed.  Do not have throw rugs and other things on the floor that can make you trip. What can I do in the kitchen?  Clean up any spills right away.  Avoid walking on wet floors.  Keep items that you use a lot in easy-to-reach places.  If you need to reach something above you, use a strong step stool that has a grab bar.  Keep electrical cords out of the way.  Do not use floor polish or wax that makes floors slippery. If you must use wax, use non-skid floor wax.  Do not have throw rugs and other things on the floor that can make you trip. What can I do with my  stairs?  Do not leave any items on the stairs.  Make sure that there are handrails on both sides of the stairs and use them. Fix handrails that are broken or loose. Make sure that handrails are as long as the stairways.  Check any carpeting to make sure that it is firmly attached to the stairs. Fix any carpet that is loose or worn.  Avoid having throw rugs at the top or bottom of the stairs. If you do have throw rugs, attach them to the floor with carpet tape.  Make sure that you have a light switch at the top of the stairs and the bottom of the stairs. If you do not have them, ask someone to add them for you. What else can I do to help prevent falls?  Wear shoes that:  Do not have high heels.  Have rubber bottoms.  Are comfortable and fit you well.  Are closed at the toe. Do not wear sandals.  If you use a stepladder:  Make sure that it is fully opened. Do not climb a closed stepladder.  Make sure that both sides of the stepladder are locked into place.  Ask someone to hold it for you, if possible.  Clearly mark and make sure that you can see:  Any grab bars or handrails.  First and last steps.  Where the edge of each step is.  Use tools that help you move around (mobility aids) if they are needed. These include:  Canes.  Walkers.  Scooters.  Crutches.  Turn on the lights when you go into a dark area. Replace any light bulbs as soon as they burn out.  Set up your furniture so you have a clear path. Avoid moving your furniture around.  If any of your floors are uneven, fix them.  If there are any pets around you, be aware of where they are.  Review your medicines with your doctor. Some medicines can make you feel dizzy. This can increase your chance of falling. Ask your doctor what other things that you can do to help prevent falls. This information is not intended to replace advice given to you by your health care provider. Make sure you discuss any  questions you have with your health care provider. Document Released: 10/01/2009 Document Revised: 05/12/2016 Document Reviewed: 01/09/2015 Elsevier Interactive Patient Education  2017 [employer].

## 2021-05-28 LAB — HM COLONOSCOPY

## 2021-08-18 ENCOUNTER — Telehealth: Payer: Self-pay | Admitting: Internal Medicine

## 2021-08-18 ENCOUNTER — Other Ambulatory Visit: Payer: Self-pay

## 2021-08-18 MED ORDER — LISINOPRIL-HYDROCHLOROTHIAZIDE 20-25 MG PO TABS: ORAL_TABLET | ORAL | 1 refills | Status: DC

## 2021-08-18 NOTE — Telephone Encounter (Signed)
   Patient requesting refill for lisinopril-hydrochlorothiazide (ZESTORETIC) 20-25 MG tablet   [facility] - [place], [place]

## 2021-08-18 NOTE — Telephone Encounter (Signed)
Sent in today for patient. 

## 2022-01-31 ENCOUNTER — Other Ambulatory Visit: Payer: Self-pay | Admitting: Internal Medicine

## 2022-02-20 ENCOUNTER — Encounter: Payer: Self-pay | Admitting: Internal Medicine

## 2022-02-20 NOTE — Patient Instructions (Addendum)
? ? ? ?Blood work was ordered.   ? ? ?Medications changes include :   none ? ? ?Your prescription(s) have been sent to your pharmacy.  ? ? ? ?Return in about 1 year (around 02/22/2023) for CPE. ? ? ? ?Health Maintenance, Male ?Adopting a healthy lifestyle and getting preventive care are important in promoting health and wellness. Ask your health care provider about: ?The right schedule for you to have regular tests and exams. ?Things you can do on your own to prevent diseases and keep yourself healthy. ?What should I know about diet, weight, and exercise? ?Eat a healthy diet ? ?Eat a diet that includes plenty of vegetables, fruits, low-fat dairy products, and lean protein. ?Do not eat a lot of foods that are high in solid fats, added sugars, or sodium. ?Maintain a healthy weight ?Body mass index (BMI) is a measurement that can be used to identify possible weight problems. It estimates body fat based on height and weight. Your health care provider can help determine your BMI and help you achieve or maintain a healthy weight. ?Get regular exercise ?Get regular exercise. This is one of the most important things you can do for your health. Most adults should: ?Exercise for at least 150 minutes each week. The exercise should increase your heart rate and make you sweat (moderate-intensity exercise). ?Do strengthening exercises at least twice a week. This is in addition to the moderate-intensity exercise. ?Spend less time sitting. Even light physical activity can be beneficial. ?Watch cholesterol and blood lipids ?Have your blood tested for lipids and cholesterol at 72 years of age, then have this test every 5 years. ?You may need to have your cholesterol levels checked more often if: ?Your lipid or cholesterol levels are high. ?You are older than 72 years of age. ?You are at high risk for heart disease. ?What should I know about cancer screening? ?Many types of cancers can be detected early and may often be prevented.  Depending on your health history and family history, you may need to have cancer screening at various ages. This may include screening for: ?Colorectal cancer. ?Prostate cancer. ?Skin cancer. ?Lung cancer. ?What should I know about heart disease, diabetes, and high blood pressure? ?Blood pressure and heart disease ?High blood pressure causes heart disease and increases the risk of stroke. This is more likely to develop in people who have high blood pressure readings or are overweight. ?Talk with your health care provider about your target blood pressure readings. ?Have your blood pressure checked: ?Every 3-5 years if you are 66-25 years of age. ?Every year if you are 14 years old or older. ?If you are between the ages of 74 and 51 and are a current or former smoker, ask your health care provider if you should have a one-time screening for abdominal aortic aneurysm (AAA). ?Diabetes ?Have regular diabetes screenings. This checks your fasting blood sugar level. Have the screening done: ?Once every three years after age 26 if you are at a normal weight and have a low risk for diabetes. ?More often and at a younger age if you are overweight or have a high risk for diabetes. ?What should I know about preventing infection? ?Hepatitis B ?If you have a higher risk for hepatitis B, you should be screened for this virus. Talk with your health care provider to find out if you are at risk for hepatitis B infection. ?Hepatitis C ?Blood testing is recommended for: ?Everyone born from [birth year] through 1966. ?Anyone with known  risk factors for hepatitis C. ?Sexually transmitted infections (STIs) ?You should be screened each year for STIs, including gonorrhea and chlamydia, if: ?You are sexually active and are younger than 72 years of age. ?You are older than 72 years of age and your health care provider tells you that you are at risk for this type of infection. ?Your sexual activity has changed since you were last screened, and you are  at increased risk for chlamydia or gonorrhea. Ask your health care provider if you are at risk. ?Ask your health care provider about whether you are at high risk for HIV. Your health care provider may recommend a prescription medicine to help prevent HIV infection. If you choose to take medicine to prevent HIV, you should first get tested for HIV. You should then be tested every 3 months for as long as you are taking the medicine. ?Follow these instructions at home: ?Alcohol use ?Do not drink alcohol if your health care provider tells you not to drink. ?If you drink alcohol: ?Limit how much you have to 0-2 drinks a day. ?Know how much alcohol is in your drink. In the [place] one drink equals one 12 oz bottle of beer (355 mL), one 5 oz glass of wine (148 mL), or one 1? oz glass of hard liquor (44 mL). ?Lifestyle ?Do not use any products that contain nicotine or tobacco. These products include cigarettes, chewing tobacco, and vaping devices, such as e-cigarettes. If you need help quitting, ask your health care provider. ?Do not use street drugs. ?Do not share needles. ?Ask your health care provider for help if you need support or information about quitting drugs. ?General instructions ?Schedule regular health, dental, and eye exams. ?Stay current with your vaccines. ?Tell your health care provider if: ?You often feel depressed. ?You have ever been abused or do not feel safe at home. ?Summary ?Adopting a healthy lifestyle and getting preventive care are important in promoting health and wellness. ?Follow your health care provider's instructions about healthy diet, exercising, and getting tested or screened for diseases. ?Follow your health care provider's instructions on monitoring your cholesterol and blood pressure. ?This information is not intended to replace advice given to you by your health care provider. Make sure you discuss any questions you have with your health care provider. ?Document Revised: 04/26/2021  Document Reviewed: 04/26/2021 ?Elsevier Patient Education ? [place]. ? ?

## 2022-02-20 NOTE — Progress Notes (Signed)
? ? ?Subjective:  ? ? Patient ID: Ryan Cobb, male    DOB: March 16, 1951, 71 y.o.   MRN: [id number] ? ? ?This visit occurred during the SARS-CoV-2 public health emergency.  Safety protocols were in place, including screening questions prior to the visit, additional usage of staff PPE, and extensive cleaning of exam room while observing appropriate contact time as indicated for disinfecting solutions. ? ? ?HPI ?Ryan Cobb is here for  ?Chief Complaint  ?Patient presents with  ? Annual Exam  ? ? ?Colonoscopy - last August  [facility] in [place].  Return in 3-5 years.  ? ? ? ?Medications and allergies reviewed with patient and updated if appropriate. ? ? ?Current Outpatient Medications on File Prior to Visit  ?Medication Sig Dispense Refill  ? b complex vitamins tablet Take 1 tablet by mouth daily.    ? Bioflavonoid Products (ESTER C PO) Take 500 mg by mouth daily.    ? calcium carbonate (OS-CAL) 600 MG TABS tablet Take 600 mg by mouth daily.    ? Cetirizine HCl (ZYRTEC ALLERGY PO) Take by mouth.    ? cholecalciferol (VITAMIN D) 1000 units tablet Take 5,000 Units by mouth 4 (four) times a week.    ? Coenzyme Q10 (CO Q 10) 10 MG CAPS Take 10 mg by mouth daily.     ? Garlic 0160 MG CAPS Take 1,000 mg by mouth daily.     ? lisinopril-hydrochlorothiazide (ZESTORETIC) 20-25 MG tablet TAKE 1 TABLET EVERY DAY 90 tablet 1  ? Magnesium 250 MG TABS Take 250 mg by mouth daily.     ? Multiple Vitamins-Minerals (CENTRUM SILVER PO) Take 1 tablet by mouth daily.     ? multivitamin-lutein (OCUVITE-LUTEIN) CAPS capsule Take 1 capsule by mouth daily.    ? naproxen sodium (ALEVE) 220 MG tablet Take 220 mg by mouth as needed.    ? Omega 3 1200 MG CAPS Take 1,200 mg by mouth daily.     ? Probiotic Product (PROBIOTIC DAILY PO) Take by mouth. 50,000 billion daily    ? Turmeric (QC TUMERIC COMPLEX PO) Take by mouth. Patient takes tumeric and ginger daily    ? vitamin E 180 MG (400 UNITS) capsule Take 400 Units by mouth. 3x's a week    ? Zinc  30 MG CAPS Take 30 mg by mouth daily.    ? montelukast (SINGULAIR) 10 MG tablet Take 1 tablet (10 mg total) by mouth at bedtime. (Patient not taking: Reported on 02/21/2022) 90 tablet 3  ? ?No current facility-administered medications on file prior to visit.  ? ? ?Review of Systems  ?Constitutional:  Negative for chills and fever.  ?HENT:  Positive for postnasal drip (allergy related).   ?Eyes:  Negative for visual disturbance.  ?Respiratory:  Negative for cough, shortness of breath and wheezing.   ?Cardiovascular:  Negative for chest pain, palpitations and leg swelling.  ?Gastrointestinal:  Negative for abdominal pain, blood in stool, constipation, diarrhea and nausea.  ?Genitourinary:  Negative for difficulty urinating, dysuria and hematuria.  ?Musculoskeletal:  Positive for arthralgias (mild). Negative for back pain.  ?Skin:  Negative for rash.  ?Neurological:  Negative for light-headedness and headaches.  ?Psychiatric/Behavioral:  Negative for dysphoric mood. The patient is not nervous/anxious.   ? ?   ?Objective:  ? ?Vitals:  ? 02/21/22 1305  ?BP: 134/84  ?Pulse: 69  ?Temp: 98.3 ?F (36.8 ?C)  ?SpO2: 97%  ? ?Filed Weights  ? 02/21/22 1305  ?Weight: 166 lb 9.6 oz (75.6  kg)  ? ?Body mass index is 26.09 kg/m?. ? ?BP Readings from Last 3 Encounters:  ?02/21/22 134/84  ?01/13/21 134/72  ?07/15/19 (!) 144/82  ? ? ?Wt Readings from Last 3 Encounters:  ?02/21/22 166 lb 9.6 oz (75.6 kg)  ?01/13/21 158 lb (71.7 kg)  ?09/10/20 155 lb (70.3 kg)  ? ? ?  ?Physical Exam ?Constitutional: He appears well-developed and well-nourished. No distress.  ?HENT:  ?Head: Normocephalic and atraumatic.  ?Right Ear: External ear normal.  ?Left Ear: External ear normal.  ?Mouth/Throat: Oropharynx is clear and moist.  ?Normal ear canals and TM b/l  ?Eyes: Conjunctivae and EOM are normal.  ?Neck: Neck supple. No tracheal deviation present. No thyromegaly present.  ?No carotid bruit  ?Cardiovascular: Normal rate, regular rhythm, normal heart  sounds and intact distal pulses.   ?No murmur heard. ?Pulmonary/Chest: Effort normal and breath sounds normal. No respiratory distress. He has no wheezes. He has no rales.  ?Abdominal: Soft. He exhibits no distension. There is no tenderness.  ?Genitourinary: deferred  ?Musculoskeletal: He exhibits no edema.  ?Lymphadenopathy:   He has no cervical adenopathy.  ?Skin: Skin is warm and dry. He is not diaphoretic.  ?Psychiatric: He has a normal mood and affect. His behavior is normal.  ? ? ? ? ?   ?Assessment & Plan:  ? ?Physical exam: ?Screening blood work  ordered ?Exercise   walking 4-5 times a week, stretch bands, cycling ?Weight  good ?Substance abuse   none ? ? ?Reviewed recommended immunizations. ? ? ?Health Maintenance  ?Topic Date Due  ? COLONOSCOPY (Pts 45-9yr Insurance coverage will need to be confirmed)  05/25/2020  ? INFLUENZA VACCINE  03/18/2022 (Originally 07/19/2021)  ? TETANUS/TDAP  06/28/2028  ? Pneumonia Vaccine 72 Years old  Completed  ? Hepatitis C Screening  Completed  ? Zoster Vaccines- Shingrix  Completed  ? HPV VACCINES  Aged Out  ? COVID-19 Vaccine  Discontinued  ? ? ? ?See Problem List for Assessment and Plan of chronic medical problems. ? ? ?

## 2022-02-21 ENCOUNTER — Ambulatory Visit (INDEPENDENT_AMBULATORY_CARE_PROVIDER_SITE_OTHER): Payer: Medicare PPO | Admitting: Internal Medicine

## 2022-02-21 ENCOUNTER — Other Ambulatory Visit: Payer: Self-pay

## 2022-02-21 VITALS — BP 134/84 | HR 69 | Temp 98.3°F | Ht 67.0 in | Wt 166.6 lb

## 2022-02-21 DIAGNOSIS — J309 Allergic rhinitis, unspecified: Secondary | ICD-10-CM | POA: Diagnosis not present

## 2022-02-21 DIAGNOSIS — I10 Essential (primary) hypertension: Secondary | ICD-10-CM

## 2022-02-21 DIAGNOSIS — Z0000 Encounter for general adult medical examination without abnormal findings: Secondary | ICD-10-CM | POA: Diagnosis not present

## 2022-02-21 DIAGNOSIS — R739 Hyperglycemia, unspecified: Secondary | ICD-10-CM

## 2022-02-21 LAB — CBC WITH DIFFERENTIAL/PLATELET
Basophils Absolute: 0.1 10*3/uL (ref 0.0–0.1)
Basophils Relative: 1.1 % (ref 0.0–3.0)
Eosinophils Absolute: 0.1 10*3/uL (ref 0.0–0.7)
Eosinophils Relative: 1.8 % (ref 0.0–5.0)
HCT: 41.8 % (ref 39.0–52.0)
Hemoglobin: 14.5 g/dL (ref 13.0–17.0)
Lymphocytes Relative: 27 % (ref 12.0–46.0)
Lymphs Abs: 1.8 10*3/uL (ref 0.7–4.0)
MCHC: 34.7 g/dL (ref 30.0–36.0)
MCV: 92.8 fl (ref 78.0–100.0)
Monocytes Absolute: 0.6 10*3/uL (ref 0.1–1.0)
Monocytes Relative: 9.7 % (ref 3.0–12.0)
Neutro Abs: 4 10*3/uL (ref 1.4–7.7)
Neutrophils Relative %: 60.4 % (ref 43.0–77.0)
Platelets: 218 10*3/uL (ref 150.0–400.0)
RBC: 4.51 Mil/uL (ref 4.22–5.81)
RDW: 13.1 % (ref 11.5–15.5)
WBC: 6.6 10*3/uL (ref 4.0–10.5)

## 2022-02-21 LAB — COMPREHENSIVE METABOLIC PANEL
ALT: 22 U/L (ref 0–53)
AST: 29 U/L (ref 0–37)
Albumin: 4.5 g/dL (ref 3.5–5.2)
Alkaline Phosphatase: 45 U/L (ref 39–117)
BUN: 12 mg/dL (ref 6–23)
CO2: 31 mEq/L (ref 19–32)
Calcium: 10 mg/dL (ref 8.4–10.5)
Chloride: 101 mEq/L (ref 96–112)
Creatinine, Ser: 0.91 mg/dL (ref 0.40–1.50)
GFR: 84.42 mL/min (ref >60.00)
Glucose, Bld: 97 mg/dL (ref 70–99)
Potassium: 3.8 mEq/L (ref 3.5–5.1)
Sodium: 140 mEq/L (ref 135–145)
Total Bilirubin: 0.6 mg/dL (ref 0.2–1.2)
Total Protein: 7.4 g/dL (ref 6.0–8.3)

## 2022-02-21 LAB — LIPID PANEL
Cholesterol: 178 mg/dL (ref 0–200)
HDL: 68.5 mg/dL (ref >39.00)
LDL Cholesterol: 94 mg/dL (ref 0–99)
NonHDL: 109.51
Total CHOL/HDL Ratio: 3
Triglycerides: 77 mg/dL (ref 0.0–149.0)
VLDL: 15.4 mg/dL (ref 0.0–40.0)

## 2022-02-21 LAB — TSH: TSH: 3.28 u[IU]/mL (ref 0.35–5.50)

## 2022-02-21 MED ORDER — LISINOPRIL-HYDROCHLOROTHIAZIDE 20-25 MG PO TABS: ORAL_TABLET | ORAL | 3 refills | Status: DC

## 2022-02-21 NOTE — Assessment & Plan Note (Signed)
Chronic ?Seasonal ?Not currently taking singulair - will let us know if he needs it ?

## 2022-02-21 NOTE — Assessment & Plan Note (Signed)
Chronic ?BP well controlled ?Continue lisinopril-hctz 20-25 mg daily ?cmp ? ?

## 2022-02-21 NOTE — Assessment & Plan Note (Signed)
Chronic Check a1c Low sugar / carb diet Stressed regular exercise  

## 2022-03-10 ENCOUNTER — Encounter: Payer: Self-pay | Admitting: Internal Medicine

## 2022-03-10 NOTE — Progress Notes (Signed)
Outside notes received. Information abstracted. Notes sent to scan.  

## 2022-05-17 ENCOUNTER — Telehealth: Payer: Self-pay | Admitting: Internal Medicine

## 2022-05-17 NOTE — Telephone Encounter (Signed)
FYI: INS did patients AWV earlier this year.

## 2022-12-07 DIAGNOSIS — L2389 Allergic contact dermatitis due to other agents: Secondary | ICD-10-CM | POA: Insufficient documentation

## 2023-04-26 ENCOUNTER — Encounter: Payer: Medicare PPO | Admitting: Internal Medicine

## 2023-05-23 ENCOUNTER — Encounter: Payer: Medicare PPO | Admitting: Internal Medicine

## 2023-06-01 ENCOUNTER — Encounter: Payer: Self-pay | Admitting: Internal Medicine

## 2023-06-01 NOTE — Progress Notes (Signed)
Subjective:    Patient ID: Ryan Cobb, male    DOB: Jun 13, 1951, 72 y.o.   MRN: [id number]     HPI Ryan Cobb is here for a physical exam and his chronic medical problems.   Doing well -  has lost of allergy - skin, environmental - saw an allergist and has seen a dermatologist.  On more meds for those.     Medications and allergies reviewed with patient and updated if appropriate.  Current Outpatient Medications on File Prior to Visit  Medication Sig Dispense Refill   b complex vitamins tablet Take 1 tablet by mouth daily.     Bioflavonoid Products (ESTER C PO) Take 500 mg by mouth daily.     calcium carbonate (OS-CAL) 600 MG TABS tablet Take 600 mg by mouth daily.     Cetirizine HCl (ZYRTEC ALLERGY PO) Take by mouth.     cholecalciferol (VITAMIN D) 1000 units tablet Take 5,000 Units by mouth 4 (four) times a week.     Coenzyme Q10 (CO Q 10) 10 MG CAPS Take 10 mg by mouth daily.      Garlic 1000 MG CAPS Take 1,000 mg by mouth daily.      lisinopril-hydrochlorothiazide (ZESTORETIC) 20-25 MG tablet TAKE 1 TABLET EVERY DAY 90 tablet 3   Magnesium 250 MG TABS Take 250 mg by mouth daily.      Multiple Vitamins-Minerals (CENTRUM SILVER PO) Take 1 tablet by mouth daily.      multivitamin-lutein (OCUVITE-LUTEIN) CAPS capsule Take 1 capsule by mouth daily.     naproxen sodium (ALEVE) 220 MG tablet Take 220 mg by mouth as needed.     Omega 3 1200 MG CAPS Take 1,200 mg by mouth daily.      Probiotic Product (PROBIOTIC DAILY PO) Take by mouth. 50,000 billion daily     Turmeric (QC TUMERIC COMPLEX PO) Take by mouth. Patient takes tumeric and ginger daily     vitamin E 180 MG (400 UNITS) capsule Take 400 Units by mouth. 3x's a week     Zinc 30 MG CAPS Take 30 mg by mouth daily.     No current facility-administered medications on file prior to visit.    Review of Systems  Constitutional:  Negative for fever.  Eyes:  Negative for visual disturbance.  Respiratory:  Negative for cough,  shortness of breath and wheezing.   Cardiovascular:  Negative for chest pain, palpitations and leg swelling.  Gastrointestinal:  Negative for abdominal pain, blood in stool, constipation and diarrhea.       No gerd  Genitourinary:  Negative for difficulty urinating and dysuria.  Musculoskeletal:  Positive for arthralgias (mild). Negative for back pain.  Skin:  Negative for rash.  Neurological:  Positive for headaches (rare). Negative for dizziness and light-headedness.  Psychiatric/Behavioral:  Negative for dysphoric mood. The patient is not nervous/anxious.        Objective:   Vitals:   06/02/23 1302  BP: 130/80  Pulse: 75  Temp: 98.6 F (37 C)  SpO2: 96%   Filed Weights   06/02/23 1302  Weight: 166 lb (75.3 kg)   Body mass index is 26 kg/m.  BP Readings from Last 3 Encounters:  06/02/23 130/80  02/21/22 134/84  01/13/21 134/72    Wt Readings from Last 3 Encounters:  06/02/23 166 lb (75.3 kg)  02/21/22 166 lb 9.6 oz (75.6 kg)  01/13/21 158 lb (71.7 kg)      Physical Exam Constitutional: He appears well-developed  and well-nourished. No distress.  HENT:  Head: Normocephalic and atraumatic.  Right Ear: External ear normal.  Left Ear: External ear normal.  Normal ear canals and TM b/l  Mouth/Throat: Oropharynx is clear and moist. Eyes: Conjunctivae and EOM are normal.  Neck: Neck supple. No tracheal deviation present. No thyromegaly present.  No carotid bruit  Cardiovascular: Normal rate, regular rhythm, normal heart sounds and intact distal pulses.   No murmur heard.  No lower extremity edema. Pulmonary/Chest: Effort normal and breath sounds normal. No respiratory distress. He has no wheezes. He has no rales.  Abdominal: Soft. He exhibits no distension. There is no tenderness.  Genitourinary: deferred  Lymphadenopathy:   He has no cervical adenopathy.  Skin: Skin is warm and dry. He is not diaphoretic.  Psychiatric: He has a normal mood and affect. His  behavior is normal.         Assessment & Plan:   Physical exam: Screening blood work  ordered Exercise     regular - yard work Weight   normal Substance abuse   none   Reviewed recommended immunizations.   Health Maintenance  Topic Date Due   Medicare Annual Wellness (AWV)  03/01/2022   INFLUENZA VACCINE  07/20/2023   Colonoscopy  05/28/2026   DTaP/Tdap/Td (3 - Td or Tdap) 06/28/2028   Pneumonia Vaccine 2+ Years old  Completed   Hepatitis C Screening  Completed   Zoster Vaccines- Shingrix  Completed   HPV VACCINES  Aged Out   COVID-19 Vaccine  Discontinued     See Problem List for Assessment and Plan of chronic medical problems.

## 2023-06-01 NOTE — Patient Instructions (Addendum)
Blood work was ordered.   The lab is on the first floor.    Medications changes include :   none      Return in about 1 year (around 06/01/2024) for Physical Exam.   Health Maintenance, Male Adopting a healthy lifestyle and getting preventive care are important in promoting health and wellness. Ask your health care provider about: The right schedule for you to have regular tests and exams. Things you can do on your own to prevent diseases and keep yourself healthy. What should I know about diet, weight, and exercise? Eat a healthy diet  Eat a diet that includes plenty of vegetables, fruits, low-fat dairy products, and lean protein. Do not eat a lot of foods that are high in solid fats, added sugars, or sodium. Maintain a healthy weight Body mass index (BMI) is a measurement that can be used to identify possible weight problems. It estimates body fat based on height and weight. Your health care provider can help determine your BMI and help you achieve or maintain a healthy weight. Get regular exercise Get regular exercise. This is one of the most important things you can do for your health. Most adults should: Exercise for at least 150 minutes each week. The exercise should increase your heart rate and make you sweat (moderate-intensity exercise). Do strengthening exercises at least twice a week. This is in addition to the moderate-intensity exercise. Spend less time sitting. Even light physical activity can be beneficial. Watch cholesterol and blood lipids Have your blood tested for lipids and cholesterol at 73 years of age, then have this test every 5 years. You may need to have your cholesterol levels checked more often if: Your lipid or cholesterol levels are high. You are older than 73 years of age. You are at high risk for heart disease. What should I know about cancer screening? Many types of cancers can be detected early and may often be prevented. Depending on  your health history and family history, you may need to have cancer screening at various ages. This may include screening for: Colorectal cancer. Prostate cancer. Skin cancer. Lung cancer. What should I know about heart disease, diabetes, and high blood pressure? Blood pressure and heart disease High blood pressure causes heart disease and increases the risk of stroke. This is more likely to develop in people who have high blood pressure readings or are overweight. Talk with your health care provider about your target blood pressure readings. Have your blood pressure checked: Every 3-5 years if you are 21-59 years of age. Every year if you are 70 years old or older. If you are between the ages of 11 and 75 and are a current or former smoker, ask your health care provider if you should have a one-time screening for abdominal aortic aneurysm (AAA). Diabetes Have regular diabetes screenings. This checks your fasting blood sugar level. Have the screening done: Once every three years after age 92 if you are at a normal weight and have a low risk for diabetes. More often and at a younger age if you are overweight or have a high risk for diabetes. What should I know about preventing infection? Hepatitis B If you have a higher risk for hepatitis B, you should be screened for this virus. Talk with your health care provider to find out if you are at risk for hepatitis B infection. Hepatitis C Blood testing is recommended for: Everyone born from [birth year] through 1965. Anyone with known  risk factors for hepatitis C. Sexually transmitted infections (STIs) You should be screened each year for STIs, including gonorrhea and chlamydia, if: You are sexually active and are younger than 74 years of age. You are older than 73 years of age and your health care provider tells you that you are at risk for this type of infection. Your sexual activity has changed since you were last screened, and you are at increased  risk for chlamydia or gonorrhea. Ask your health care provider if you are at risk. Ask your health care provider about whether you are at high risk for HIV. Your health care provider may recommend a prescription medicine to help prevent HIV infection. If you choose to take medicine to prevent HIV, you should first get tested for HIV. You should then be tested every 3 months for as long as you are taking the medicine. Follow these instructions at home: Alcohol use Do not drink alcohol if your health care provider tells you not to drink. If you drink alcohol: Limit how much you have to 0-2 drinks a day. Know how much alcohol is in your drink. In the [place] one drink equals one 12 oz bottle of beer (355 mL), one 5 oz glass of wine (148 mL), or one 1 oz glass of hard liquor (44 mL). Lifestyle Do not use any products that contain nicotine or tobacco. These products include cigarettes, chewing tobacco, and vaping devices, such as e-cigarettes. If you need help quitting, ask your health care provider. Do not use street drugs. Do not share needles. Ask your health care provider for help if you need support or information about quitting drugs. General instructions Schedule regular health, dental, and eye exams. Stay current with your vaccines. Tell your health care provider if: You often feel depressed. You have ever been abused or do not feel safe at home. Summary Adopting a healthy lifestyle and getting preventive care are important in promoting health and wellness. Follow your health care provider's instructions about healthy diet, exercising, and getting tested or screened for diseases. Follow your health care provider's instructions on monitoring your cholesterol and blood pressure. This information is not intended to replace advice given to you by your health care provider. Make sure you discuss any questions you have with your health care provider. Document Revised: 04/26/2021 Document  Reviewed: 04/26/2021 Elsevier Patient Education  2024 [employer].

## 2023-06-02 ENCOUNTER — Ambulatory Visit (INDEPENDENT_AMBULATORY_CARE_PROVIDER_SITE_OTHER): Payer: Medicare PPO | Admitting: Internal Medicine

## 2023-06-02 VITALS — BP 130/80 | HR 75 | Temp 98.6°F | Ht 67.0 in | Wt 166.0 lb

## 2023-06-02 DIAGNOSIS — Z0000 Encounter for general adult medical examination without abnormal findings: Secondary | ICD-10-CM

## 2023-06-02 DIAGNOSIS — R739 Hyperglycemia, unspecified: Secondary | ICD-10-CM

## 2023-06-02 DIAGNOSIS — I10 Essential (primary) hypertension: Secondary | ICD-10-CM | POA: Diagnosis not present

## 2023-06-02 LAB — COMPREHENSIVE METABOLIC PANEL
ALT: 26 U/L (ref 0–53)
AST: 32 U/L (ref 0–37)
Albumin: 4.6 g/dL (ref 3.5–5.2)
Alkaline Phosphatase: 60 U/L (ref 39–117)
BUN: 17 mg/dL (ref 6–23)
CO2: 31 mEq/L (ref 19–32)
Calcium: 10.2 mg/dL (ref 8.4–10.5)
Chloride: 97 mEq/L (ref 96–112)
Creatinine, Ser: 0.98 mg/dL (ref 0.40–1.50)
GFR: 76.54 mL/min (ref >60.00)
Glucose, Bld: 79 mg/dL (ref 70–99)
Potassium: 3.7 mEq/L (ref 3.5–5.1)
Sodium: 138 mEq/L (ref 135–145)
Total Bilirubin: 0.7 mg/dL (ref 0.2–1.2)
Total Protein: 8 g/dL (ref 6.0–8.3)

## 2023-06-02 LAB — LIPID PANEL
Cholesterol: 192 mg/dL (ref 0–200)
HDL: 68.4 mg/dL (ref >39.00)
LDL Cholesterol: 101 mg/dL — ABNORMAL HIGH (ref 0–99)
NonHDL: 123.37
Total CHOL/HDL Ratio: 3
Triglycerides: 110 mg/dL (ref 0.0–149.0)
VLDL: 22 mg/dL (ref 0.0–40.0)

## 2023-06-02 LAB — CBC WITH DIFFERENTIAL/PLATELET
Basophils Absolute: 0.1 10*3/uL (ref 0.0–0.1)
Basophils Relative: 1 % (ref 0.0–3.0)
Eosinophils Absolute: 0.3 10*3/uL (ref 0.0–0.7)
Eosinophils Relative: 3.7 % (ref 0.0–5.0)
HCT: 44.7 % (ref 39.0–52.0)
Hemoglobin: 15.1 g/dL (ref 13.0–17.0)
Lymphocytes Relative: 22.8 % (ref 12.0–46.0)
Lymphs Abs: 2 10*3/uL (ref 0.7–4.0)
MCHC: 33.8 g/dL (ref 30.0–36.0)
MCV: 92.9 fl (ref 78.0–100.0)
Monocytes Absolute: 0.8 10*3/uL (ref 0.1–1.0)
Monocytes Relative: 9.1 % (ref 3.0–12.0)
Neutro Abs: 5.5 10*3/uL (ref 1.4–7.7)
Neutrophils Relative %: 63.4 % (ref 43.0–77.0)
Platelets: 237 10*3/uL (ref 150.0–400.0)
RBC: 4.81 Mil/uL (ref 4.22–5.81)
RDW: 13.5 % (ref 11.5–15.5)
WBC: 8.7 10*3/uL (ref 4.0–10.5)

## 2023-06-02 LAB — HEMOGLOBIN A1C: Hgb A1c MFr Bld: 5.5 % (ref 4.6–6.5)

## 2023-06-02 LAB — TSH: TSH: 2.48 u[IU]/mL (ref 0.35–5.50)

## 2023-06-02 MED ORDER — LISINOPRIL-HYDROCHLOROTHIAZIDE 20-25 MG PO TABS: ORAL_TABLET | ORAL | 3 refills | Status: DC

## 2023-06-02 NOTE — Assessment & Plan Note (Signed)
Chronic BP well controlled Continue lisinopril-hctz 20-25 mg daily cmp, CBC, lipid, TSH

## 2023-06-02 NOTE — Assessment & Plan Note (Signed)
Chronic Check a1c Low sugar / carb diet Stressed regular exercise  

## 2023-06-16 ENCOUNTER — Encounter: Payer: Medicare PPO | Admitting: Internal Medicine

## 2024-05-21 ENCOUNTER — Other Ambulatory Visit: Payer: Self-pay | Admitting: Internal Medicine

## 2024-08-09 ENCOUNTER — Encounter: Admitting: Internal Medicine

## 2024-08-12 ENCOUNTER — Encounter: Payer: Self-pay | Admitting: Internal Medicine

## 2024-08-12 NOTE — Patient Instructions (Addendum)
 BP goal < 130/80.     Blood work was ordered.       Medications changes include :   cialis    [employer]    Return in about 1 year (around 08/13/2025) for Physical Exam.   Health Maintenance, Male Adopting a healthy lifestyle and getting preventive care are important in promoting health and wellness. Ask your health care provider about: The right schedule for you to have regular tests and exams. Things you can do on your own to prevent diseases and keep yourself healthy. What should I know about diet, weight, and exercise? Eat a healthy diet  Eat a diet that includes plenty of vegetables, fruits, low-fat dairy products, and lean protein. Do not eat a lot of foods that are high in solid fats, added sugars, or sodium. Maintain a healthy weight Body mass index (BMI) is a measurement that can be used to identify possible weight problems. It estimates body fat based on height and weight. Your health care provider can help determine your BMI and help you achieve or maintain a healthy weight. Get regular exercise Get regular exercise. This is one of the most important things you can do for your health. Most adults should: Exercise for at least 150 minutes each week. The exercise should increase your heart rate and make you sweat (moderate-intensity exercise). Do strengthening exercises at least twice a week. This is in addition to the moderate-intensity exercise. Spend less time sitting. Even light physical activity can be beneficial. Watch cholesterol and blood lipids Have your blood tested for lipids and cholesterol at 74 years of age, then have this test every 5 years. You may need to have your cholesterol levels checked more often if: Your lipid or cholesterol levels are high. You are older than 74 years of age. You are at high risk for heart disease. What should I know about cancer screening? Many types of cancers can be detected early and may often be  prevented. Depending on your health history and family history, you may need to have cancer screening at various ages. This may include screening for: Colorectal cancer. Prostate cancer. Skin cancer. Lung cancer. What should I know about heart disease, diabetes, and high blood pressure? Blood pressure and heart disease High blood pressure causes heart disease and increases the risk of stroke. This is more likely to develop in people who have high blood pressure readings or are overweight. Talk with your health care provider about your target blood pressure readings. Have your blood pressure checked: Every 3-5 years if you are 71-56 years of age. Every year if you are 44 years old or older. If you are between the ages of 89 and 35 and are a current or former smoker, ask your health care provider if you should have a one-time screening for abdominal aortic aneurysm (AAA). Diabetes Have regular diabetes screenings. This checks your fasting blood sugar level. Have the screening done: Once every three years after age 59 if you are at a normal weight and have a low risk for diabetes. More often and at a younger age if you are overweight or have a high risk for diabetes. What should I know about preventing infection? Hepatitis B If you have a higher risk for hepatitis B, you should be screened for this virus. Talk with your health care provider to find out if you are at risk for hepatitis B infection. Hepatitis C Blood testing is recommended for: Everyone born from [birth year]  through 1965. Anyone with known risk factors for hepatitis C. Sexually transmitted infections (STIs) You should be screened each year for STIs, including gonorrhea and chlamydia, if: You are sexually active and are younger than 74 years of age. You are older than 74 years of age and your health care provider tells you that you are at risk for this type of infection. Your sexual activity has changed since you were last screened,  and you are at increased risk for chlamydia or gonorrhea. Ask your health care provider if you are at risk. Ask your health care provider about whether you are at high risk for HIV. Your health care provider may recommend a prescription medicine to help prevent HIV infection. If you choose to take medicine to prevent HIV, you should first get tested for HIV. You should then be tested every 3 months for as long as you are taking the medicine. Follow these instructions at home: Alcohol use Do not drink alcohol if your health care provider tells you not to drink. If you drink alcohol: Limit how much you have to 0-2 drinks a day. Know how much alcohol is in your drink. In the [place] one drink equals one 12 oz bottle of beer (355 mL), one 5 oz glass of wine (148 mL), or one 1 oz glass of hard liquor (44 mL). Lifestyle Do not use any products that contain nicotine or tobacco. These products include cigarettes, chewing tobacco, and vaping devices, such as e-cigarettes. If you need help quitting, ask your health care provider. Do not use street drugs. Do not share needles. Ask your health care provider for help if you need support or information about quitting drugs. General instructions Schedule regular health, dental, and eye exams. Stay current with your vaccines. Tell your health care provider if: You often feel depressed. You have ever been abused or do not feel safe at home. Summary Adopting a healthy lifestyle and getting preventive care are important in promoting health and wellness. Follow your health care provider's instructions about healthy diet, exercising, and getting tested or screened for diseases. Follow your health care provider's instructions on monitoring your cholesterol and blood pressure. This information is not intended to replace advice given to you by your health care provider. Make sure you discuss any questions you have with your health care provider. Document Revised:  04/26/2021 Document Reviewed: 04/26/2021 Elsevier Patient Education  2024 [employer].

## 2024-08-12 NOTE — Progress Notes (Unsigned)
 Subjective:    Patient ID: Ryan Cobb, male    DOB: 1951/10/03, 73 y.o.   MRN: [id number]     HPI Nicandro is here for a physical exam and his chronic medical problems.  Has umbilical hernia x 6-8 weeks ago and ventral hernia, which is not new, but more pronounced.  A little discomfort across his upper abdomen.  Feels like a pressure feeling.   Straightening up causes discomfort.    Was not taking bp medicaiton religously at the same time every day for a while, but now taking it daily first thing in the morning.  He does not monitor his BP at home.   Medications and allergies reviewed with patient and updated if appropriate.  Current Outpatient Medications on File Prior to Visit  Medication Sig Dispense Refill   b complex vitamins tablet Take 1 tablet by mouth daily.     Bioflavonoid Products (ESTER C PO) Take 500 mg by mouth daily.     calcium  carbonate (OS-CAL) 600 MG TABS tablet Take 600 mg by mouth daily.     Cetirizine HCl (ZYRTEC ALLERGY PO) Take by mouth.     cholecalciferol  (VITAMIN D ) 1000 units tablet Take 5,000 Units by mouth 4 (four) times a week.     Coenzyme Q10 (CO Q 10) 10 MG CAPS Take 10 mg by mouth daily.      Garlic 1000 MG CAPS Take 1,000 mg by mouth daily.      lisinopril -hydrochlorothiazide  (ZESTORETIC ) 20-25 MG tablet Take 1 tablet by mouth once daily 90 tablet 0   Magnesium 250 MG TABS Take 250 mg by mouth daily.      Multiple Vitamins-Minerals (CENTRUM SILVER PO) Take 1 tablet by mouth daily.      multivitamin-lutein (OCUVITE-LUTEIN) CAPS capsule Take 1 capsule by mouth daily.     naproxen sodium (ALEVE) 220 MG tablet Take 220 mg by mouth as needed.     Omega 3 1200 MG CAPS Take 1,200 mg by mouth daily.      Probiotic Product (PROBIOTIC DAILY PO) Take by mouth. 50,000 billion daily     Turmeric (QC TUMERIC COMPLEX PO) Take by mouth. Patient takes tumeric and ginger daily     vitamin E 180 MG (400 UNITS) capsule Take 400 Units by mouth. 3x's a week      Zinc  30 MG CAPS Take 30 mg by mouth daily.     No current facility-administered medications on file prior to visit.    Review of Systems  Constitutional:  Negative for fever.  HENT:  Positive for postnasal drip.   Eyes:  Negative for visual disturbance.  Respiratory:  Positive for cough (from allergies). Negative for shortness of breath and wheezing.   Cardiovascular:  Negative for chest pain, palpitations and leg swelling.  Gastrointestinal:  Positive for abdominal pain (? muscular) and diarrhea (occ - related to what he eats). Negative for blood in stool and constipation.       No gerd  Genitourinary:  Negative for difficulty urinating, dysuria and hematuria.       ED  Musculoskeletal:  Positive for arthralgias, back pain and neck pain.  Skin:  Negative for rash.  Neurological:  Positive for headaches (occ -- mild from rigth shoulder muscle tension). Negative for dizziness and light-headedness.  Psychiatric/Behavioral:  Negative for dysphoric mood and sleep disturbance. The patient is not nervous/anxious.        Objective:   Vitals:   08/13/24 1446  BP: (!) 140/78  Pulse: 76  Temp: 98 F (36.7 C)  SpO2: 96%   Filed Weights   08/13/24 1446  Weight: 166 lb (75.3 kg)   Body mass index is 26 kg/m.  BP Readings from Last 3 Encounters:  08/13/24 (!) 140/78  06/02/23 130/80  02/21/22 134/84    Wt Readings from Last 3 Encounters:  08/13/24 166 lb (75.3 kg)  06/02/23 166 lb (75.3 kg)  02/21/22 166 lb 9.6 oz (75.6 kg)      Physical Exam Constitutional: He appears well-developed and well-nourished. No distress.  HENT:  Head: Normocephalic and atraumatic.  Right Ear: External ear normal.  Left Ear: External ear normal.  Normal ear canals and TM b/l  Mouth/Throat: Oropharynx is clear and moist. Eyes: Conjunctivae and EOM are normal.  Neck: Neck supple. No tracheal deviation present. No thyromegaly present.  No carotid bruit  Cardiovascular: Normal rate, regular  rhythm, normal heart sounds and intact distal pulses.   No murmur heard.  No lower extremity edema. Pulmonary/Chest: Effort normal and breath sounds normal. No respiratory distress. He has no wheezes. He has no rales.  Abdominal: Soft. Ventral and umbilical hernia - non-tender.  He exhibits no distension. There is no tenderness.  Genitourinary: deferred  Lymphadenopathy:   He has no cervical adenopathy.  Skin: Skin is warm and dry. He is not diaphoretic.  Psychiatric: He has a normal mood and affect. His behavior is normal.         Assessment & Plan:   Physical exam: Screening blood work  ordered Exercise   active,.golf Weight  is good Substance abuse   -he does state he probably drinks too much alcohol-discussed trying to cut down and not drink on a nightly basis. Eye exam up to date  Reviewed recommended immunizations.   Health Maintenance  Topic Date Due   Medicare Annual Wellness (AWV)  03/01/2022   INFLUENZA VACCINE  03/18/2025 (Originally 07/19/2024)   Colonoscopy  05/28/2026   DTaP/Tdap/Td (3 - Td or Tdap) 06/28/2028   Pneumococcal Vaccine: 50+ Years  Completed   Hepatitis C Screening  Completed   Zoster Vaccines- Shingrix  Completed   HPV VACCINES  Aged Out   Meningococcal B Vaccine  Aged Out   COVID-19 Vaccine  Discontinued     See Problem List for Assessment and Plan of chronic medical problems.

## 2024-08-13 ENCOUNTER — Ambulatory Visit (INDEPENDENT_AMBULATORY_CARE_PROVIDER_SITE_OTHER): Admitting: Internal Medicine

## 2024-08-13 VITALS — BP 140/78 | HR 76 | Temp 98.0°F | Ht 67.0 in | Wt 166.0 lb

## 2024-08-13 DIAGNOSIS — R739 Hyperglycemia, unspecified: Secondary | ICD-10-CM

## 2024-08-13 DIAGNOSIS — K429 Umbilical hernia without obstruction or gangrene: Secondary | ICD-10-CM

## 2024-08-13 DIAGNOSIS — I10 Essential (primary) hypertension: Secondary | ICD-10-CM

## 2024-08-13 DIAGNOSIS — K439 Ventral hernia without obstruction or gangrene: Secondary | ICD-10-CM

## 2024-08-13 DIAGNOSIS — Z0001 Encounter for general adult medical examination with abnormal findings: Secondary | ICD-10-CM

## 2024-08-13 DIAGNOSIS — Z0000 Encounter for general adult medical examination without abnormal findings: Secondary | ICD-10-CM

## 2024-08-13 LAB — COMPREHENSIVE METABOLIC PANEL WITH GFR
ALT: 18 U/L (ref 0–53)
AST: 27 U/L (ref 0–37)
Albumin: 4.5 g/dL (ref 3.5–5.2)
Alkaline Phosphatase: 54 U/L (ref 39–117)
BUN: 14 mg/dL (ref 6–23)
CO2: 30 meq/L (ref 19–32)
Calcium: 9.5 mg/dL (ref 8.4–10.5)
Chloride: 97 meq/L (ref 96–112)
Creatinine, Ser: 0.86 mg/dL (ref 0.40–1.50)
GFR: 85.23 mL/min (ref >60.00)
Glucose, Bld: 87 mg/dL (ref 70–99)
Potassium: 3.3 meq/L — ABNORMAL LOW (ref 3.5–5.1)
Sodium: 140 meq/L (ref 135–145)
Total Bilirubin: 0.9 mg/dL (ref 0.2–1.2)
Total Protein: 8 g/dL (ref 6.0–8.3)

## 2024-08-13 LAB — CBC WITH DIFFERENTIAL/PLATELET
Basophils Absolute: 0.1 K/uL (ref 0.0–0.1)
Basophils Relative: 0.7 % (ref 0.0–3.0)
Eosinophils Absolute: 0.2 K/uL (ref 0.0–0.7)
Eosinophils Relative: 2.2 % (ref 0.0–5.0)
HCT: 45.3 % (ref 39.0–52.0)
Hemoglobin: 15.4 g/dL (ref 13.0–17.0)
Lymphocytes Relative: 22.6 % (ref 12.0–46.0)
Lymphs Abs: 2.1 K/uL (ref 0.7–4.0)
MCHC: 34 g/dL (ref 30.0–36.0)
MCV: 93.3 fl (ref 78.0–100.0)
Monocytes Absolute: 0.8 K/uL (ref 0.1–1.0)
Monocytes Relative: 8.6 % (ref 3.0–12.0)
Neutro Abs: 6 K/uL (ref 1.4–7.7)
Neutrophils Relative %: 65.9 % (ref 43.0–77.0)
Platelets: 234 K/uL (ref 150.0–400.0)
RBC: 4.86 Mil/uL (ref 4.22–5.81)
RDW: 13.3 % (ref 11.5–15.5)
WBC: 9.2 K/uL (ref 4.0–10.5)

## 2024-08-13 LAB — LIPID PANEL
Cholesterol: 203 mg/dL — ABNORMAL HIGH (ref 0–200)
HDL: 81.9 mg/dL (ref >39.00)
LDL Cholesterol: 107 mg/dL — ABNORMAL HIGH (ref 0–99)
NonHDL: 121.01
Total CHOL/HDL Ratio: 2
Triglycerides: 70 mg/dL (ref 0.0–149.0)
VLDL: 14 mg/dL (ref 0.0–40.0)

## 2024-08-13 LAB — TSH: TSH: 2.68 u[IU]/mL (ref 0.35–5.50)

## 2024-08-13 LAB — HEMOGLOBIN A1C: Hgb A1c MFr Bld: 5.8 % (ref 4.6–6.5)

## 2024-08-13 MED ORDER — TADALAFIL 20 MG PO TABS: 10.0000 mg | ORAL_TABLET | ORAL | 11 refills | Status: AC | PRN

## 2024-08-13 MED ORDER — LISINOPRIL-HYDROCHLOROTHIAZIDE 20-25 MG PO TABS: 1.0000 | ORAL_TABLET | Freq: Every day | ORAL | 3 refills | Status: DC

## 2024-08-13 NOTE — Assessment & Plan Note (Addendum)
 Chronic BP?  Ideally controlled in office Advised him to monitor BP at home regularly-goal less than 130/80 Continue lisinopril -hctz 20-25 mg daily Will adjust medication if not well-controlled at home cmp, CBC, lipid, TSH  EKG today: NSR at 72 bpm, possible LAE, possible LVH with repolarization abnormality.  Compared to previous EKG there is no significant change.

## 2024-08-13 NOTE — Assessment & Plan Note (Signed)
 Chronic Denies any tenderness Monitor only

## 2024-08-13 NOTE — Assessment & Plan Note (Signed)
 Chronic Lab Results  Component Value Date   HGBA1C 5.5 06/02/2023   Check a1c Low sugar / carb diet Stressed regular exercise

## 2024-08-13 NOTE — Assessment & Plan Note (Signed)
 New Nontender Advised to just monitor and if there is any tenderness would consider surgery

## 2024-08-15 ENCOUNTER — Ambulatory Visit: Payer: Self-pay | Admitting: Internal Medicine

## 2024-08-29 ENCOUNTER — Encounter: Payer: Self-pay | Admitting: Internal Medicine

## 2024-08-29 DIAGNOSIS — I10 Essential (primary) hypertension: Secondary | ICD-10-CM

## 2024-08-29 DIAGNOSIS — Z136 Encounter for screening for cardiovascular disorders: Secondary | ICD-10-CM

## 2024-09-01 MED ORDER — LISINOPRIL 40 MG PO TABS: 40.0000 mg | ORAL_TABLET | Freq: Every day | ORAL | 3 refills | Status: AC

## 2024-09-01 MED ORDER — HYDROCHLOROTHIAZIDE 25 MG PO TABS: 25.0000 mg | ORAL_TABLET | Freq: Every day | ORAL | 3 refills | Status: AC

## 2024-09-01 NOTE — Addendum Note (Signed)
 Addended by: GEOFM GLADE PARAS on: 09/01/2024 02:30 PM   Modules accepted: Orders

## 2024-10-21 ENCOUNTER — Ambulatory Visit: Payer: Self-pay | Admitting: Internal Medicine

## 2024-10-21 ENCOUNTER — Other Ambulatory Visit (INDEPENDENT_AMBULATORY_CARE_PROVIDER_SITE_OTHER)

## 2024-10-21 DIAGNOSIS — I2510 Atherosclerotic heart disease of native coronary artery without angina pectoris: Secondary | ICD-10-CM

## 2024-10-21 DIAGNOSIS — I10 Essential (primary) hypertension: Secondary | ICD-10-CM | POA: Diagnosis not present

## 2024-10-21 LAB — BASIC METABOLIC PANEL WITH GFR
BUN: 15 mg/dL (ref 6–23)
CO2: 30 meq/L (ref 19–32)
Calcium: 9.3 mg/dL (ref 8.4–10.5)
Chloride: 101 meq/L (ref 96–112)
Creatinine, Ser: 0.98 mg/dL (ref 0.40–1.50)
GFR: 75.8 mL/min (ref >60.00)
Glucose, Bld: 96 mg/dL (ref 70–99)
Potassium: 3.9 meq/L (ref 3.5–5.1)
Sodium: 140 meq/L (ref 135–145)

## 2024-10-22 ENCOUNTER — Encounter (HOSPITAL_COMMUNITY): Payer: Self-pay

## 2024-10-22 ENCOUNTER — Ambulatory Visit (HOSPITAL_COMMUNITY)
Admission: RE | Admit: 2024-10-22 | Discharge: 2024-10-22 | Disposition: A | Discharge status: Home / Self Care | Payer: Self-pay | Source: Ambulatory Visit | Attending: Internal Medicine | Admitting: Internal Medicine

## 2024-10-22 DIAGNOSIS — Z136 Encounter for screening for cardiovascular disorders: Secondary | ICD-10-CM

## 2024-10-29 DIAGNOSIS — I2510 Atherosclerotic heart disease of native coronary artery without angina pectoris: Secondary | ICD-10-CM | POA: Insufficient documentation

## 2024-10-29 MED ORDER — ROSUVASTATIN CALCIUM 5 MG PO TABS: 5.0000 mg | ORAL_TABLET | Freq: Every day | ORAL | 3 refills | Status: AC

## 2024-12-02 ENCOUNTER — Encounter: Payer: Self-pay | Admitting: Internal Medicine

## 2024-12-06 ENCOUNTER — Other Ambulatory Visit

## 2024-12-06 DIAGNOSIS — I2510 Atherosclerotic heart disease of native coronary artery without angina pectoris: Secondary | ICD-10-CM | POA: Diagnosis not present

## 2024-12-06 LAB — HEPATIC FUNCTION PANEL
ALT: 23 U/L (ref 3–53)
AST: 32 U/L (ref 5–37)
Albumin: 4.2 g/dL (ref 3.5–5.2)
Alkaline Phosphatase: 55 U/L (ref 39–117)
Bilirubin, Direct: 0.2 mg/dL (ref 0.1–0.3)
Total Bilirubin: 0.7 mg/dL (ref 0.2–1.2)
Total Protein: 7.2 g/dL (ref 6.0–8.3)

## 2024-12-06 LAB — LIPID PANEL
Cholesterol: 152 mg/dL (ref 28–200)
HDL: 76.2 mg/dL
LDL Cholesterol: 64 mg/dL (ref 10–99)
NonHDL: 75.65
Total CHOL/HDL Ratio: 2
Triglycerides: 58 mg/dL (ref 10.0–149.0)
VLDL: 11.6 mg/dL (ref 0.0–40.0)

## 2024-12-08 ENCOUNTER — Ambulatory Visit: Payer: Self-pay | Admitting: Internal Medicine

## 2025-01-30 ENCOUNTER — Ambulatory Visit: Admitting: Internal Medicine
